# Patient Record
Sex: Female | Born: 1950 | Race: White | Hispanic: No | Marital: Married | Smoking: Never smoker
Health system: Southern US, Community
[De-identification: ages and names within clinical notes are randomized; demographics above are authoritative.]

## PROBLEM LIST (undated history)

## (undated) DIAGNOSIS — F329 Major depressive disorder, single episode, unspecified: Secondary | ICD-10-CM

## (undated) DIAGNOSIS — G8929 Other chronic pain: Secondary | ICD-10-CM

## (undated) DIAGNOSIS — T8859XA Other complications of anesthesia, initial encounter: Secondary | ICD-10-CM

## (undated) DIAGNOSIS — D869 Sarcoidosis, unspecified: Secondary | ICD-10-CM

## (undated) DIAGNOSIS — C801 Malignant (primary) neoplasm, unspecified: Secondary | ICD-10-CM

## (undated) DIAGNOSIS — K219 Gastro-esophageal reflux disease without esophagitis: Secondary | ICD-10-CM

## (undated) DIAGNOSIS — R51 Headache: Secondary | ICD-10-CM

## (undated) DIAGNOSIS — G43909 Migraine, unspecified, not intractable, without status migrainosus: Secondary | ICD-10-CM

## (undated) DIAGNOSIS — R112 Nausea with vomiting, unspecified: Secondary | ICD-10-CM

## (undated) DIAGNOSIS — R519 Headache, unspecified: Secondary | ICD-10-CM

## (undated) DIAGNOSIS — E7800 Pure hypercholesterolemia, unspecified: Secondary | ICD-10-CM

## (undated) DIAGNOSIS — K449 Diaphragmatic hernia without obstruction or gangrene: Secondary | ICD-10-CM

## (undated) DIAGNOSIS — R011 Cardiac murmur, unspecified: Secondary | ICD-10-CM

## (undated) DIAGNOSIS — Z9889 Other specified postprocedural states: Secondary | ICD-10-CM

## (undated) DIAGNOSIS — F32A Depression, unspecified: Secondary | ICD-10-CM

## (undated) HISTORY — DX: Gastro-esophageal reflux disease without esophagitis: K21.9

## (undated) HISTORY — DX: Pure hypercholesterolemia, unspecified: E78.00

## (undated) HISTORY — DX: Sarcoidosis, unspecified: D86.9

## (undated) HISTORY — PX: PARTIAL HYSTERECTOMY: SHX80

## (undated) HISTORY — DX: Depression, unspecified: F32.A

## (undated) HISTORY — DX: Gastro-esophageal reflux disease without esophagitis: K44.9

## (undated) HISTORY — DX: Migraine, unspecified, not intractable, without status migrainosus: G43.909

## (undated) HISTORY — PX: ABDOMINAL HYSTERECTOMY: SHX81

## (undated) HISTORY — DX: Headache: R51

## (undated) HISTORY — DX: Major depressive disorder, single episode, unspecified: F32.9

## (undated) HISTORY — DX: Other chronic pain: G89.29

## (undated) HISTORY — PX: OTHER SURGICAL HISTORY: SHX169

## (undated) HISTORY — DX: Headache, unspecified: R51.9

## (undated) HISTORY — PX: EXCISION VAGINAL CYST: SHX5825

---

## 2004-01-10 ENCOUNTER — Ambulatory Visit: Payer: Self-pay | Admitting: Internal Medicine

## 2004-01-24 ENCOUNTER — Ambulatory Visit: Payer: Self-pay | Admitting: Internal Medicine

## 2004-03-15 ENCOUNTER — Ambulatory Visit (HOSPITAL_COMMUNITY): Admission: RE | Admit: 2004-03-15 | Discharge: 2004-03-16 | Payer: Self-pay | Admitting: Cardiothoracic Surgery

## 2004-04-29 ENCOUNTER — Ambulatory Visit: Payer: Self-pay | Admitting: Internal Medicine

## 2004-11-18 ENCOUNTER — Ambulatory Visit: Payer: Self-pay | Admitting: Internal Medicine

## 2005-01-16 ENCOUNTER — Ambulatory Visit: Payer: Self-pay | Admitting: Internal Medicine

## 2005-12-15 ENCOUNTER — Encounter: Payer: Self-pay | Admitting: Urology

## 2006-01-06 ENCOUNTER — Encounter: Payer: Self-pay | Admitting: Urology

## 2006-01-21 ENCOUNTER — Ambulatory Visit: Payer: Self-pay

## 2006-02-06 ENCOUNTER — Encounter: Payer: Self-pay | Admitting: Urology

## 2007-02-17 ENCOUNTER — Ambulatory Visit: Payer: Self-pay | Admitting: Internal Medicine

## 2008-02-21 ENCOUNTER — Ambulatory Visit: Payer: Self-pay | Admitting: Internal Medicine

## 2008-10-25 ENCOUNTER — Ambulatory Visit: Payer: Self-pay | Admitting: Specialist

## 2009-02-22 ENCOUNTER — Ambulatory Visit: Payer: Self-pay

## 2009-04-18 ENCOUNTER — Ambulatory Visit: Payer: Self-pay | Admitting: Specialist

## 2009-10-17 ENCOUNTER — Ambulatory Visit: Payer: Self-pay | Admitting: Specialist

## 2010-02-26 ENCOUNTER — Ambulatory Visit: Payer: Self-pay | Admitting: Internal Medicine

## 2010-10-30 ENCOUNTER — Ambulatory Visit: Payer: Self-pay | Admitting: Specialist

## 2011-03-24 ENCOUNTER — Ambulatory Visit: Payer: Self-pay | Admitting: Internal Medicine

## 2011-03-24 LAB — HM MAMMOGRAPHY

## 2011-11-13 ENCOUNTER — Other Ambulatory Visit: Payer: Self-pay | Admitting: *Deleted

## 2011-11-13 MED ORDER — OMEPRAZOLE 20 MG PO CPDR: 20.0000 mg | DELAYED_RELEASE_CAPSULE | Freq: Two times a day (BID) | ORAL | Status: DC

## 2011-12-12 ENCOUNTER — Encounter: Payer: Self-pay | Admitting: *Deleted

## 2011-12-15 ENCOUNTER — Encounter: Payer: Self-pay | Admitting: Internal Medicine

## 2011-12-15 ENCOUNTER — Ambulatory Visit (INDEPENDENT_AMBULATORY_CARE_PROVIDER_SITE_OTHER): Payer: No Typology Code available for payment source | Admitting: Internal Medicine

## 2011-12-15 VITALS — BP 116/70 | HR 71 | Temp 98.4°F | Ht 64.0 in | Wt 173.0 lb

## 2011-12-15 DIAGNOSIS — E7800 Pure hypercholesterolemia, unspecified: Secondary | ICD-10-CM

## 2011-12-15 DIAGNOSIS — M949 Disorder of cartilage, unspecified: Secondary | ICD-10-CM

## 2011-12-15 DIAGNOSIS — M8580 Other specified disorders of bone density and structure, unspecified site: Secondary | ICD-10-CM

## 2011-12-15 DIAGNOSIS — D869 Sarcoidosis, unspecified: Secondary | ICD-10-CM

## 2011-12-15 DIAGNOSIS — R32 Unspecified urinary incontinence: Secondary | ICD-10-CM

## 2011-12-15 MED ORDER — ACYCLOVIR 400 MG PO TABS: 400.0000 mg | ORAL_TABLET | Freq: Three times a day (TID) | ORAL | Status: DC

## 2011-12-16 ENCOUNTER — Encounter: Payer: Self-pay | Admitting: Internal Medicine

## 2011-12-16 DIAGNOSIS — M8580 Other specified disorders of bone density and structure, unspecified site: Secondary | ICD-10-CM | POA: Insufficient documentation

## 2011-12-16 DIAGNOSIS — R32 Unspecified urinary incontinence: Secondary | ICD-10-CM | POA: Insufficient documentation

## 2011-12-16 DIAGNOSIS — D869 Sarcoidosis, unspecified: Secondary | ICD-10-CM | POA: Insufficient documentation

## 2011-12-16 DIAGNOSIS — E7800 Pure hypercholesterolemia, unspecified: Secondary | ICD-10-CM | POA: Insufficient documentation

## 2011-12-16 NOTE — Assessment & Plan Note (Signed)
On Vesicare.  Seeing Dr Ellene Route.  Follow.

## 2011-12-16 NOTE — Assessment & Plan Note (Signed)
Discussed treatment options with her.  Discussed cholesterol medication and possible side effects.  Will recheck lipid profile.  Low cholesterol/low carb diet.  Will follow.

## 2011-12-16 NOTE — Progress Notes (Signed)
  Subjective:    [name], female    DOB: May 13, 1950, 61 y.o.   MRN: [id number]  HPI 61 year old female with past history of GERD (with hiatal hernia), migraine headaches, hypercholesterolemia and sarcoidosis (followed by Dr Meredeth Ide) who comes in today for a scheduled follow up.  She states she is doing relatively well.  She is concerned regarding a toe nail fungus.  Just involves her right great toe.  She also had a lot of questions regarding increased cholesterol.  We discussed treatment options and importance of diet and exercise.  No chest pain or tightness.  Breathing stable.  Still following with Dr Meredeth Ide.  Due a follow up appt with him 4/14.  She does have a painful, burning and itchy rash on her right buttock.  Present for approximately one month.   Past Medical History  Diagnosis Date  . Migraines   . Hiatal hernia with gastroesophageal reflux   . Depression   . Sarcoidosis   . Pure hypercholesterolemia     Review of Systems Patient denies any headache, lightheadedness or dizziness.  No significant sinus or allergy symptoms.   No chest pain, tightness or palpitations.  No increased shortness of breath, cough or congestion.  No nausea or vomiting.  No abdominal pain or cramping.  No bowel change, such as diarrhea, constipation, BRBPR or melana.  No urine change.        Objective:   Physical Exam Filed Vitals:   12/15/11 1525  BP: 116/70  Pulse: 71  Temp: 98.4 F (30.22 C)   61 year old female in no acute distress.   HEENT:  Nares - clear.  OP- without lesions or erythema.  NECK:  Supple, nontender.  No audible bruit.   HEART:  Appears to be regular. LUNGS:  Without crackles or wheezing audible.  Respirations even and unlabored.   RADIAL PULSE:  Equal bilaterally.  ABDOMEN:  Soft, nontender.  No audible abdominal bruit.   EXTREMITIES:  No increased edema to be present.  Toe nail fungus - right great toe.   SKIN:  Erythematous based lesions (two circular) -  right buttock.  Appears to be c/w herpetic lesions.     Assessment & Plan:  RASH.  Appears to be consistent with herpetic lesions.  Treat with Acyclovir as directed.  Notify me if it does not resolve.    TOE NAIL FUNGUS.  We discussed treatment options including Lamisil, podiatry referral, etc.  She wants to follow for now.  Will notify me if desires medication.   INCREASED PSYCHOSOCIAL STRESSORS.  On lexapro and doing well.  Follow.   GYN.  Sees Dr Luella Cook.  He does her pelvics and pap smears.  States she is up to date.    CARDIOVASCULAR.  Asymptomatic.  Continue risk factor modification.   GI.  Colonoscopy 05/19/03 - normal.  Upper symptoms controlled.   HEALTH MAINTENANCE.  Physical 03/11/11.  Breasts, pelvics and pap smears are done through GYN.  Mammogram 03/24/11 - BiRADS II.  Colonoscopy - outlined.

## 2011-12-16 NOTE — Assessment & Plan Note (Signed)
Continue calcium, vitamin D and weight bearing exercise.  Will follow.

## 2011-12-16 NOTE — Assessment & Plan Note (Signed)
CT scans have revealed stable pulmonary nodules as well as stable enlarged mediastinal lymph nodes.  Sees Dr Meredeth Ide.  He is following her DLCO and lung function.  She feels her breathing is stable.  Asymptomatic.  Last saw Dr Meredeth Ide 05/22/11.  CXR at that visit revealed no acute cardiopulmonary disease.

## 2012-01-02 ENCOUNTER — Other Ambulatory Visit: Payer: Self-pay | Admitting: Internal Medicine

## 2012-01-02 MED ORDER — ZOLMITRIPTAN 5 MG PO TABS: 2.5000 mg | ORAL_TABLET | ORAL | Status: DC | PRN

## 2012-01-02 NOTE — Telephone Encounter (Signed)
Called into pharmacy

## 2012-01-02 NOTE — Telephone Encounter (Signed)
Pt is needing refill on Zomig 5 mg tablets

## 2012-02-02 ENCOUNTER — Other Ambulatory Visit: Payer: Self-pay | Admitting: *Deleted

## 2012-02-05 ENCOUNTER — Other Ambulatory Visit: Payer: Self-pay | Admitting: *Deleted

## 2012-02-06 MED ORDER — OMEPRAZOLE 20 MG PO CPDR: 20.0000 mg | DELAYED_RELEASE_CAPSULE | Freq: Two times a day (BID) | ORAL | Status: DC

## 2012-02-06 NOTE — Telephone Encounter (Signed)
Sent in to pharmacy.  

## 2012-02-09 ENCOUNTER — Other Ambulatory Visit: Payer: Self-pay | Admitting: *Deleted

## 2012-02-09 NOTE — Telephone Encounter (Signed)
Needs PA for omeprazole DR 20 mg CER, take 2 daily.

## 2012-02-10 ENCOUNTER — Other Ambulatory Visit: Payer: Self-pay | Admitting: *Deleted

## 2012-02-10 MED ORDER — ESTRADIOL 1 MG PO TABS: 1.0000 mg | ORAL_TABLET | Freq: Every day | ORAL | Status: DC

## 2012-02-10 NOTE — Telephone Encounter (Signed)
Patient called wanting a refill estrace. Sent in to pharmacy.

## 2012-02-23 ENCOUNTER — Ambulatory Visit: Payer: Self-pay | Admitting: General Practice

## 2012-03-19 ENCOUNTER — Ambulatory Visit (INDEPENDENT_AMBULATORY_CARE_PROVIDER_SITE_OTHER): Payer: No Typology Code available for payment source | Admitting: Internal Medicine

## 2012-03-19 ENCOUNTER — Encounter: Payer: Self-pay | Admitting: Internal Medicine

## 2012-03-19 VITALS — BP 120/80 | HR 86 | Temp 98.2°F | Ht 64.0 in | Wt 178.5 lb

## 2012-03-19 DIAGNOSIS — M899 Disorder of bone, unspecified: Secondary | ICD-10-CM

## 2012-03-19 DIAGNOSIS — D869 Sarcoidosis, unspecified: Secondary | ICD-10-CM

## 2012-03-19 DIAGNOSIS — K219 Gastro-esophageal reflux disease without esophagitis: Secondary | ICD-10-CM

## 2012-03-19 DIAGNOSIS — M949 Disorder of cartilage, unspecified: Secondary | ICD-10-CM

## 2012-03-19 DIAGNOSIS — M8580 Other specified disorders of bone density and structure, unspecified site: Secondary | ICD-10-CM

## 2012-03-19 DIAGNOSIS — N951 Menopausal and female climacteric states: Secondary | ICD-10-CM

## 2012-03-19 DIAGNOSIS — E7800 Pure hypercholesterolemia, unspecified: Secondary | ICD-10-CM

## 2012-03-19 DIAGNOSIS — R7989 Other specified abnormal findings of blood chemistry: Secondary | ICD-10-CM

## 2012-03-19 DIAGNOSIS — R32 Unspecified urinary incontinence: Secondary | ICD-10-CM

## 2012-03-19 DIAGNOSIS — R945 Abnormal results of liver function studies: Secondary | ICD-10-CM

## 2012-03-19 DIAGNOSIS — Z1239 Encounter for other screening for malignant neoplasm of breast: Secondary | ICD-10-CM

## 2012-03-21 ENCOUNTER — Encounter: Payer: Self-pay | Admitting: Internal Medicine

## 2012-03-21 ENCOUNTER — Telehealth: Payer: Self-pay | Admitting: Internal Medicine

## 2012-03-21 DIAGNOSIS — N951 Menopausal and female climacteric states: Secondary | ICD-10-CM | POA: Insufficient documentation

## 2012-03-21 DIAGNOSIS — K219 Gastro-esophageal reflux disease without esophagitis: Secondary | ICD-10-CM | POA: Insufficient documentation

## 2012-03-21 NOTE — Assessment & Plan Note (Signed)
Previously noted.  Most recent check wnl.  Follow.

## 2012-03-21 NOTE — Assessment & Plan Note (Signed)
Last bone density revealed osteopenia.  Calcium and vitamin D and weight bearing exercise.  Follow.

## 2012-03-21 NOTE — Progress Notes (Signed)
Subjective:    [name], female    DOB: 06/03/1950, 62 y.o.   MRN: [id number]  HPI 61 year old female with past history of hiatal hernia, depression, migraine headaches, hypercholesterolemia and sarcoidosis (followed by Dr Margaretha Glassing).  She comes in today to follow up on these issues as well as for her complete physical exam.  She states she is doing relatively well.  No chest pain or tightness.  No increased sob, cough or congestion.  No acid reflux.  Bowels stable.  Is not exercising and not watching what she eats.  Discussed diet and exercise today.  Cholesterol elevated.  Has no desire to take cholesterol medication.     Past Medical History  Diagnosis Date  . Migraines   . Hiatal hernia with gastroesophageal reflux   . Depression   . Sarcoidosis   . Hypercholesterolemia     Current Outpatient Prescriptions on File Prior to Visit  Medication Sig Dispense Refill  . acyclovir (ZOVIRAX) 400 MG tablet Take 1 tablet (400 mg total) by mouth 3 (three) times daily.  30 tablet  0  . Ascorbic Acid (VITAMIN C PO) Take by mouth.      . Calcium Carb-Cholecalciferol (CALCIUM 600+D3) 600-200 MG-UNIT TABS Take by mouth.      . escitalopram (LEXAPRO) 10 MG tablet Take 10 mg by mouth daily.      Marland Kitchen estradiol (ESTRACE) 1 MG tablet Take 1 tablet (1 mg total) by mouth daily.  30 tablet  5  . fish oil-omega-3 fatty acids 1000 MG capsule Take 1 g by mouth 3 (three) times daily.      . mometasone (NASONEX) 50 MCG/ACT nasal spray Place 2 sprays into the nose daily.      . Multiple Vitamins-Minerals (MULTIVITAMIN PO) Take by mouth.      Marland Kitchen omeprazole (PRILOSEC) 20 MG capsule Take 1 capsule (20 mg total) by mouth 2 (two) times daily.  60 capsule  5  . progesterone (PROMETRIUM) 100 MG capsule Take 100 mg by mouth at bedtime.      . solifenacin (VESICARE) 10 MG tablet Take 10 mg by mouth.      Marland Kitchen VITAMIN E PO Take by mouth.      . zolmitriptan (ZOMIG) 5 MG tablet Take 0.5 tablets (2.5 mg total) by  mouth as needed.  10 tablet  2  . estrogens, conjugated, (PREMARIN) 0.625 MG tablet Take 0.625 mg by mouth daily.        No current facility-administered medications on file prior to visit.    Review of Systems Patient denies any significant headaches.  No lightheadedness or dizziness.  No sinus or allergy symptoms.  No chest pain, tightness or palpitations.  No increased shortness of breath, cough or congestion.  No nausea or vomiting.  No acid reflux.  No abdominal pain or cramping.  No bowel change, such as diarrhea, constipation, BRBPR or melana.  No urine change.   Discussed diet and exercise.       Objective:   Physical Exam Filed Vitals:   03/19/12 1555  BP: 120/80  Pulse: 86  Temp: 98.2 F (71.83 C)   62 year old female in no acute distress.   HEENT:  Nares- clear.  Oropharynx - without lesions. NECK:  Supple.  Nontender.  No audible bruit.  HEART:  Appears to be regular. LUNGS:  No crackles or wheezing audible.  Respirations even and unlabored.  RADIAL PULSE:  Equal bilaterally.    BREASTS:  No  nipple discharge or nipple retraction present.  Could not appreciate any distinct nodules or axillary adenopathy.  ABDOMEN:  Soft, nontender.  Bowel sounds present and normal.  No audible abdominal bruit.  GU: performed by GYN.    EXTREMITIES:  No increased edema present.  DP pulses palpable and equal bilaterally.      SKIN:  No rash.       Assessment & Plan:  MSK.  No report of back pain.  Follow.   GYN.  Sees gyn for her pelvic and pap smears.  States she is up to date.  Follow.    CARDIOVASCULAR.  Asymptomatic.    GI.  Colonoscopy 05/19/03 - normal.  Follow.   INCREASED PSYCHOSOCIAL STRESSORS.  On Lexapro.  Follow.    HEALTH MAINTENANCE.  Physical today.  Breasts, pelvic and pap smears through gyn.  States she is up to date.  Mammogram 03/24/11 - Birads II.  Schedule a follow up mammogram.  Colonoscopy 05/19/03 - normal.

## 2012-03-21 NOTE — Assessment & Plan Note (Signed)
Seeing Dr Ellen Wells.  Doing better.   Follow.   

## 2012-03-21 NOTE — Telephone Encounter (Signed)
Needs a follow up appt in 4 months.  (30 min).  Thanks.

## 2012-03-21 NOTE — Assessment & Plan Note (Signed)
Followed by Dr Meredeth Ide.  CT scans have revealed stable pulmonary nodules as well as stable enlarged mediastinal lymph nodes.  He is following her DLCO and lung function.  Breathing is stable.

## 2012-03-21 NOTE — Assessment & Plan Note (Signed)
Recent cholesterol elevated.  Discussed medication.  She declines.  Low cholesterol diet and exercise.  Will follow.

## 2012-03-21 NOTE — Assessment & Plan Note (Signed)
Doing better.  Follow.   

## 2012-03-21 NOTE — Assessment & Plan Note (Signed)
Upper symptoms controlled on omeprazole.  Follow.   

## 2012-03-22 NOTE — Telephone Encounter (Signed)
Left message to call office please schedule appointment

## 2012-03-22 NOTE — Telephone Encounter (Signed)
scheduled

## 2012-03-28 ENCOUNTER — Telehealth: Payer: Self-pay | Admitting: Internal Medicine

## 2012-03-28 MED ORDER — ACYCLOVIR 400 MG PO TABS: 400.0000 mg | ORAL_TABLET | Freq: Three times a day (TID) | ORAL | Status: DC

## 2012-03-28 NOTE — Telephone Encounter (Signed)
Refilled zoviraz

## 2012-04-21 NOTE — Telephone Encounter (Signed)
Please close encounter

## 2012-04-29 ENCOUNTER — Ambulatory Visit: Payer: Self-pay | Admitting: Internal Medicine

## 2012-05-17 ENCOUNTER — Encounter: Payer: Self-pay | Admitting: Internal Medicine

## 2012-05-17 ENCOUNTER — Ambulatory Visit (INDEPENDENT_AMBULATORY_CARE_PROVIDER_SITE_OTHER): Payer: No Typology Code available for payment source | Admitting: Internal Medicine

## 2012-05-17 VITALS — BP 110/80 | HR 66 | Temp 98.2°F | Ht 64.0 in

## 2012-05-17 DIAGNOSIS — N951 Menopausal and female climacteric states: Secondary | ICD-10-CM

## 2012-05-17 MED ORDER — VENLAFAXINE HCL ER 37.5 MG PO CP24: ORAL_CAPSULE | ORAL | Status: DC

## 2012-05-17 NOTE — Progress Notes (Signed)
Subjective:    [name], female    DOB: 1950/08/13, 62 y.o.   MRN: [id number]  HPI 62 year old female with past history of hiatal hernia, depression, migraine headaches, hypercholesterolemia and sarcoidosis (followed by Dr Margaretha Glassing).  She comes in today as a work in with concerns regarding not being able to concentrate and focus as well.  She is not sleeping well.  Persistent hot flashes.  Quick temper.  Moody.  Does not feel well.  Weight gain.  Has started exercising.  Frustrated because she is gaining weight.      Past Medical History  Diagnosis Date  . Migraines   . Hiatal hernia with gastroesophageal reflux   . Depression   . Sarcoidosis   . Hypercholesterolemia     Current Outpatient Prescriptions on File Prior to Visit  Medication Sig Dispense Refill  . acyclovir (ZOVIRAX) 400 MG tablet Take 1 tablet (400 mg total) by mouth 3 (three) times daily.  30 tablet  0  . Ascorbic Acid (VITAMIN C PO) Take by mouth.      . Calcium Carb-Cholecalciferol (CALCIUM 600+D3) 600-200 MG-UNIT TABS Take by mouth.      . estradiol (ESTRACE) 1 MG tablet Take 1 tablet (1 mg total) by mouth daily.  30 tablet  5  . fish oil-omega-3 fatty acids 1000 MG capsule Take 1 g by mouth 3 (three) times daily.      . mometasone (NASONEX) 50 MCG/ACT nasal spray Place 2 sprays into the nose daily.      . Multiple Vitamins-Minerals (MULTIVITAMIN PO) Take by mouth.      Marland Kitchen omeprazole (PRILOSEC) 20 MG capsule Take 1 capsule (20 mg total) by mouth 2 (two) times daily.  60 capsule  5  . solifenacin (VESICARE) 10 MG tablet Take 10 mg by mouth.      Marland Kitchen VITAMIN E PO Take by mouth.      . zolmitriptan (ZOMIG) 5 MG tablet Take 0.5 tablets (2.5 mg total) by mouth as needed.  10 tablet  2  . estrogens, conjugated, (PREMARIN) 0.625 MG tablet Take 0.625 mg by mouth daily.       . progesterone (PROMETRIUM) 100 MG capsule Take 100 mg by mouth at bedtime.       No current facility-administered medications on file  prior to visit.    Review of Systems Patient denies any significant headaches.  No lightheadedness or dizziness.  No sinus or allergy symptoms.  No chest pain, tightness or palpitations.  No increased shortness of breath.  Increased hot flashes.  Not sleeping well.  Unable to focus and concentrate.  Was questioning ADHD diagnosis.  She is no Estradiol.        Objective:   Physical Exam  Filed Vitals:   05/17/12 1534  BP: 110/80  Pulse: 66  Temp: 98.2 F (70.25 C)   62 year old female in no acute distress.   Physical exam not performed given the nature of this visit.       Assessment & Plan:  MSK.  No report of back pain.  Follow.   GYN.  Sees gyn for her pelvic and pap smears.  States she is up to date.  Follow.    CARDIOVASCULAR.  Asymptomatic.    GI.  Colonoscopy 05/19/03 - normal.  Follow.   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  Off lexapro.  Discussed at length with her today.  Will start effexor xr 37.5mg  q day x 1 week  and then increase to 75mg  q day.  Get her back in soon to reassess.  Follow closely.      HEALTH MAINTENANCE.  Last visit.  Breasts, pelvic and pap smears through gyn.  States she is up to date.  Mammogram 03/24/11 - Birads II.  Scheduled a follow up mammogram.  Need results.  Colonoscopy 05/19/03 - normal.

## 2012-05-17 NOTE — Assessment & Plan Note (Signed)
Symptoms as outlined.  Start Effexor XR 37.5 mg q day x 1 week and then 75mg  q day.  Follow.  Get her back in soon to reassess.

## 2012-05-24 ENCOUNTER — Encounter: Payer: Self-pay | Admitting: Internal Medicine

## 2012-06-08 ENCOUNTER — Other Ambulatory Visit: Payer: Self-pay | Admitting: Internal Medicine

## 2012-06-08 NOTE — Telephone Encounter (Signed)
Please Advise.....  Patient does have appointment on 06/17/12. I do not see that patient is currently taking these medications.

## 2012-06-08 NOTE — Telephone Encounter (Signed)
Patient calling and stating that she had (June-July 2013) been taking flexeril and meloxicam from a prior visit with Dr. Lorin Picket @ [place]. Patient states she wants these today, her lower back has been hurting for 4 days, this isn't the first time she has had this issue with her back.

## 2012-06-08 NOTE — Telephone Encounter (Signed)
I am not aware that she is taking these medications.  Please clarify with pt - why on and who filled.  Thanks.

## 2012-06-08 NOTE — Telephone Encounter (Signed)
Spoke to pt and clarified symptoms.  Is having a flare with her back again.  Same as last year.  Not as bad yet.  No abdominal pain, fever, vomiting or other acute sx.  Took meloxicam and flexeril last year and tolerated.  Refilled meloxicam 15mg  #20 with no refills and flexeril 5mg  #15 with no refills.  Sent in to [facility] [address]

## 2012-06-17 ENCOUNTER — Encounter: Payer: Self-pay | Admitting: Internal Medicine

## 2012-06-17 ENCOUNTER — Ambulatory Visit (INDEPENDENT_AMBULATORY_CARE_PROVIDER_SITE_OTHER): Payer: No Typology Code available for payment source | Admitting: Internal Medicine

## 2012-06-17 VITALS — BP 120/80 | HR 78 | Temp 98.0°F | Ht 64.0 in | Wt 175.2 lb

## 2012-06-17 DIAGNOSIS — N951 Menopausal and female climacteric states: Secondary | ICD-10-CM

## 2012-06-17 DIAGNOSIS — E7800 Pure hypercholesterolemia, unspecified: Secondary | ICD-10-CM

## 2012-06-17 DIAGNOSIS — R32 Unspecified urinary incontinence: Secondary | ICD-10-CM

## 2012-06-17 DIAGNOSIS — K219 Gastro-esophageal reflux disease without esophagitis: Secondary | ICD-10-CM

## 2012-06-17 DIAGNOSIS — D869 Sarcoidosis, unspecified: Secondary | ICD-10-CM

## 2012-06-17 MED ORDER — VENLAFAXINE HCL ER 75 MG PO CP24: 75.0000 mg | ORAL_CAPSULE | Freq: Every day | ORAL | Status: DC

## 2012-06-17 MED ORDER — ACYCLOVIR 400 MG PO TABS: 400.0000 mg | ORAL_TABLET | Freq: Three times a day (TID) | ORAL | Status: DC

## 2012-06-27 ENCOUNTER — Encounter: Payer: Self-pay | Admitting: Internal Medicine

## 2012-06-27 NOTE — Assessment & Plan Note (Signed)
Recent cholesterol elevated.  Have discussed medication.  She has declined.  Low cholesterol diet and exercise.  Will follow.

## 2012-06-27 NOTE — Assessment & Plan Note (Signed)
Upper symptoms controlled on omeprazole.  Follow.   

## 2012-06-27 NOTE — Assessment & Plan Note (Signed)
Seeing Dr Ellen Wells.  Doing better.   Follow.   

## 2012-06-27 NOTE — Assessment & Plan Note (Signed)
Followed by Dr Meredeth Ide.  CT scans have revealed stable pulmonary nodules as well as stable enlarged mediastinal lymph nodes.  He is following her DLCO and lung function.  Breathing is stable.

## 2012-06-27 NOTE — Progress Notes (Signed)
Subjective:    [name], female    DOB: 1950/08/01, 62 y.o.   MRN: [id number]  HPI 62 year old female with past history of hiatal hernia, depression, migraine headaches, hypercholesterolemia and sarcoidosis (followed by Dr Margaretha Glassing).  She comes in today for a scheduled follow up.  Last visit was having problems with hot flashes and mood changes, etc.  Started on Effexor.  She states she is doing better.  Feels better.  Sleeping better.  Has started exercising.      Past Medical History  Diagnosis Date  . Migraines   . Hiatal hernia with gastroesophageal reflux   . Depression   . Sarcoidosis   . Hypercholesterolemia     Current Outpatient Prescriptions on File Prior to Visit  Medication Sig Dispense Refill  . Calcium Carb-Cholecalciferol (CALCIUM 600+D3) 600-200 MG-UNIT TABS Take by mouth.      . estradiol (ESTRACE) 1 MG tablet Take 1 tablet (1 mg total) by mouth daily.  30 tablet  5  . fish oil-omega-3 fatty acids 1000 MG capsule Take 1 g by mouth 3 (three) times daily.      . meloxicam (MOBIC) 15 MG tablet TAKE ONE TABLET BY MOUTH EVERY DAY AS NEEDED  20 tablet  0  . mometasone (NASONEX) 50 MCG/ACT nasal spray Place 2 sprays into the nose daily.      . Multiple Vitamins-Minerals (MULTIVITAMIN PO) Take by mouth.      Marland Kitchen omeprazole (PRILOSEC) 20 MG capsule Take 1 capsule (20 mg total) by mouth 2 (two) times daily.  60 capsule  5  . zolmitriptan (ZOMIG) 5 MG tablet Take 0.5 tablets (2.5 mg total) by mouth as needed.  10 tablet  2  . Ascorbic Acid (VITAMIN C PO) Take by mouth.      . cyclobenzaprine (FLEXERIL) 5 MG tablet TAKE ONE TABLET BY MOUTH AT BEDTIME AS NEEDED  15 tablet  0  . estrogens, conjugated, (PREMARIN) 0.625 MG tablet Take 0.625 mg by mouth daily.       . progesterone (PROMETRIUM) 100 MG capsule Take 100 mg by mouth at bedtime.      . solifenacin (VESICARE) 10 MG tablet Take 10 mg by mouth.      Marland Kitchen VITAMIN E PO Take by mouth.       No current  facility-administered medications on file prior to visit.    Review of Systems Patient denies any significant headaches.  No lightheadedness or dizziness.  No sinus or allergy symptoms.  No chest pain, tightness or palpitations.  No increased shortness of breath.  On Effexor.  Doing better.  Hot flashes better.  Sleeping better.  Feels better.  Mood is better.        Objective:   Physical Exam  Filed Vitals:   06/17/12 1622  BP: 120/80  Pulse: 78  Temp: 98 F (73.79 C)   63 year old female in no acute distress.   HEENT:  Nares- clear.  Oropharynx - without lesions. NECK:  Supple.  Nontender.  No audible bruit.  HEART:  Appears to be regular. LUNGS:  No crackles or wheezing audible.  Respirations even and unlabored.  RADIAL PULSE:  Equal bilaterally.  ABDOMEN:  Soft, nontender.  Bowel sounds present and normal.  No audible abdominal bruit.  EXTREMITIES:  No increased edema present.  DP pulses palpable and equal bilaterally.          Assessment & Plan:  MSK.  No report of back pain.  Follow.   GYN.  Sees gyn for her pelvic and pap smears.  States she is up to date.  Follow.    CARDIOVASCULAR.  Asymptomatic.    GI.  Colonoscopy 05/19/03 - normal.  Follow.   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  On Effexor 37.5mg  q day now.  Never increased effexor to 75 mg q day.  Feeling better now.  Will increase effexor to 75 mg q day.  Follow.  Notify me if problems.        HEALTH MAINTENANCE.  Breasts, pelvic and pap smears through gyn.  States she is up to date.  Mammogram 03/24/11 - Birads II.  Mammogram 04/29/12 - Birads I.  Colonoscopy 05/19/03 - normal.

## 2012-06-27 NOTE — Assessment & Plan Note (Signed)
Doing better.  On Effexor 37.5mg  q day.  Will increase Effexor to 75mg  q day.  Follow.

## 2012-07-22 ENCOUNTER — Ambulatory Visit: Payer: No Typology Code available for payment source | Admitting: Internal Medicine

## 2012-08-26 ENCOUNTER — Ambulatory Visit: Payer: No Typology Code available for payment source | Admitting: Internal Medicine

## 2012-09-06 ENCOUNTER — Encounter: Payer: Self-pay | Admitting: Internal Medicine

## 2012-09-08 ENCOUNTER — Encounter: Payer: Self-pay | Admitting: Internal Medicine

## 2012-09-08 ENCOUNTER — Ambulatory Visit (INDEPENDENT_AMBULATORY_CARE_PROVIDER_SITE_OTHER): Payer: No Typology Code available for payment source | Admitting: Internal Medicine

## 2012-09-08 VITALS — BP 110/80 | HR 74 | Temp 98.1°F | Ht 64.0 in | Wt 178.0 lb

## 2012-09-08 DIAGNOSIS — K219 Gastro-esophageal reflux disease without esophagitis: Secondary | ICD-10-CM

## 2012-09-08 MED ORDER — CYCLOBENZAPRINE HCL 5 MG PO TABS: 5.0000 mg | ORAL_TABLET | Freq: Every evening | ORAL | Status: DC | PRN

## 2012-09-11 ENCOUNTER — Encounter: Payer: Self-pay | Admitting: Internal Medicine

## 2012-09-11 NOTE — Progress Notes (Signed)
Subjective:    [name], female    DOB: 02-02-1950, 62 y.o.   MRN: [id number]  Headache   62 year old female with past history of hiatal hernia, depression, migraine headaches, hypercholesterolemia and sarcoidosis (followed by Dr Margaretha Glassing).  She comes in today as a work in with concerns regarding headache.  Has a history of migraine headaches.  Usually well controlled.  Recently has noticed some increased headache.  Has to lie down.  She describes the headache as occurring in the posterior region of her head and bilateral temples.  Increased tension and stress.  No sleeping as well.  No sinus tenderness or congestion.  Some nausea.  Some acid reflux.  No bowel change.       Past Medical History  Diagnosis Date  . Migraines   . Hiatal hernia with gastroesophageal reflux   . Depression   . Sarcoidosis   . Hypercholesterolemia     Current Outpatient Prescriptions on File Prior to Visit  Medication Sig Dispense Refill  . estradiol (ESTRACE) 1 MG tablet Take 1 tablet (1 mg total) by mouth daily.  30 tablet  5  . fish oil-omega-3 fatty acids 1000 MG capsule Take 1 g by mouth 3 (three) times daily.      . mometasone (NASONEX) 50 MCG/ACT nasal spray Place 2 sprays into the nose daily.      Marland Kitchen omeprazole (PRILOSEC) 20 MG capsule Take 1 capsule (20 mg total) by mouth 2 (two) times daily.  60 capsule  5  . venlafaxine XR (EFFEXOR XR) 75 MG 24 hr capsule Take 1 capsule (75 mg total) by mouth daily.  30 capsule  3  . zolmitriptan (ZOMIG) 5 MG tablet Take 0.5 tablets (2.5 mg total) by mouth as needed.  10 tablet  2   No current facility-administered medications on file prior to visit.    Review of Systems  Neurological: Positive for headaches.  Headaches as outlined.  No lightheadedness or dizziness.  No sinus or allergy symptoms.  No chest pain, tightness or palpitations.  No increased shortness of breath.  On Effexor.  Doing some better on the effexor.   Not sleeping well.   Increased stress and tension.  Some neck issues.  Also with increased acid reflux.   No vomiting.  Bowels stable.   Does report some low back pain.  Started within the last 1-2 days.         Objective:   Physical Exam  Filed Vitals:   09/08/12 0938  BP: 110/80  Pulse: 74  Temp: 98.1 F (14.61 C)   62 year old female in no acute distress.   HEENT:  Nares- clear.  Oropharynx - without lesions. NECK:  Supple.  Nontender.  No audible bruit.  HEART:  Appears to be regular. LUNGS:  No crackles or wheezing audible.  Respirations even and unlabored.  RADIAL PULSE:  Equal bilaterally.  ABDOMEN:  Soft, nontender.  Bowel sounds present and normal.  No audible abdominal bruit.  EXTREMITIES:  No increased edema present.  DP pulses palpable and equal bilaterally.          Assessment & Plan:  HEADACHE.  Probably multifactorial.  Treat her neck and back issues with flexeril  q hs prn.  Tylenol as directed.  No sinus symptoms.  Treat acid reflux.  Follow.  Discussed further w/up.  Will follow.  Hold on further w/up or scanning at this time.  If headaches persist, will pursue further w/up.  MSK.  Low back pain.  Flexeril as outlined. Follow.  Tylenol prn.    GYN.  Sees gyn for her pelvic and pap smears.  States she is up to date.  Follow.    CARDIOVASCULAR.  Asymptomatic.    GI.  Colonoscopy 05/19/03 - normal.  Follow.   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  On effexor.  Stable.  Follow.          HEALTH MAINTENANCE.  Breasts, pelvic and pap smears through gyn.  States she is up to date.  Mammogram 03/24/11 - Birads II.  Mammogram 04/29/12 - Birads I.  Colonoscopy 05/19/03 - normal.

## 2012-09-11 NOTE — Assessment & Plan Note (Addendum)
On prilosec.  Increased acid reflux despite taking prilosec q day.    Will increase prilosec to bid.  Avoid eating late.  Follow.  Get her back in soon to reassess.  Also, refer to GI for evaluation.

## 2012-09-21 ENCOUNTER — Ambulatory Visit: Payer: Self-pay | Admitting: Internal Medicine

## 2012-09-23 ENCOUNTER — Ambulatory Visit (INDEPENDENT_AMBULATORY_CARE_PROVIDER_SITE_OTHER): Payer: No Typology Code available for payment source | Admitting: Internal Medicine

## 2012-09-23 ENCOUNTER — Encounter: Payer: Self-pay | Admitting: Internal Medicine

## 2012-09-23 VITALS — BP 120/80 | HR 87 | Temp 98.4°F | Ht 64.0 in | Wt 178.0 lb

## 2012-09-23 DIAGNOSIS — R945 Abnormal results of liver function studies: Secondary | ICD-10-CM

## 2012-09-23 DIAGNOSIS — R7989 Other specified abnormal findings of blood chemistry: Secondary | ICD-10-CM

## 2012-09-23 DIAGNOSIS — D869 Sarcoidosis, unspecified: Secondary | ICD-10-CM

## 2012-09-23 DIAGNOSIS — N951 Menopausal and female climacteric states: Secondary | ICD-10-CM

## 2012-09-23 DIAGNOSIS — K219 Gastro-esophageal reflux disease without esophagitis: Secondary | ICD-10-CM

## 2012-09-23 DIAGNOSIS — M8580 Other specified disorders of bone density and structure, unspecified site: Secondary | ICD-10-CM

## 2012-09-23 DIAGNOSIS — M899 Disorder of bone, unspecified: Secondary | ICD-10-CM

## 2012-09-23 DIAGNOSIS — E7800 Pure hypercholesterolemia, unspecified: Secondary | ICD-10-CM

## 2012-09-24 ENCOUNTER — Encounter: Payer: Self-pay | Admitting: Internal Medicine

## 2012-09-26 ENCOUNTER — Telehealth: Payer: Self-pay | Admitting: Internal Medicine

## 2012-09-26 ENCOUNTER — Encounter: Payer: Self-pay | Admitting: Internal Medicine

## 2012-09-26 NOTE — Progress Notes (Signed)
Subjective:    [name], female    DOB: Jan 08, 1950, 62 y.o.   MRN: [id number]  HPI 62 year old female with past history of hiatal hernia, depression, migraine headaches, hypercholesterolemia and sarcoidosis (followed by Dr Margaretha Glassing).  She comes in today for a scheduled follow up.  Last visit was having problems with increased headaches.  See last note for details.  Headaches better now.   On Effexor.  Doing better on the effexor. Still does not feel good.  Not exercising.  Not watching what she eats.  Denies depression.  Increased triglycerides.  Discussed diet and exercise.      Past Medical History  Diagnosis Date  . Migraines   . Hiatal hernia with gastroesophageal reflux   . Depression   . Sarcoidosis   . Hypercholesterolemia     Current Outpatient Prescriptions on File Prior to Visit  Medication Sig Dispense Refill  . cyclobenzaprine (FLEXERIL) 5 MG tablet Take 1 tablet (5 mg total) by mouth at bedtime as needed for muscle spasms.  20 tablet  0  . estradiol (ESTRACE) 1 MG tablet Take 1 tablet (1 mg total) by mouth daily.  30 tablet  5  . fish oil-omega-3 fatty acids 1000 MG capsule Take 1 g by mouth 3 (three) times daily.      . mometasone (NASONEX) 50 MCG/ACT nasal spray Place 2 sprays into the nose daily.      Marland Kitchen omeprazole (PRILOSEC) 20 MG capsule Take 1 capsule (20 mg total) by mouth 2 (two) times daily.  60 capsule  5  . venlafaxine XR (EFFEXOR XR) 75 MG 24 hr capsule Take 1 capsule (75 mg total) by mouth daily.  30 capsule  3  . zolmitriptan (ZOMIG) 5 MG tablet Take 0.5 tablets (2.5 mg total) by mouth as needed.  10 tablet  2   No current facility-administered medications on file prior to visit.    Review of Systems Headaches better.   No lightheadedness or dizziness.  No sinus or allergy symptoms.  No chest pain, tightness or palpitations.  No increased shortness of breath.  On Effexor.  Doing some better.  Hot flashes better.  Sleeping better.  Feels better.   Increased fatigue.  Not exercising.  Not watching what she eats.        Objective:   Physical Exam  Filed Vitals:   09/23/12 1617  BP: 120/80  Pulse: 87  Temp: 98.4 F (21.62 C)   62 year old female in no acute distress.   HEENT:  Nares- clear.  Oropharynx - without lesions. NECK:  Supple.  Nontender.  No audible bruit.  HEART:  Appears to be regular. LUNGS:  No crackles or wheezing audible.  Respirations even and unlabored.  RADIAL PULSE:  Equal bilaterally.  ABDOMEN:  Soft, nontender.  Bowel sounds present and normal.  No audible abdominal bruit.  EXTREMITIES:  No increased edema present.  DP pulses palpable and equal bilaterally.          Assessment & Plan:  MSK.  Back better.     GYN.  Sees gyn for her pelvic and pap smears.  States she is up to date.  Follow.    CARDIOVASCULAR.  Asymptomatic.    GI.  Colonoscopy 05/19/03 - normal.  Follow.   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  On Effexor.  Follow.         HEALTH MAINTENANCE.  Breasts, pelvic and pap smears through gyn.  States she  is up to date.  Mammogram 03/24/11 - Birads II.  Mammogram 04/29/12 - Birads I.  Colonoscopy 05/19/03 - normal.

## 2012-09-26 NOTE — Assessment & Plan Note (Addendum)
Last bone density revealed osteopenia.  Calcium and vitamin D and weight bearing exercise.  Follow.  Recent vitamin D level decreased.  Start vitamin D3 2000 q day.

## 2012-09-26 NOTE — Assessment & Plan Note (Signed)
Recent AST/ALT within normal limits.  Denies alcohol intake.  Follow.

## 2012-09-26 NOTE — Assessment & Plan Note (Signed)
On Effexor.  Doing better.

## 2012-09-26 NOTE — Telephone Encounter (Signed)
Pt needs a follow up appt with me in 2 months (needs ).  She usually prefers late pm appt.   Thanks

## 2012-09-26 NOTE — Assessment & Plan Note (Signed)
On prilosec. Reflux better.  Follow.     

## 2012-09-26 NOTE — Assessment & Plan Note (Signed)
Increased triglycerides.  Discussed importance of diet and exercise.  I think this will improve her fatigue as well.  [place] diet given.

## 2012-09-26 NOTE — Assessment & Plan Note (Signed)
Followed by Dr Meredeth Ide.  CT scans have revealed stable pulmonary nodules as well as stable enlarged mediastinal lymph nodes.  He is following her DLCO and lung function.  Breathing is stable.

## 2012-09-28 NOTE — Telephone Encounter (Signed)
Left message for pt to call office

## 2012-09-28 NOTE — Telephone Encounter (Signed)
I scheduled the patient for 11/24 @ 4pm.

## 2012-10-01 ENCOUNTER — Ambulatory Visit: Payer: Self-pay | Admitting: Gastroenterology

## 2012-10-11 ENCOUNTER — Encounter: Payer: Self-pay | Admitting: Internal Medicine

## 2012-10-11 DIAGNOSIS — K219 Gastro-esophageal reflux disease without esophagitis: Secondary | ICD-10-CM

## 2012-10-14 ENCOUNTER — Other Ambulatory Visit: Payer: Self-pay | Admitting: Internal Medicine

## 2012-10-21 ENCOUNTER — Encounter: Payer: Self-pay | Admitting: Internal Medicine

## 2012-10-22 ENCOUNTER — Encounter: Payer: Self-pay | Admitting: Internal Medicine

## 2012-11-11 ENCOUNTER — Other Ambulatory Visit: Payer: Self-pay

## 2012-11-22 ENCOUNTER — Encounter: Payer: Self-pay | Admitting: Internal Medicine

## 2012-11-25 ENCOUNTER — Other Ambulatory Visit: Payer: Self-pay | Admitting: Internal Medicine

## 2012-11-29 ENCOUNTER — Encounter: Payer: Self-pay | Admitting: Internal Medicine

## 2012-11-29 ENCOUNTER — Ambulatory Visit (INDEPENDENT_AMBULATORY_CARE_PROVIDER_SITE_OTHER): Payer: No Typology Code available for payment source | Admitting: Internal Medicine

## 2012-11-29 VITALS — BP 130/90 | HR 80 | Temp 98.1°F | Ht 64.0 in | Wt 179.0 lb

## 2012-11-29 DIAGNOSIS — M8580 Other specified disorders of bone density and structure, unspecified site: Secondary | ICD-10-CM

## 2012-11-29 DIAGNOSIS — R030 Elevated blood-pressure reading, without diagnosis of hypertension: Secondary | ICD-10-CM

## 2012-11-29 DIAGNOSIS — R7989 Other specified abnormal findings of blood chemistry: Secondary | ICD-10-CM

## 2012-11-29 DIAGNOSIS — R945 Abnormal results of liver function studies: Secondary | ICD-10-CM

## 2012-11-29 DIAGNOSIS — E7800 Pure hypercholesterolemia, unspecified: Secondary | ICD-10-CM

## 2012-11-29 DIAGNOSIS — K219 Gastro-esophageal reflux disease without esophagitis: Secondary | ICD-10-CM

## 2012-11-29 DIAGNOSIS — D869 Sarcoidosis, unspecified: Secondary | ICD-10-CM

## 2012-11-29 DIAGNOSIS — M899 Disorder of bone, unspecified: Secondary | ICD-10-CM

## 2012-11-29 DIAGNOSIS — N951 Menopausal and female climacteric states: Secondary | ICD-10-CM

## 2012-11-29 DIAGNOSIS — R32 Unspecified urinary incontinence: Secondary | ICD-10-CM

## 2012-11-29 NOTE — Progress Notes (Signed)
Pre-visit discussion using our clinic review tool. No additional management support is needed unless otherwise documented below in the visit note.  

## 2012-11-29 NOTE — Patient Instructions (Signed)
Saline nasal spray - flush nose at least 2-3x/day.  flonase nasal spray - 2 sprays each nostril one time per day (do in the evening).  mucinex in the am and robitussin in the evening.  Let me know if any problems.

## 2012-12-02 ENCOUNTER — Encounter: Payer: Self-pay | Admitting: Internal Medicine

## 2012-12-02 DIAGNOSIS — IMO0001 Reserved for inherently not codable concepts without codable children: Secondary | ICD-10-CM | POA: Insufficient documentation

## 2012-12-02 NOTE — Assessment & Plan Note (Signed)
Increased triglycerides.  Discussed importance of diet and exercise.  Follow.   

## 2012-12-02 NOTE — Progress Notes (Signed)
Subjective:    [name], female    DOB: December 06, 1950, 62 y.o.   MRN: [id number]  HPI 62 year old female with past history of hiatal hernia, depression, migraine headaches, hypercholesterolemia and sarcoidosis (followed by Dr Margaretha Glassing).  She comes in today for a scheduled follow up.   Headaches better.   On Effexor.  Doing better on the effexor.  Not exercising.  Not really watching what she eats.  Denies depression.  Increased triglycerides.  Discussed diet and exercise.  Did notice some congestion this am.  Just started.  No fever.  No cough or congestion.  No sob.      Past Medical History  Diagnosis Date  . Migraines   . Hiatal hernia with gastroesophageal reflux   . Depression   . Sarcoidosis   . Hypercholesterolemia     Current Outpatient Prescriptions on File Prior to Visit  Medication Sig Dispense Refill  . cyclobenzaprine (FLEXERIL) 5 MG tablet Take 1 tablet (5 mg total) by mouth at bedtime as needed for muscle spasms.  20 tablet  0  . estradiol (ESTRACE) 1 MG tablet TAKE ONE TABLET BY MOUTH EVERY DAY  30 tablet  5  . fish oil-omega-3 fatty acids 1000 MG capsule Take 1 g by mouth 3 (three) times daily.      . mometasone (NASONEX) 50 MCG/ACT nasal spray Place 2 sprays into the nose daily.      Marland Kitchen omeprazole (PRILOSEC) 20 MG capsule Take 1 capsule (20 mg total) by mouth 2 (two) times daily.  60 capsule  5  . venlafaxine XR (EFFEXOR-XR) 75 MG 24 hr capsule TAKE ONE CAPSULE BY MOUTH ONCE DAILY  30 capsule  2  . zolmitriptan (ZOMIG) 5 MG tablet Take 0.5 tablets (2.5 mg total) by mouth as needed.  10 tablet  2   No current facility-administered medications on file prior to visit.    Review of Systems Headaches better.   No lightheadedness or dizziness.  Some minimal congestion - started today.   No chest pain, tightness or palpitations.  No increased shortness of breath.  No chest congestion or cough.  On Effexor.  Doing better.  Hot flashes better.  Sleeping better.   Feels better.  Not exercising.  Not watching what she eats.  Discussed tapering the estrogen.  Discussed the need to taper slowly.        Objective:   Physical Exam  Filed Vitals:   11/29/12 1559  BP: 130/90  Pulse: 80  Temp: 98.1 F (36.7 C)   Blood pressure recheck:  62/64  62 year old female in no acute distress.   HEENT:  Nares- clear.  Oropharynx - without lesions.  No sinus tenderness.  NECK:  Supple.  Nontender.  No audible bruit.  HEART:  Appears to be regular. LUNGS:  No crackles or wheezing audible.  Respirations even and unlabored.  RADIAL PULSE:  Equal bilaterally.  ABDOMEN:  Soft, nontender.  Bowel sounds present and normal.  No audible abdominal bruit.  EXTREMITIES:  No increased edema present.  DP pulses palpable and equal bilaterally.          Assessment & Plan:  MSK.  Back better.     GYN.  Sees gyn for her pelvic and pap smears.  States she is up to date.  Follow.    CARDIOVASCULAR.  Asymptomatic.    GI.  Colonoscopy 05/19/03 - normal.  Follow.   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  On  Effexor.  Follow.         HEALTH MAINTENANCE.  Breasts, pelvic and pap smears through gyn.  States she is up to date.  Mammogram 03/24/11 - Birads II.  Mammogram 04/29/12 - Birads I.  Colonoscopy 05/19/03 - normal.

## 2012-12-02 NOTE — Assessment & Plan Note (Signed)
Followed by Dr Meredeth Ide.  CT scans have revealed stable pulmonary nodules as well as stable enlarged mediastinal lymph nodes.  He is following her DLCO and lung function.  Breathing is stable.

## 2012-12-02 NOTE — Assessment & Plan Note (Signed)
On Effexor.  Doing better.  Discussed tapering her estrogen slowly.  Follow.

## 2012-12-02 NOTE — Assessment & Plan Note (Signed)
Seeing Dr Ellen Wells.  Doing better.   Follow.   

## 2012-12-02 NOTE — Assessment & Plan Note (Addendum)
Last bone density revealed osteopenia.  Calcium and vitamin D and weight bearing exercise.  Follow.  Recent vitamin D level decreased.  Started vitamin D3 2000 q day.

## 2012-12-02 NOTE — Assessment & Plan Note (Signed)
Slight elevation initially.  Have her spot check her pressure.  Follow.

## 2012-12-02 NOTE — Assessment & Plan Note (Signed)
On prilosec. Reflux better.  Follow.     

## 2012-12-02 NOTE — Assessment & Plan Note (Signed)
Recent AST/ALT within normal limits.  Denies alcohol intake.  Follow.

## 2013-02-08 ENCOUNTER — Encounter: Payer: Self-pay | Admitting: Internal Medicine

## 2013-02-08 ENCOUNTER — Ambulatory Visit (INDEPENDENT_AMBULATORY_CARE_PROVIDER_SITE_OTHER): Payer: No Typology Code available for payment source | Admitting: Internal Medicine

## 2013-02-08 VITALS — BP 118/78 | HR 85 | Temp 98.4°F | Ht 64.0 in

## 2013-02-08 DIAGNOSIS — D869 Sarcoidosis, unspecified: Secondary | ICD-10-CM

## 2013-02-08 DIAGNOSIS — J069 Acute upper respiratory infection, unspecified: Secondary | ICD-10-CM

## 2013-02-08 MED ORDER — FLUTICASONE PROPIONATE 50 MCG/ACT NA SUSP: 2.0000 | Freq: Every day | NASAL | Status: DC

## 2013-02-08 MED ORDER — CEFDINIR 300 MG PO CAPS
300.0000 mg | ORAL_CAPSULE | Freq: Two times a day (BID) | ORAL | Status: DC
Start: 2013-02-08 — End: 2013-02-08

## 2013-02-08 MED ORDER — CEFDINIR 300 MG PO CAPS: 300.0000 mg | ORAL_CAPSULE | Freq: Two times a day (BID) | ORAL | Status: DC

## 2013-02-08 NOTE — Progress Notes (Signed)
Pre-visit discussion using our clinic review tool. No additional management support is needed unless otherwise documented below in the visit note.  

## 2013-02-08 NOTE — Telephone Encounter (Signed)
Noted  

## 2013-02-08 NOTE — Patient Instructions (Signed)
Saline nasal spray - flush nose at least 2-3x/day.  Flonase nasal spray - 2 sprays each nostril one time per day (do this in the evening).  Mucinex DM in the morning and Robitussin DM in the evening.  Rest.  Fluids.

## 2013-02-09 ENCOUNTER — Ambulatory Visit: Payer: No Typology Code available for payment source | Admitting: Internal Medicine

## 2013-02-13 ENCOUNTER — Encounter: Payer: Self-pay | Admitting: Internal Medicine

## 2013-02-13 NOTE — Assessment & Plan Note (Signed)
Symptoms as outlined.  Concern over possible uri and sinus infection.  Treat with omnicef as directed.  Flonase and saline nasal spray as directed.  Mucinex in the am and Robitussin in the evening.  Rest.  Fluids.  Return if symptoms do not improve.

## 2013-02-13 NOTE — Assessment & Plan Note (Signed)
Followed by Dr Raul Del.  CT scans have revealed stable pulmonary nodules as well as stable enlarged mediastinal lymph nodes.  He is following her DLCO and lung function.  Breathing has been stable.. Treat current infection.

## 2013-02-13 NOTE — Progress Notes (Signed)
  Subjective:    [name], female    DOB: Apr 23, 1950, 63 y.o.   MRN: [id number]  Sinusitis  63 year old female with past history of hiatal hernia, depression, migraine headaches, hypercholesterolemia and sarcoidosis (followed by Dr Rico Ala).  She comes in today as a workin with concerns regarding a possible sinus infection.  Symptoms started last week.  Went to the [facility].  Was given a Zpak.  Felt some better.  Now with increased sinus pressure.  Ears hurting.  Increased cough and chest congestion.  Cough productive.  No fever.  Muscles sore from coughing.  Has taken tylenol and an allergy pill.      Past Medical History  Diagnosis Date  . Migraines   . Hiatal hernia with gastroesophageal reflux   . Depression   . Sarcoidosis   . Hypercholesterolemia     Current Outpatient Prescriptions on File Prior to Visit  Medication Sig Dispense Refill  . cyclobenzaprine (FLEXERIL) 5 MG tablet Take 1 tablet (5 mg total) by mouth at bedtime as needed for muscle spasms.  20 tablet  0  . estradiol (ESTRACE) 1 MG tablet TAKE ONE TABLET BY MOUTH EVERY DAY  30 tablet  5  . fish oil-omega-3 fatty acids 1000 MG capsule Take 1 g by mouth 3 (three) times daily.      . mometasone (NASONEX) 50 MCG/ACT nasal spray Place 2 sprays into the nose daily.      Marland Kitchen omeprazole (PRILOSEC) 20 MG capsule Take 1 capsule (20 mg total) by mouth 2 (two) times daily.  60 capsule  5  . venlafaxine XR (EFFEXOR-XR) 75 MG 24 hr capsule TAKE ONE CAPSULE BY MOUTH ONCE DAILY  30 capsule  2  . zolmitriptan (ZOMIG) 5 MG tablet Take 0.5 tablets (2.5 mg total) by mouth as needed.  10 tablet  2   No current facility-administered medications on file prior to visit.    Review of Systems No lightheadedness or dizziness.  Does report some increased sinus pressure.  See above.  No chest pain, tightness or palpitations.  No increased shortness of breath.  Increased chest congestion and cough.  No fever.  No nausea or  vomiting.  No bowel change.         Objective:   Physical Exam  Filed Vitals:   02/08/13 1159  BP: 118/78  Pulse: 85  Temp: 98.4 F (78.62 C)   63 year old female in no acute distress.   HEENT:  Nares- clear.  Oropharynx - without lesions.  No sinus tenderness.  NECK:  Supple.  Nontender.  No audible bruit.  HEART:  Appears to be regular. LUNGS:  No crackles or wheezing audible.  Respirations even and unlabored.  RADIAL PULSE:  Equal bilaterally.  ABDOMEN:  Soft, nontender.  Bowel sounds present and normal.  No audible abdominal bruit.  EXTREMITIES:  No increased edema present.  DP pulses palpable and equal bilaterally.          Assessment & Plan:  HEALTH MAINTENANCE.  Breasts, pelvic and pap smears through gyn.  States she is up to date.  Mammogram 03/24/11 - Birads II.  Mammogram 04/29/12 - Birads I.  Colonoscopy 05/19/03 - normal.

## 2013-03-06 ENCOUNTER — Other Ambulatory Visit: Payer: Self-pay | Admitting: Internal Medicine

## 2013-03-29 ENCOUNTER — Encounter: Payer: Self-pay | Admitting: Internal Medicine

## 2013-03-29 ENCOUNTER — Ambulatory Visit (INDEPENDENT_AMBULATORY_CARE_PROVIDER_SITE_OTHER): Payer: No Typology Code available for payment source | Admitting: Internal Medicine

## 2013-03-29 VITALS — BP 110/80 | HR 75 | Temp 98.3°F | Ht 64.0 in | Wt 180.0 lb

## 2013-03-29 DIAGNOSIS — M8580 Other specified disorders of bone density and structure, unspecified site: Secondary | ICD-10-CM

## 2013-03-29 DIAGNOSIS — R7989 Other specified abnormal findings of blood chemistry: Secondary | ICD-10-CM

## 2013-03-29 DIAGNOSIS — K219 Gastro-esophageal reflux disease without esophagitis: Secondary | ICD-10-CM

## 2013-03-29 DIAGNOSIS — Z1239 Encounter for other screening for malignant neoplasm of breast: Secondary | ICD-10-CM

## 2013-03-29 DIAGNOSIS — J069 Acute upper respiratory infection, unspecified: Secondary | ICD-10-CM

## 2013-03-29 DIAGNOSIS — D869 Sarcoidosis, unspecified: Secondary | ICD-10-CM

## 2013-03-29 DIAGNOSIS — E7800 Pure hypercholesterolemia, unspecified: Secondary | ICD-10-CM

## 2013-03-29 DIAGNOSIS — IMO0001 Reserved for inherently not codable concepts without codable children: Secondary | ICD-10-CM

## 2013-03-29 DIAGNOSIS — M899 Disorder of bone, unspecified: Secondary | ICD-10-CM

## 2013-03-29 DIAGNOSIS — N951 Menopausal and female climacteric states: Secondary | ICD-10-CM

## 2013-03-29 DIAGNOSIS — R945 Abnormal results of liver function studies: Secondary | ICD-10-CM

## 2013-03-29 DIAGNOSIS — M949 Disorder of cartilage, unspecified: Secondary | ICD-10-CM

## 2013-03-29 DIAGNOSIS — R030 Elevated blood-pressure reading, without diagnosis of hypertension: Secondary | ICD-10-CM

## 2013-03-29 DIAGNOSIS — R32 Unspecified urinary incontinence: Secondary | ICD-10-CM

## 2013-03-29 NOTE — Progress Notes (Signed)
Pre-visit discussion using our clinic review tool. No additional management support is needed unless otherwise documented below in the visit note.  

## 2013-03-30 ENCOUNTER — Encounter: Payer: Self-pay | Admitting: Internal Medicine

## 2013-03-30 MED ORDER — PROMETHAZINE HCL 12.5 MG RE SUPP: 12.5000 mg | Freq: Two times a day (BID) | RECTAL | Status: DC | PRN

## 2013-04-02 ENCOUNTER — Encounter: Payer: Self-pay | Admitting: Internal Medicine

## 2013-04-02 NOTE — Assessment & Plan Note (Signed)
Blood pressure as outlined.  Follow.   

## 2013-04-02 NOTE — Assessment & Plan Note (Signed)
Back on effexor and estrogen.  Will allow her to let things level off now.  She had stopped her medications abruptly.  Allow things to level off and then will plan slow taper of estrogen.  Follow.

## 2013-04-02 NOTE — Assessment & Plan Note (Signed)
On prilosec. Reflux better.  Follow.

## 2013-04-02 NOTE — Assessment & Plan Note (Signed)
Followed by Dr Fleming.  CT scans have revealed stable pulmonary nodules as well as stable enlarged mediastinal lymph nodes.  He is following her DLCO and lung function.  Breathing has been stable..   

## 2013-04-02 NOTE — Progress Notes (Addendum)
Subjective:    [name], female    DOB: 1950/12/19, 63 y.o.   MRN: [id number]  HPI 63 year old female with past history of hiatal hernia, depression, migraine headaches, hypercholesterolemia and sarcoidosis (followed by Dr Rico Ala).  She comes in today for a scheduled follow up.  Last visit was having problems with increased congestion and sinusitis/uri.  Treated.  While sick, she stopped all of her medications.  With increased difficulty sleeping, etc she just recently started back on her medications.   She is back on her Effexor and estrogen.  Not exercising.  Not watching what she eats.  Denies depression.  Increased triglycerides.  Discussed diet and exercise.       Past Medical History  Diagnosis Date  . Migraines   . Hiatal hernia with gastroesophageal reflux   . Depression   . Sarcoidosis   . Hypercholesterolemia     Current Outpatient Prescriptions on File Prior to Visit  Medication Sig Dispense Refill  . cyclobenzaprine (FLEXERIL) 5 MG tablet Take 1 tablet (5 mg total) by mouth at bedtime as needed for muscle spasms.  20 tablet  0  . estradiol (ESTRACE) 1 MG tablet TAKE ONE TABLET BY MOUTH EVERY DAY  30 tablet  5  . fish oil-omega-3 fatty acids 1000 MG capsule Take 1 g by mouth 3 (three) times daily.      . fluticasone (FLONASE) 50 MCG/ACT nasal spray Place 2 sprays into both nostrils daily.  16 g  1  . mometasone (NASONEX) 50 MCG/ACT nasal spray Place 2 sprays into the nose daily.      Marland Kitchen omeprazole (PRILOSEC) 20 MG capsule TAKE 1 CAPSULE TWICE DAILY  60 capsule  5  . venlafaxine XR (EFFEXOR-XR) 75 MG 24 hr capsule TAKE ONE CAPSULE BY MOUTH ONCE DAILY  30 capsule  2  . zolmitriptan (ZOMIG) 5 MG tablet Take 0.5 tablets (2.5 mg total) by mouth as needed.  10 tablet  2   No current facility-administered medications on file prior to visit.    Review of Systems Headaches better.   No lightheadedness or dizziness.  No sinus or allergy symptoms.  No chest pain,  tightness or palpitations.  No increased shortness of breath.  Back on Effexor and her estrogen.   Some increased fatigue.  Not exercising.  Not watching what she eats.  Discussed recent labs with  Increased triglycerides.  Also discussed increased liver function test and the possibility of fatty liver.         Objective:   Physical Exam  Filed Vitals:   03/29/13 1533  BP: 110/80  Pulse: 75  Temp: 98.3 F (13.72 C)   63 year old female in no acute distress.   HEENT:  Nares- clear.  Oropharynx - without lesions. NECK:  Supple.  Nontender.  No audible bruit.  HEART:  Appears to be regular. LUNGS:  No crackles or wheezing audible.  Respirations even and unlabored.  RADIAL PULSE:  Equal bilaterally.  ABDOMEN:  Soft, nontender.  Bowel sounds present and normal.  No audible abdominal bruit.  EXTREMITIES:  No increased edema present.  DP pulses palpable and equal bilaterally.          Assessment & Plan:  MSK.  Back better.     GYN.  Sees gyn for her pelvic and pap smears.  States she is up to date.  Follow.    CARDIOVASCULAR.  Asymptomatic.    GI.  Colonoscopy 05/19/03 - normal.  Follow.  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  Back on Effexor.  Follow.   See if symptoms level off.        HEALTH MAINTENANCE.  Breasts, pelvic and pap smears through gyn.  States she is up to date.  Mammogram 03/24/11 - Birads II.  Mammogram 04/29/12 - Birads I.  Schedule f/u mammogram when due.  Colonoscopy 05/19/03 - normal.  Will schedule for f/u mammogram (first of May 2015).    I spent 25 minutes with the patient and more than 50% of the time was spent in consultation regarding the above.

## 2013-04-02 NOTE — Assessment & Plan Note (Signed)
Last bone density revealed osteopenia.  Calcium and vitamin D and weight bearing exercise.  Follow.  Previous vitamin D level decreased.  Follow.

## 2013-04-02 NOTE — Assessment & Plan Note (Signed)
Increased triglycerides.  Discussed importance of diet and exercise.  Follow.

## 2013-04-02 NOTE — Addendum Note (Signed)
Addended by: Alisa Graff on: 04/02/2013 05:29 PM   Modules accepted: Orders

## 2013-04-02 NOTE — Assessment & Plan Note (Signed)
Recent AST/ALT within normal limits.  Denies alcohol intake.  Follow.  Will recheck.  If persistent elevation, will check abdominal ultrasound.

## 2013-04-02 NOTE — Assessment & Plan Note (Signed)
Resolved

## 2013-04-02 NOTE — Assessment & Plan Note (Signed)
Seeing Dr Lonia Blood.  Doing better.   Follow.

## 2013-04-05 ENCOUNTER — Encounter: Payer: Self-pay | Admitting: Emergency Medicine

## 2013-05-05 ENCOUNTER — Encounter: Payer: Self-pay | Admitting: Internal Medicine

## 2013-05-06 ENCOUNTER — Ambulatory Visit (INDEPENDENT_AMBULATORY_CARE_PROVIDER_SITE_OTHER): Payer: No Typology Code available for payment source | Admitting: Internal Medicine

## 2013-05-06 ENCOUNTER — Encounter: Payer: Self-pay | Admitting: Internal Medicine

## 2013-05-06 VITALS — BP 110/80 | HR 73 | Temp 97.7°F | Ht 64.0 in | Wt 171.2 lb

## 2013-05-06 DIAGNOSIS — E7800 Pure hypercholesterolemia, unspecified: Secondary | ICD-10-CM

## 2013-05-06 DIAGNOSIS — N898 Other specified noninflammatory disorders of vagina: Secondary | ICD-10-CM

## 2013-05-06 DIAGNOSIS — N9489 Other specified conditions associated with female genital organs and menstrual cycle: Secondary | ICD-10-CM

## 2013-05-06 DIAGNOSIS — N951 Menopausal and female climacteric states: Secondary | ICD-10-CM

## 2013-05-06 DIAGNOSIS — R32 Unspecified urinary incontinence: Secondary | ICD-10-CM

## 2013-05-06 MED ORDER — ESTRADIOL 0.1 MG/GM VA CREA: TOPICAL_CREAM | VAGINAL | Status: DC

## 2013-05-06 NOTE — Progress Notes (Signed)
Pre visit review using our clinic review tool, if applicable. No additional management support is needed unless otherwise documented below in the visit note. 

## 2013-05-08 ENCOUNTER — Encounter: Payer: Self-pay | Admitting: Internal Medicine

## 2013-05-08 DIAGNOSIS — N898 Other specified noninflammatory disorders of vagina: Secondary | ICD-10-CM | POA: Insufficient documentation

## 2013-05-08 NOTE — Progress Notes (Signed)
Subjective:    [name], female    DOB: July 14, 1950, 63 y.o.   MRN: [id number]  HPI 63 year old female with past history of hiatal hernia, depression, migraine headaches, hypercholesterolemia and sarcoidosis (followed by Dr Rico Ala).  She comes in today as a work in with concerns regarding urinary incontinence and an overactive bladder.  Also, wants to discuss vaginal dryness.   On oral estrogen.  We discussed treatment options for vaginal dryness.  She desired topical estrogen.  We discussed possible side effects.  Her hot flashes are better.  May be able to taper off the estrogen in the near future and continue her on the effexor.  She is also having some stress incontinence and overactive bladder.  Has tried vesicare previously.  Seemed to do ok on this medication.   She has adjusted her diet.  Has lost weight.  Is exercising.        Past Medical History  Diagnosis Date  . Migraines   . Hiatal hernia with gastroesophageal reflux   . Depression   . Sarcoidosis   . Hypercholesterolemia     Current Outpatient Prescriptions on File Prior to Visit  Medication Sig Dispense Refill  . cyclobenzaprine (FLEXERIL) 5 MG tablet Take 1 tablet (5 mg total) by mouth at bedtime as needed for muscle spasms.  20 tablet  0  . estradiol (ESTRACE) 1 MG tablet TAKE ONE TABLET BY MOUTH EVERY DAY  30 tablet  5  . fish oil-omega-3 fatty acids 1000 MG capsule Take 1 g by mouth 3 (three) times daily.      . fluticasone (FLONASE) 50 MCG/ACT nasal spray Place 2 sprays into both nostrils daily.  16 g  1  . mometasone (NASONEX) 50 MCG/ACT nasal spray Place 2 sprays into the nose daily.      Marland Kitchen omeprazole (PRILOSEC) 20 MG capsule TAKE 1 CAPSULE TWICE DAILY  60 capsule  5  . venlafaxine XR (EFFEXOR-XR) 75 MG 24 hr capsule TAKE ONE CAPSULE BY MOUTH ONCE DAILY  30 capsule  2  . zolmitriptan (ZOMIG) 5 MG tablet Take 0.5 tablets (2.5 mg total) by mouth as needed.  10 tablet  2   No current  facility-administered medications on file prior to visit.    Review of Systems No chest pain, tightness or palpitations.  No increased shortness of breath.  Back on Effexor and her estrogen.   is exercising.  Has adjusted her diet.  Has lost weight.  Vaginal dryness as outlined.  Urinary issues as outlined.          Objective:   Physical Exam  Filed Vitals:   05/06/13 1551  BP: 110/80  Pulse: 73  Temp: 97.7 F (36.5 C)   Blood pressure recheck:  16/52  63 year old female in no acute distress.  HEART:  Appears to be regular. LUNGS:  No crackles or wheezing audible.  Respirations even and unlabored.  RADIAL PULSE:  Equal bilaterally.  ABDOMEN:  Soft, nontender.  Bowel sounds present and normal.  No audible abdominal bruit.          Assessment & Plan:  MSK.  Back better.     GYN.  Sees gyn for her pelvic and pap smears.  States she is up to date.  Follow.    CARDIOVASCULAR.  Asymptomatic.    GI.  Colonoscopy 05/19/03 - normal.  Follow.   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  Back on Effexor.  Follow.  Appears to be doing better.         HEALTH MAINTENANCE.  Breasts, pelvic and pap smears through gyn.  States she is up to date.  Mammogram 03/24/11 - Birads II.  Mammogram 04/29/12 - Birads I.  Scheduled for a f/u mammogram. Colonoscopy 05/19/03 - normal.     I spent 25 minutes with the patient and more than 50% of the time was spent in consultation regarding the above.

## 2013-05-08 NOTE — Assessment & Plan Note (Signed)
Discussed at length with her today.  Will start her on estrace vaginal cream as directed.  Instructed on possible side effects and risk of estrogen.  Follow.

## 2013-05-08 NOTE — Assessment & Plan Note (Signed)
Back on effexor and estrogen.  Doing better.  Allow things to level off and then will plan slow taper of estrogen.  Follow.

## 2013-05-08 NOTE — Assessment & Plan Note (Signed)
Seeing Dr Lonia Blood.  Will give her another trial of vesicare.  Samples of 5mg  tablets given.  Follow.

## 2013-05-08 NOTE — Assessment & Plan Note (Signed)
Has adjusted her diet.  Has lost weight.  Follow.   

## 2013-05-25 ENCOUNTER — Encounter: Payer: Self-pay | Admitting: Internal Medicine

## 2013-05-25 MED ORDER — SOLIFENACIN SUCCINATE 5 MG PO TABS
5.0000 mg | ORAL_TABLET | Freq: Every day | ORAL | Status: DC
Start: 2013-05-25 — End: 2013-08-25

## 2013-05-25 NOTE — Telephone Encounter (Signed)
Sent in rx for vesicare 5mg  #30 with 2 refills to [facility].

## 2013-05-30 ENCOUNTER — Encounter: Payer: Self-pay | Admitting: Internal Medicine

## 2013-05-30 ENCOUNTER — Other Ambulatory Visit: Payer: Self-pay | Admitting: Internal Medicine

## 2013-05-31 MED ORDER — VENLAFAXINE HCL ER 75 MG PO CP24: ORAL_CAPSULE | ORAL | Status: DC

## 2013-06-06 ENCOUNTER — Ambulatory Visit: Payer: Self-pay | Admitting: Internal Medicine

## 2013-06-06 LAB — HM MAMMOGRAPHY: HM MAMMO: NEGATIVE

## 2013-06-07 ENCOUNTER — Encounter: Payer: Self-pay | Admitting: Internal Medicine

## 2013-07-27 ENCOUNTER — Encounter: Payer: Self-pay | Admitting: Internal Medicine

## 2013-07-29 ENCOUNTER — Encounter: Payer: Self-pay | Admitting: Internal Medicine

## 2013-07-29 ENCOUNTER — Ambulatory Visit (INDEPENDENT_AMBULATORY_CARE_PROVIDER_SITE_OTHER): Payer: No Typology Code available for payment source | Admitting: Internal Medicine

## 2013-07-29 VITALS — BP 130/70 | HR 78 | Temp 98.2°F | Ht 64.0 in | Wt 163.2 lb

## 2013-07-29 DIAGNOSIS — K219 Gastro-esophageal reflux disease without esophagitis: Secondary | ICD-10-CM

## 2013-07-29 DIAGNOSIS — N951 Menopausal and female climacteric states: Secondary | ICD-10-CM

## 2013-07-29 DIAGNOSIS — F439 Reaction to severe stress, unspecified: Secondary | ICD-10-CM

## 2013-07-29 DIAGNOSIS — E7800 Pure hypercholesterolemia, unspecified: Secondary | ICD-10-CM

## 2013-07-29 DIAGNOSIS — D869 Sarcoidosis, unspecified: Secondary | ICD-10-CM

## 2013-07-29 DIAGNOSIS — R945 Abnormal results of liver function studies: Secondary | ICD-10-CM

## 2013-07-29 DIAGNOSIS — Z733 Stress, not elsewhere classified: Secondary | ICD-10-CM

## 2013-07-29 DIAGNOSIS — R32 Unspecified urinary incontinence: Secondary | ICD-10-CM

## 2013-07-29 DIAGNOSIS — R7989 Other specified abnormal findings of blood chemistry: Secondary | ICD-10-CM

## 2013-07-29 DIAGNOSIS — R030 Elevated blood-pressure reading, without diagnosis of hypertension: Secondary | ICD-10-CM

## 2013-07-29 DIAGNOSIS — IMO0001 Reserved for inherently not codable concepts without codable children: Secondary | ICD-10-CM

## 2013-07-29 NOTE — Progress Notes (Signed)
Pre visit review using our clinic review tool, if applicable. No additional management support is needed unless otherwise documented below in the visit note. 

## 2013-08-01 ENCOUNTER — Encounter: Payer: Self-pay | Admitting: Internal Medicine

## 2013-08-01 ENCOUNTER — Telehealth: Payer: Self-pay | Admitting: Internal Medicine

## 2013-08-01 DIAGNOSIS — F439 Reaction to severe stress, unspecified: Secondary | ICD-10-CM | POA: Insufficient documentation

## 2013-08-01 DIAGNOSIS — F339 Major depressive disorder, recurrent, unspecified: Secondary | ICD-10-CM | POA: Insufficient documentation

## 2013-08-01 NOTE — Assessment & Plan Note (Signed)
Has adjusted her diet.  Has lost weight.  Follow.   

## 2013-08-01 NOTE — Progress Notes (Signed)
Subjective:    [name], female    DOB: September 19, 1950, 63 y.o.   MRN: [id number]  HPI 63 year old female with past history of hiatal hernia, depression, migraine headaches, hypercholesterolemia and sarcoidosis (followed by Dr Rico Ala).  She comes in today for a scheduled follow up.    She has adjusted her diet.  Has lost weight.  Is exercising.  Recent liver panel now wnl.  She does report increased stress.  Work stress and home stress.  We discussed this at length.  Some depression.  No suicidal ideations.  Does not sleep as well.  Mind does not shut down.  She is on effexor.  Menopausal symptoms are better.      Past Medical History  Diagnosis Date  . Migraines   . Hiatal hernia with gastroesophageal reflux   . Depression   . Sarcoidosis   . Hypercholesterolemia     Current Outpatient Prescriptions on File Prior to Visit  Medication Sig Dispense Refill  . cyclobenzaprine (FLEXERIL) 5 MG tablet Take 1 tablet (5 mg total) by mouth at bedtime as needed for muscle spasms.  20 tablet  0  . estradiol (ESTRACE) 0.1 MG/GM vaginal cream q hs x 5 nights and then twice a week  42.5 g  1  . estradiol (ESTRACE) 1 MG tablet TAKE ONE TABLET BY MOUTH EVERY DAY  30 tablet  5  . fish oil-omega-3 fatty acids 1000 MG capsule Take 1 g by mouth 3 (three) times daily.      . fluticasone (FLONASE) 50 MCG/ACT nasal spray Place 2 sprays into both nostrils daily.  16 g  1  . mometasone (NASONEX) 50 MCG/ACT nasal spray Place 2 sprays into the nose daily.      Marland Kitchen omeprazole (PRILOSEC) 20 MG capsule TAKE 1 CAPSULE TWICE DAILY  60 capsule  5  . solifenacin (VESICARE) 5 MG tablet Take 1 tablet (5 mg total) by mouth daily.  30 tablet  2  . venlafaxine XR (EFFEXOR-XR) 75 MG 24 hr capsule TAKE ONE CAPSULE BY MOUTH ONCE DAILY  30 capsule  2  . zolmitriptan (ZOMIG) 5 MG tablet Take 0.5 tablets (2.5 mg total) by mouth as needed.  10 tablet  2   No current facility-administered medications on file prior to  visit.    Review of Systems no sinus or allergy symptoms.  Breathing stable.  No chest pain, tightness or palpitations.  No increased shortness of breath.  On Effexor and her estrogen.   is exercising.  Has adjusted her diet.  Has lost weight.  vesicare helping the bladder symptoms.  Increased stress and depression as outlined.           Objective:   Physical Exam  Filed Vitals:   07/29/13 1610  BP: 130/70  Pulse: 78  Temp: 98.2 F (36.8 C)   Blood pressure recheck:  17/34  63 year old female in no acute distress.   HEENT:  Nares- clear.  Oropharynx - without lesions. NECK:  Supple.  Nontender.  No audible bruit.  HEART:  Appears to be regular. LUNGS:  No crackles or wheezing audible.  Respirations even and unlabored.  RADIAL PULSE:  Equal bilaterally. ABDOMEN:  Soft, nontender.  Bowel sounds present and normal.  No audible abdominal bruit.   EXTREMITIES:  No increased edema present.  DP pulses palpable and equal bilaterally.          Assessment & Plan:  MSK.  Back better.  GYN.  Sees gyn for her pelvic and pap smears.  States she is up to date.  Follow.    CARDIOVASCULAR.  Asymptomatic.    GI.  Colonoscopy 05/19/03 - normal.  Follow.         HEALTH MAINTENANCE.  Breasts, pelvic and pap smears through gyn.  States she is up to date.  Mammogram 06/06/13 - Birads I.  Colonoscopy 05/19/03 - normal.     I spent 25 minutes with the patient and more than 50% of the time was spent in consultation regarding the above.

## 2013-08-01 NOTE — Assessment & Plan Note (Signed)
On prilosec. Symptoms controlled.  Follow.

## 2013-08-01 NOTE — Assessment & Plan Note (Signed)
Followed by Dr Raul Del.  CT scans have revealed stable pulmonary nodules as well as stable enlarged mediastinal lymph nodes.  He is following her DLCO and lung function.  Breathing has been stable.Christy Escobar

## 2013-08-01 NOTE — Assessment & Plan Note (Signed)
Recent AST/ALT within normal limits.  Has adjusted her diet.  Lost weight.   Follow.

## 2013-08-01 NOTE — Assessment & Plan Note (Signed)
Increased stress at work and at home.  Discussed at length with her today.  No suicidal ideations.  She does states she feels she is in a dark hole.  Is some better, but still not where she wants to be.  On effexor.  Discussed referral to psych.  She is agreeable.  Will schedule an appt with Dr Nicolasa Ducking.  She know if any symptom change or problems, she is to call or be reevaluated.

## 2013-08-01 NOTE — Assessment & Plan Note (Signed)
Back on effexor and estrogen.  Doing better.  Hopefully will be able to taper off estrogen.  Follow.

## 2013-08-01 NOTE — Assessment & Plan Note (Signed)
Blood pressure doing well.  Follow.  

## 2013-08-01 NOTE — Assessment & Plan Note (Signed)
Has seen Dr Lonia Blood.  Doing well with vesicare.  Follow.

## 2013-08-01 NOTE — Telephone Encounter (Signed)
Pt left without scheduling an appt.  Needs a f/u appt in 6-7 weeks - 30 minute or end of 1/2 day.  Needs late pm appt.

## 2013-08-15 ENCOUNTER — Encounter: Payer: Self-pay | Admitting: Internal Medicine

## 2013-08-17 LAB — LIPID PANEL
Cholesterol: 261 mg/dL — AB (ref 0–200)
HDL: 47 mg/dL (ref 35–70)
LDL Cholesterol: 166 mg/dL
LDL/HDL RATIO: 5.6
TRIGLYCERIDES: 240 mg/dL — AB (ref 40–160)

## 2013-08-17 LAB — BASIC METABOLIC PANEL
BUN: 21 mg/dL (ref 4–21)
CREATININE: 0.8 mg/dL (ref 0.5–1.1)
Glucose: 110 mg/dL
Potassium: 4.1 mmol/L (ref 3.4–5.3)
Sodium: 139 mmol/L (ref 137–147)

## 2013-08-17 LAB — HEPATIC FUNCTION PANEL
ALT: 33 U/L (ref 7–35)
AST: 23 U/L (ref 13–35)
Alkaline Phosphatase: 51 U/L (ref 25–125)
BILIRUBIN, TOTAL: 0.4 mg/dL

## 2013-08-17 LAB — CBC AND DIFFERENTIAL
HCT: 41 % (ref 36–46)
HEMOGLOBIN: 13.7 g/dL (ref 12.0–16.0)
Neutrophils Absolute: 4 /uL
Platelets: 277 10*3/uL (ref 150–399)
WBC: 6.8 10^3/mL

## 2013-08-17 LAB — TSH: TSH: 3.81 u[IU]/mL (ref 0.41–5.90)

## 2013-08-19 ENCOUNTER — Encounter: Payer: Self-pay | Admitting: Internal Medicine

## 2013-08-22 ENCOUNTER — Telehealth: Payer: Self-pay | Admitting: Internal Medicine

## 2013-08-22 ENCOUNTER — Encounter: Payer: Self-pay | Admitting: Internal Medicine

## 2013-08-22 NOTE — Telephone Encounter (Signed)
Pt notified of lab results via my chart.  (triglycerides 240, LDL 166).  Liver function tests improved.

## 2013-08-25 ENCOUNTER — Other Ambulatory Visit: Payer: Self-pay | Admitting: Internal Medicine

## 2013-08-30 ENCOUNTER — Other Ambulatory Visit: Payer: Self-pay | Admitting: Internal Medicine

## 2013-09-13 ENCOUNTER — Ambulatory Visit (INDEPENDENT_AMBULATORY_CARE_PROVIDER_SITE_OTHER): Payer: No Typology Code available for payment source | Admitting: Internal Medicine

## 2013-09-13 ENCOUNTER — Encounter: Payer: Self-pay | Admitting: Internal Medicine

## 2013-09-13 VITALS — BP 140/80 | HR 77 | Temp 98.2°F | Ht 64.0 in | Wt 166.2 lb

## 2013-09-13 DIAGNOSIS — M899 Disorder of bone, unspecified: Secondary | ICD-10-CM

## 2013-09-13 DIAGNOSIS — K219 Gastro-esophageal reflux disease without esophagitis: Secondary | ICD-10-CM

## 2013-09-13 DIAGNOSIS — F439 Reaction to severe stress, unspecified: Secondary | ICD-10-CM

## 2013-09-13 DIAGNOSIS — IMO0001 Reserved for inherently not codable concepts without codable children: Secondary | ICD-10-CM

## 2013-09-13 DIAGNOSIS — Z733 Stress, not elsewhere classified: Secondary | ICD-10-CM

## 2013-09-13 DIAGNOSIS — N951 Menopausal and female climacteric states: Secondary | ICD-10-CM

## 2013-09-13 DIAGNOSIS — R030 Elevated blood-pressure reading, without diagnosis of hypertension: Secondary | ICD-10-CM

## 2013-09-13 DIAGNOSIS — R945 Abnormal results of liver function studies: Secondary | ICD-10-CM

## 2013-09-13 DIAGNOSIS — M949 Disorder of cartilage, unspecified: Secondary | ICD-10-CM

## 2013-09-13 DIAGNOSIS — E7800 Pure hypercholesterolemia, unspecified: Secondary | ICD-10-CM

## 2013-09-13 DIAGNOSIS — M8580 Other specified disorders of bone density and structure, unspecified site: Secondary | ICD-10-CM

## 2013-09-13 DIAGNOSIS — R32 Unspecified urinary incontinence: Secondary | ICD-10-CM

## 2013-09-13 DIAGNOSIS — R7989 Other specified abnormal findings of blood chemistry: Secondary | ICD-10-CM

## 2013-09-13 MED ORDER — SOLIFENACIN SUCCINATE 10 MG PO TABS: 10.0000 mg | ORAL_TABLET | Freq: Every day | ORAL | Status: DC

## 2013-09-13 NOTE — Progress Notes (Signed)
Pre visit review using our clinic review tool, if applicable. No additional management support is needed unless otherwise documented below in the visit note. 

## 2013-09-14 ENCOUNTER — Encounter: Payer: Self-pay | Admitting: Internal Medicine

## 2013-09-14 NOTE — Assessment & Plan Note (Signed)
Has adjusted her diet.  Has lost weight.  Triglycerides improved from 325 down to 240.  LDL slightly improved.  She desires not to take any more medication.  Continue low cholesterol diet and exercise.  Will follow.

## 2013-09-14 NOTE — Progress Notes (Signed)
Subjective:    [name], female    DOB: 1950-10-17, 63 y.o.   MRN: [id number]  HPI 64 year old female with past history of hiatal hernia, depression, migraine headaches, hypercholesterolemia and sarcoidosis (followed by Dr Rico Ala).  She comes in today for a scheduled follow up.    She has adjusted her diet.  Is exercising.  Recent liver panel just revealed a slightly increased ALT (33) otherwise normal.  Triglycerides had improved.  She still reports increased stress.  Work stress and home stress.  We discussed this at length again today.  States "things at home" are better.  Some depression.  No suicidal ideations.  Does not sleep as well.  On effexor.  Overall states may be some better from last visit.  She is scheduled to see psychiatry next month.  She report the vesicare helps her bladder symptoms, but she still has some issues.  We discussed the difference between stress incontinence and an overactive bladder.  She appears to have issues with both.  She would like to increase the vesicare and see if gains more benefit.      Past Medical History  Diagnosis Date  . Migraines   . Hiatal hernia with gastroesophageal reflux   . Depression   . Sarcoidosis   . Hypercholesterolemia     Current Outpatient Prescriptions on File Prior to Visit  Medication Sig Dispense Refill  . cyclobenzaprine (FLEXERIL) 5 MG tablet Take 1 tablet (5 mg total) by mouth at bedtime as needed for muscle spasms.  20 tablet  0  . estradiol (ESTRACE) 0.1 MG/GM vaginal cream q hs x 5 nights and then twice a week  42.5 g  1  . estradiol (ESTRACE) 1 MG tablet TAKE ONE TABLET BY MOUTH ONCE DAILY  30 tablet  0  . fish oil-omega-3 fatty acids 1000 MG capsule Take 1 g by mouth 3 (three) times daily.      . fluticasone (FLONASE) 50 MCG/ACT nasal spray Place 2 sprays into both nostrils daily.  16 g  1  . mometasone (NASONEX) 50 MCG/ACT nasal spray Place 2 sprays into the nose daily.      Marland Kitchen omeprazole (PRILOSEC)  20 MG capsule TAKE 1 CAPSULE TWICE DAILY  60 capsule  5  . venlafaxine XR (EFFEXOR-XR) 75 MG 24 hr capsule TAKE ONE CAPSULE BY MOUTH ONCE DAILY  30 capsule  0  . zolmitriptan (ZOMIG) 5 MG tablet Take 0.5 tablets (2.5 mg total) by mouth as needed.  10 tablet  2   No current facility-administered medications on file prior to visit.    Review of Systems No sinus or allergy symptoms.  Breathing stable.  No chest pain, tightness or palpitations.  No increased shortness of breath.  On Effexor and her estrogen.   is exercising.  Has adjusted her diet.  Has lost weight.  Vesicare helping the bladder symptoms.  Still some persistent issues.  See above.  Increased stress and depression as outlined.  Some better.  Has appt next month with psychiatry.  No suicidal ideations.           Objective:   Physical Exam  Filed Vitals:   09/13/13 1626  BP: 140/80  Pulse: 77  Temp: 98.2 F (36.8 C)   Blood pressure recheck:  30/23  63 year old female in no acute distress.   HEENT:  Nares- clear.  Oropharynx - without lesions. NECK:  Supple.  Nontender.  No audible bruit.  HEART:  Appears to be regular. LUNGS:  No crackles or wheezing audible.  Respirations even and unlabored.  RADIAL PULSE:  Equal bilaterally. ABDOMEN:  Soft, nontender.  Bowel sounds present and normal.  No audible abdominal bruit.   EXTREMITIES:  No increased edema present.  DP pulses palpable and equal bilaterally.          Assessment & Plan:  MSK.  Back better.     GYN.  Sees gyn for her pelvic and pap smears.  States she is up to date.  Follow.    CARDIOVASCULAR.  Asymptomatic.    GI.  Colonoscopy 05/19/03 - normal.  Follow.         HEALTH MAINTENANCE.  Breasts, pelvic and pap smears through gyn.  States she is up to date.  Mammogram 06/06/13 - Birads I.  Colonoscopy 05/19/03 - normal.     I spent 25 minutes with the patient and more than 50% of the time was spent in consultation regarding the above.

## 2013-09-14 NOTE — Assessment & Plan Note (Signed)
Back on effexor and estrogen.  Doing better.  Hopefully will be able to taper off estrogen.  Follow.

## 2013-09-14 NOTE — Assessment & Plan Note (Signed)
Last bone density revealed osteopenia.  Calcium and vitamin D and weight bearing exercise.  Follow.  Vitamin D level just checked 08/19/13 - wnl.

## 2013-09-14 NOTE — Assessment & Plan Note (Signed)
Increased stress at work and at home.  Discussed at length with her today.  No suicidal ideations.  Continue current medication regimen for now.  Has an appt with Dr Nicolasa Ducking next month.

## 2013-09-14 NOTE — Assessment & Plan Note (Signed)
Has seen Dr Lonia Blood.  Doing better on vesicare.  Wants to increase the dose.  Increase to 10mg .  Follow.

## 2013-09-14 NOTE — Assessment & Plan Note (Signed)
Recent AST/ALT essentially within normal limits. (ALT 33 with normal 32).   Has adjusted her diet.  Lost weight.   Follow.

## 2013-09-14 NOTE — Assessment & Plan Note (Signed)
Elevated on initial check.  Recheck wnl.  Follow.

## 2013-09-14 NOTE — Assessment & Plan Note (Signed)
On prilosec. Symptoms controlled.  Follow.

## 2013-11-10 ENCOUNTER — Other Ambulatory Visit: Payer: Self-pay | Admitting: *Deleted

## 2013-11-10 MED ORDER — ESTRADIOL 1 MG PO TABS: ORAL_TABLET | ORAL | Status: DC

## 2013-11-26 ENCOUNTER — Other Ambulatory Visit: Payer: Self-pay | Admitting: Internal Medicine

## 2013-12-14 ENCOUNTER — Encounter: Payer: Self-pay | Admitting: Internal Medicine

## 2013-12-16 ENCOUNTER — Encounter: Payer: Self-pay | Admitting: Internal Medicine

## 2013-12-16 ENCOUNTER — Ambulatory Visit (INDEPENDENT_AMBULATORY_CARE_PROVIDER_SITE_OTHER): Payer: No Typology Code available for payment source | Admitting: Internal Medicine

## 2013-12-16 VITALS — BP 130/70 | HR 67 | Temp 98.2°F | Ht 64.0 in | Wt 172.0 lb

## 2013-12-16 DIAGNOSIS — K219 Gastro-esophageal reflux disease without esophagitis: Secondary | ICD-10-CM

## 2013-12-16 DIAGNOSIS — Z658 Other specified problems related to psychosocial circumstances: Secondary | ICD-10-CM

## 2013-12-16 DIAGNOSIS — D869 Sarcoidosis, unspecified: Secondary | ICD-10-CM

## 2013-12-16 DIAGNOSIS — E7800 Pure hypercholesterolemia, unspecified: Secondary | ICD-10-CM

## 2013-12-16 DIAGNOSIS — N951 Menopausal and female climacteric states: Secondary | ICD-10-CM

## 2013-12-16 DIAGNOSIS — R945 Abnormal results of liver function studies: Secondary | ICD-10-CM

## 2013-12-16 DIAGNOSIS — F439 Reaction to severe stress, unspecified: Secondary | ICD-10-CM

## 2013-12-16 DIAGNOSIS — R7989 Other specified abnormal findings of blood chemistry: Secondary | ICD-10-CM

## 2013-12-16 NOTE — Progress Notes (Signed)
Pre visit review using our clinic review tool, if applicable. No additional management support is needed unless otherwise documented below in the visit note. 

## 2013-12-17 ENCOUNTER — Encounter: Payer: Self-pay | Admitting: Internal Medicine

## 2013-12-17 ENCOUNTER — Telehealth: Payer: Self-pay | Admitting: Internal Medicine

## 2013-12-17 NOTE — Telephone Encounter (Signed)
Pt left without scheduling a f/u appt.  Please schedule a f/u appt (95min) in 4 months.  Schedule late pm.  Thanks.

## 2013-12-17 NOTE — Progress Notes (Signed)
Subjective:    [name], female    DOB: 10-14-1950, 63 y.o.   MRN: [id number]  HPI 63 year old female with past history of hiatal hernia, depression, migraine headaches, hypercholesterolemia and sarcoidosis (followed by Dr Rico Ala).  She comes in today for a scheduled follow up.  She still reports increased stress.  Work stress and home stress.  We discussed this at length again today.  States "things at home" are better.  Some depression.  Does not sleep as well.  Seeing Dr Nicolasa Ducking.  On zoloft now.  Doing some better.  Trying to watch her diet.  Did not get her labs drawn prior to this visit.      Past Medical History  Diagnosis Date  . Migraines   . Hiatal hernia with gastroesophageal reflux   . Depression   . Sarcoidosis   . Hypercholesterolemia     Current Outpatient Prescriptions on File Prior to Visit  Medication Sig Dispense Refill  . cyclobenzaprine (FLEXERIL) 5 MG tablet Take 1 tablet (5 mg total) by mouth at bedtime as needed for muscle spasms. 20 tablet 0  . estradiol (ESTRACE) 0.1 MG/GM vaginal cream q hs x 5 nights and then twice a week 42.5 g 1  . estradiol (ESTRACE) 1 MG tablet TAKE ONE TABLET BY MOUTH ONCE DAILY 30 tablet 5  . fish oil-omega-3 fatty acids 1000 MG capsule Take 1 g by mouth 3 (three) times daily.    . fluticasone (FLONASE) 50 MCG/ACT nasal spray Place 2 sprays into both nostrils daily. 16 g 1  . mometasone (NASONEX) 50 MCG/ACT nasal spray Place 2 sprays into the nose daily.    Marland Kitchen omeprazole (PRILOSEC) 20 MG capsule TAKE 1 CAPSULE TWICE DAILY 60 capsule 5  . venlafaxine XR (EFFEXOR-XR) 75 MG 24 hr capsule TAKE ONE CAPSULE BY MOUTH ONCE DAILY 30 capsule 0  . VESICARE 10 MG tablet TAKE ONE TABLET BY MOUTH ONCE DAILY 30 tablet 0  . zolmitriptan (ZOMIG) 5 MG tablet Take 0.5 tablets (2.5 mg total) by mouth as needed. 10 tablet 2   No current facility-administered medications on file prior to visit.    Review of Systems No sinus or allergy  symptoms.  Breathing stable.  No chest pain, tightness or palpitations.  No increased shortness of breath.  Has been exercising.  Has adjusted her diet.  Increased stress and depression as outlined.  Some better.  Seeing Dr Nicolasa Ducking.  On zoloft.  Planning to see a Social worker.   Overall appears to be doing better.            Objective:   Physical Exam  Filed Vitals:   12/16/13 1620  BP: 130/70  Pulse: 67  Temp: 98.2 F (75.73 C)   63 year old female in no acute distress.   HEENT:  Nares- clear.  Oropharynx - without lesions. NECK:  Supple.  Nontender.  No audible bruit.  HEART:  Appears to be regular. LUNGS:  No crackles or wheezing audible.  Respirations even and unlabored.  RADIAL PULSE:  Equal bilaterally. ABDOMEN:  Soft, nontender.  Bowel sounds present and normal.  No audible abdominal bruit.   EXTREMITIES:  No increased edema present.  DP pulses palpable and equal bilaterally.          Assessment & Plan:  Gastroesophageal reflux disease, esophagitis presence not specified No problems reported.  On prilosec.    Sarcoidosis Followed by Dr Raul Del.  CT scans have revealed stable pulmonary nodules as  well as stable enlarged mediastinal lymph nodes.  He is following her DLCO and lung function.  Breathing stable.    Hypercholesterolemia Has elected to work on diet and exercise.  Planning to get her labs soon.    Menopausal syndrome Appears to be doing better.  Follow.    Abnormal liver function test Has tried to adjust her diet and lose weight.  Recheck liver panel.   Stress Increased stress as outlined.  Seeing Dr Nicolasa Ducking.  On zoloft and trazodone.  Information given to see a counselor.  Follow.  Doing some better.    MSK.  Back better.     GYN.  Sees gyn for her pelvic and pap smears.  States she is up to date.  Follow.    CARDIOVASCULAR.  Asymptomatic.    GI.  Colonoscopy 05/19/03 - normal.  Follow.         HEALTH MAINTENANCE.  Breasts, pelvic and pap smears through gyn.   States she is up to date.  Mammogram 06/06/13 - Birads I.  Colonoscopy 05/19/03 - normal.

## 2014-01-03 ENCOUNTER — Other Ambulatory Visit: Payer: Self-pay | Admitting: Internal Medicine

## 2014-02-14 ENCOUNTER — Encounter: Payer: Self-pay | Admitting: Internal Medicine

## 2014-02-18 ENCOUNTER — Other Ambulatory Visit: Payer: Self-pay | Admitting: Internal Medicine

## 2014-03-28 ENCOUNTER — Other Ambulatory Visit: Payer: Self-pay | Admitting: Internal Medicine

## 2014-04-14 ENCOUNTER — Other Ambulatory Visit: Payer: Self-pay | Admitting: Internal Medicine

## 2014-04-18 ENCOUNTER — Encounter: Payer: Self-pay | Admitting: Internal Medicine

## 2014-04-19 LAB — BASIC METABOLIC PANEL
BUN: 19 mg/dL (ref 4–21)
CREATININE: 0.8 mg/dL (ref 0.5–1.1)
GLUCOSE: 102 mg/dL
POTASSIUM: 4.1 mmol/L (ref 3.4–5.3)
Sodium: 139 mmol/L (ref 137–147)

## 2014-04-19 LAB — CBC AND DIFFERENTIAL
HEMATOCRIT: 41 % (ref 36–46)
Hemoglobin: 13.8 g/dL (ref 12.0–16.0)
Neutrophils Absolute: 4 /uL
Platelets: 249 10*3/uL (ref 150–399)
WBC: 7.3 10^3/mL

## 2014-04-19 LAB — TSH: TSH: 2.97 u[IU]/mL (ref 0.41–5.90)

## 2014-04-19 LAB — HEMOGLOBIN A1C: HEMOGLOBIN A1C: 5.8 % (ref 4.0–6.0)

## 2014-04-19 LAB — LIPID PANEL
Cholesterol: 257 mg/dL — AB (ref 0–200)
HDL: 44 mg/dL (ref 35–70)
LDL Cholesterol: 146 mg/dL
LDL/HDL RATIO: 5.8
TRIGLYCERIDES: 336 mg/dL — AB (ref 40–160)

## 2014-04-19 LAB — HEPATIC FUNCTION PANEL
ALK PHOS: 47 U/L (ref 25–125)
ALT: 22 U/L (ref 7–35)
AST: 19 U/L (ref 13–35)
BILIRUBIN, TOTAL: 0.4 mg/dL

## 2014-04-21 ENCOUNTER — Encounter: Payer: Self-pay | Admitting: Internal Medicine

## 2014-04-21 ENCOUNTER — Ambulatory Visit (INDEPENDENT_AMBULATORY_CARE_PROVIDER_SITE_OTHER): Payer: No Typology Code available for payment source | Admitting: Internal Medicine

## 2014-04-21 VITALS — BP 130/80 | HR 67 | Temp 97.8°F | Ht 64.0 in | Wt 176.2 lb

## 2014-04-21 DIAGNOSIS — N951 Menopausal and female climacteric states: Secondary | ICD-10-CM

## 2014-04-21 DIAGNOSIS — R945 Abnormal results of liver function studies: Secondary | ICD-10-CM

## 2014-04-21 DIAGNOSIS — IMO0001 Reserved for inherently not codable concepts without codable children: Secondary | ICD-10-CM

## 2014-04-21 DIAGNOSIS — K219 Gastro-esophageal reflux disease without esophagitis: Secondary | ICD-10-CM

## 2014-04-21 DIAGNOSIS — E7800 Pure hypercholesterolemia, unspecified: Secondary | ICD-10-CM

## 2014-04-21 DIAGNOSIS — M8580 Other specified disorders of bone density and structure, unspecified site: Secondary | ICD-10-CM

## 2014-04-21 DIAGNOSIS — R7989 Other specified abnormal findings of blood chemistry: Secondary | ICD-10-CM

## 2014-04-21 DIAGNOSIS — D869 Sarcoidosis, unspecified: Secondary | ICD-10-CM

## 2014-04-21 DIAGNOSIS — R030 Elevated blood-pressure reading, without diagnosis of hypertension: Secondary | ICD-10-CM

## 2014-04-21 NOTE — Progress Notes (Signed)
Pre visit review using our clinic review tool, if applicable. No additional management support is needed unless otherwise documented below in the visit note. 

## 2014-04-30 ENCOUNTER — Encounter: Payer: Self-pay | Admitting: Internal Medicine

## 2014-04-30 NOTE — Assessment & Plan Note (Signed)
Liver function tests wnl on recent check.  Follow.

## 2014-04-30 NOTE — Progress Notes (Signed)
Patient ID: MYIESHA EDGAR, female   DOB: June 24, 1950, 64 y.o.   MRN: [id number]   Subjective:    Patient ID: [name], female    DOB: [birth year], 64 y.o.   MRN: [id number]  HPI  Patient here for a scheduled follow up.  Still with some increased stress.  Is doing better.  On effexor.  Trying to watch her diet.  No cardiac symptoms with increased activity or exertion.   Breathing stable.  Bowels stable.  Discussed diet and exercise.     Past Medical History  Diagnosis Date  . Migraines   . Hiatal hernia with gastroesophageal reflux   . Depression   . Sarcoidosis   . Hypercholesterolemia     Outpatient Encounter Prescriptions as of 04/21/2014  Medication Sig  . cyclobenzaprine (FLEXERIL) 5 MG tablet Take 1 tablet (5 mg total) by mouth at bedtime as needed for muscle spasms.  Marland Kitchen estradiol (ESTRACE) 0.1 MG/GM vaginal cream q hs x 5 nights and then twice a week  . estradiol (ESTRACE) 1 MG tablet TAKE ONE TABLET BY MOUTH ONCE DAILY  . fish oil-omega-3 fatty acids 1000 MG capsule Take 1 g by mouth 3 (three) times daily.  . fluticasone (FLONASE) 50 MCG/ACT nasal spray Place 2 sprays into both nostrils daily.  . mometasone (NASONEX) 50 MCG/ACT nasal spray Place 2 sprays into the nose daily.  Marland Kitchen omeprazole (PRILOSEC) 20 MG capsule TAKE ONE (1) CAPSULE BY MOUTH 2 TIMES DAILY  . sertraline (ZOLOFT) 50 MG tablet Take 50 mg by mouth daily.  Marland Kitchen venlafaxine XR (EFFEXOR-XR) 75 MG 24 hr capsule TAKE ONE CAPSULE BY MOUTH ONCE DAILY  . VESICARE 10 MG tablet TAKE ONE TABLET BY MOUTH ONCE DAILY  . zolmitriptan (ZOMIG) 5 MG tablet Take 0.5 tablets (2.5 mg total) by mouth as needed.    Review of Systems  Constitutional: Negative for appetite change and unexpected weight change.  HENT: Negative for congestion and sinus pressure.   Respiratory: Negative for cough, chest tightness and shortness of breath.   Cardiovascular: Negative for chest pain, palpitations and leg swelling.  Gastrointestinal: Negative  for nausea, vomiting, abdominal pain and diarrhea.  Neurological: Negative for dizziness, light-headedness and headaches.  Psychiatric/Behavioral: Negative for dysphoric mood and agitation.       Objective:     Blood pressure recheck:  118/72  Physical Exam  Constitutional: She appears well-developed and well-nourished. No distress.  HENT:  Nose: Nose normal.  Mouth/Throat: Oropharynx is clear and moist.  Neck: Neck supple. No thyromegaly present.  Cardiovascular: Normal rate and regular rhythm.   Pulmonary/Chest: Breath sounds normal. No respiratory distress. She has no wheezes.  Abdominal: Soft. Bowel sounds are normal. There is no tenderness.  Musculoskeletal: She exhibits no edema or tenderness.  Lymphadenopathy:    She has no cervical adenopathy.  Skin: No rash noted. No erythema.  Psychiatric: She has a normal mood and affect. Her behavior is normal.    BP 130/80 mmHg  Pulse 67  Temp(Src) 97.8 F (36.6 C) (Oral)  Ht 5\' 4"  (1.626 m)  Wt 176 lb 4 oz (79.946 kg)  BMI 30.24 kg/m2  SpO2 95%  LMP 12/14/1984 Wt Readings from Last 3 Encounters:  04/21/14 176 lb 4 oz (79.946 kg)  12/16/13 172 lb (78.019 kg)  09/13/13 166 lb 4 oz (75.411 kg)     Lab Results  Component Value Date   WBC 7.3 04/19/2014   HGB 13.8 04/19/2014   HCT 41 04/19/2014   PLT  249 04/19/2014   CHOL 257* 04/19/2014   TRIG 336* 04/19/2014   HDL 44 04/19/2014   LDLCALC 146 04/19/2014   ALT 22 04/19/2014   AST 19 04/19/2014   NA 139 04/19/2014   K 4.1 04/19/2014   CREATININE 0.8 04/19/2014   BUN 19 04/19/2014   TSH 2.97 04/19/2014   HGBA1C 5.8 04/19/2014       Assessment & Plan:   Problem List Items Addressed This Visit    Abnormal liver function test    Liver function tests wnl on recent check.  Follow.        Elevated blood pressure    Blood pressure doing well.  Follow.        GERD (gastroesophageal reflux disease) - Primary    EGD as outlined.  Recommended continuing  omeprazole.  Follow.  Stable.       Hypercholesterolemia    Low cholesterol diet and exercise.  LDL decreased - 146.  Desires no cholesterol medication.  Follow.        Menopausal syndrome    Stable on current regimen.        Osteopenia    Continue calcium, vitamin D and weight bearing exercise.        Sarcoidosis    Followed by Dr Raul Del.  Breathing stable.          I spent 25 minutes with the patient and more than 50% of the time was spent in consultation regarding the above.     Einar Pheasant, MD

## 2014-04-30 NOTE — Assessment & Plan Note (Signed)
Blood pressure doing well.  Follow.  

## 2014-04-30 NOTE — Assessment & Plan Note (Signed)
Low cholesterol diet and exercise.  LDL decreased - 146.  Desires no cholesterol medication.  Follow.

## 2014-04-30 NOTE — Assessment & Plan Note (Signed)
EGD as outlined.  Recommended continuing omeprazole.  Follow.  Stable.

## 2014-04-30 NOTE — Assessment & Plan Note (Signed)
Followed by Dr Fleming.  Breathing stable.   

## 2014-04-30 NOTE — Assessment & Plan Note (Signed)
Stable on current regimen   

## 2014-04-30 NOTE — Assessment & Plan Note (Signed)
Continue calcium, vitamin D and weight bearing exercise.  

## 2014-05-01 ENCOUNTER — Other Ambulatory Visit: Payer: Self-pay | Admitting: Internal Medicine

## 2014-06-05 ENCOUNTER — Other Ambulatory Visit: Payer: Self-pay | Admitting: Internal Medicine

## 2014-07-03 ENCOUNTER — Other Ambulatory Visit: Payer: Self-pay | Admitting: Internal Medicine

## 2014-07-03 DIAGNOSIS — Z1231 Encounter for screening mammogram for malignant neoplasm of breast: Secondary | ICD-10-CM

## 2014-07-06 ENCOUNTER — Ambulatory Visit
Admission: RE | Admit: 2014-07-06 | Discharge: 2014-07-06 | Disposition: A | Discharge status: Home / Self Care | Payer: No Typology Code available for payment source | Source: Ambulatory Visit | Attending: Internal Medicine | Admitting: Internal Medicine

## 2014-07-06 ENCOUNTER — Encounter: Payer: Self-pay | Admitting: Internal Medicine

## 2014-07-06 DIAGNOSIS — Z1231 Encounter for screening mammogram for malignant neoplasm of breast: Secondary | ICD-10-CM | POA: Diagnosis not present

## 2014-07-06 DIAGNOSIS — Z0000 Encounter for general adult medical examination without abnormal findings: Secondary | ICD-10-CM | POA: Insufficient documentation

## 2014-07-06 HISTORY — DX: Malignant (primary) neoplasm, unspecified: C80.1

## 2014-08-28 ENCOUNTER — Encounter: Payer: Self-pay | Admitting: Internal Medicine

## 2014-08-29 NOTE — Telephone Encounter (Signed)
Lab order faxed to [facility]

## 2014-08-29 NOTE — Telephone Encounter (Signed)
Lab order completed.  Pt wants faxed to number in my chart message.  Thanks  Order form in [facility].

## 2014-08-30 LAB — CBC AND DIFFERENTIAL
HCT: 41 % (ref 36–46)
Hemoglobin: 13.8 g/dL (ref 12.0–16.0)
Neutrophils Absolute: 3 /uL
Platelets: 245 10*3/uL (ref 150–399)
WBC: 5 10^3/mL

## 2014-08-30 LAB — HEPATIC FUNCTION PANEL
ALT: 29 U/L (ref 7–35)
AST: 25 U/L (ref 13–35)
Alkaline Phosphatase: 49 U/L (ref 25–125)
BILIRUBIN, TOTAL: 0.4 mg/dL

## 2014-08-30 LAB — LIPID PANEL
Cholesterol: 266 mg/dL — AB (ref 0–200)
HDL: 45 mg/dL (ref 35–70)
LDL CALC: 178 mg/dL
TRIGLYCERIDES: 216 mg/dL — AB (ref 40–160)

## 2014-08-30 LAB — TSH: TSH: 2.73 u[IU]/mL (ref 0.41–5.90)

## 2014-08-30 LAB — HEMOGLOBIN A1C: HEMOGLOBIN A1C: 5.8 % (ref 4.0–6.0)

## 2014-08-30 LAB — BASIC METABOLIC PANEL
BUN: 16 mg/dL (ref 4–21)
CREATININE: 0.8 mg/dL (ref 0.5–1.1)
GLUCOSE: 107 mg/dL
Potassium: 4.2 mmol/L (ref 3.4–5.3)
SODIUM: 141 mmol/L (ref 137–147)

## 2014-08-31 ENCOUNTER — Encounter: Payer: Self-pay | Admitting: Internal Medicine

## 2014-08-31 ENCOUNTER — Ambulatory Visit (INDEPENDENT_AMBULATORY_CARE_PROVIDER_SITE_OTHER): Payer: No Typology Code available for payment source | Admitting: Internal Medicine

## 2014-08-31 VITALS — BP 140/72 | HR 73 | Temp 98.2°F | Ht 64.0 in | Wt 180.4 lb

## 2014-08-31 DIAGNOSIS — Z658 Other specified problems related to psychosocial circumstances: Secondary | ICD-10-CM

## 2014-08-31 DIAGNOSIS — R945 Abnormal results of liver function studies: Secondary | ICD-10-CM

## 2014-08-31 DIAGNOSIS — R0609 Other forms of dyspnea: Secondary | ICD-10-CM | POA: Diagnosis not present

## 2014-08-31 DIAGNOSIS — E7800 Pure hypercholesterolemia, unspecified: Secondary | ICD-10-CM

## 2014-08-31 DIAGNOSIS — IMO0001 Reserved for inherently not codable concepts without codable children: Secondary | ICD-10-CM

## 2014-08-31 DIAGNOSIS — F439 Reaction to severe stress, unspecified: Secondary | ICD-10-CM

## 2014-08-31 DIAGNOSIS — R030 Elevated blood-pressure reading, without diagnosis of hypertension: Secondary | ICD-10-CM

## 2014-08-31 DIAGNOSIS — D869 Sarcoidosis, unspecified: Secondary | ICD-10-CM

## 2014-08-31 DIAGNOSIS — R7989 Other specified abnormal findings of blood chemistry: Secondary | ICD-10-CM | POA: Diagnosis not present

## 2014-08-31 DIAGNOSIS — K219 Gastro-esophageal reflux disease without esophagitis: Secondary | ICD-10-CM | POA: Diagnosis not present

## 2014-08-31 DIAGNOSIS — R079 Chest pain, unspecified: Secondary | ICD-10-CM

## 2014-08-31 MED ORDER — PRAVASTATIN SODIUM 10 MG PO TABS: 10.0000 mg | ORAL_TABLET | Freq: Every day | ORAL | Status: DC

## 2014-08-31 NOTE — Progress Notes (Signed)
Pre-visit discussion using our clinic review tool. No additional management support is needed unless otherwise documented below in the visit note.  

## 2014-08-31 NOTE — Patient Instructions (Signed)
Zantac (ranitidine) 150mg  - take one tablet per day - take this 30 minutes before her evening meal.
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
## 2014-09-03 ENCOUNTER — Encounter: Payer: Self-pay | Admitting: Internal Medicine

## 2014-09-03 DIAGNOSIS — R079 Chest pain, unspecified: Secondary | ICD-10-CM | POA: Insufficient documentation

## 2014-09-03 NOTE — Assessment & Plan Note (Signed)
Seeing Dr Nicolasa Ducking.  On zolft and effexor.  Follow.

## 2014-09-03 NOTE — Assessment & Plan Note (Signed)
Recent liver panel wnl.  Discussed diet and exercise.  Follow.

## 2014-09-03 NOTE — Assessment & Plan Note (Signed)
Followed by Dr Fleming.  Breathing stable.  Follow.  

## 2014-09-03 NOTE — Assessment & Plan Note (Signed)
Had EGD 10/01/12 as outlined in overview.  Add zantac in the pm.  Continue omeprazole in the am.

## 2014-09-03 NOTE — Assessment & Plan Note (Signed)
Follow pressure.

## 2014-09-03 NOTE — Assessment & Plan Note (Signed)
Elevated cholesterol.  Discussed treatment.  Agreed to cholesterol medication.  Start pravastatin 10mg  q day.  Follow.

## 2014-09-03 NOTE — Assessment & Plan Note (Signed)
Had the episode of chest pain as outlined.  Some sob with exertion.  EKG obtained and revealed SR with no acute ischemic changes.  Obtain stress echo.

## 2014-09-07 ENCOUNTER — Telehealth: Payer: Self-pay

## 2014-09-07 NOTE — Telephone Encounter (Signed)
L MOM to see if pt can come tomorrow for stress echo at 4 pm

## 2014-09-08 ENCOUNTER — Ambulatory Visit (INDEPENDENT_AMBULATORY_CARE_PROVIDER_SITE_OTHER): Payer: No Typology Code available for payment source

## 2014-09-08 DIAGNOSIS — R079 Chest pain, unspecified: Secondary | ICD-10-CM

## 2014-09-08 DIAGNOSIS — R0609 Other forms of dyspnea: Secondary | ICD-10-CM

## 2014-09-08 LAB — ECHOCARDIOGRAM STRESS TEST
CHL CUP RESTING HR STRESS: 71 {beats}/min
CSEPED: 9 min
CSEPHR: 102 %
CSEPPHR: 160 {beats}/min
Estimated workload: 11.3 METS
Exercise duration (sec): 46 s
MPHR: 156 {beats}/min

## 2014-09-09 ENCOUNTER — Encounter: Payer: Self-pay | Admitting: Internal Medicine

## 2014-10-10 ENCOUNTER — Encounter: Payer: Self-pay | Admitting: Internal Medicine

## 2014-10-10 DIAGNOSIS — Z1211 Encounter for screening for malignant neoplasm of colon: Secondary | ICD-10-CM

## 2014-10-11 NOTE — Telephone Encounter (Signed)
Order for GI referral placed.  Pt notified via my chart.

## 2014-10-11 NOTE — Telephone Encounter (Signed)
Order placed for GI referral for screening colonoscopy.

## 2014-11-28 ENCOUNTER — Other Ambulatory Visit: Payer: Self-pay | Admitting: Internal Medicine

## 2014-11-28 ENCOUNTER — Encounter: Payer: Self-pay | Admitting: Internal Medicine

## 2014-11-28 MED ORDER — SERTRALINE HCL 50 MG PO TABS: 50.0000 mg | ORAL_TABLET | Freq: Every day | ORAL | Status: DC

## 2014-12-04 ENCOUNTER — Encounter: Payer: Self-pay | Admitting: Internal Medicine

## 2014-12-04 ENCOUNTER — Ambulatory Visit (INDEPENDENT_AMBULATORY_CARE_PROVIDER_SITE_OTHER): Payer: PRIVATE HEALTH INSURANCE | Admitting: Internal Medicine

## 2014-12-04 VITALS — BP 118/80 | HR 67 | Temp 98.1°F | Resp 18 | Ht 64.0 in | Wt 179.5 lb

## 2014-12-04 DIAGNOSIS — D869 Sarcoidosis, unspecified: Secondary | ICD-10-CM | POA: Diagnosis not present

## 2014-12-04 DIAGNOSIS — R945 Abnormal results of liver function studies: Secondary | ICD-10-CM

## 2014-12-04 DIAGNOSIS — R7989 Other specified abnormal findings of blood chemistry: Secondary | ICD-10-CM

## 2014-12-04 DIAGNOSIS — K219 Gastro-esophageal reflux disease without esophagitis: Secondary | ICD-10-CM

## 2014-12-04 DIAGNOSIS — F439 Reaction to severe stress, unspecified: Secondary | ICD-10-CM

## 2014-12-04 DIAGNOSIS — E7800 Pure hypercholesterolemia, unspecified: Secondary | ICD-10-CM

## 2014-12-04 DIAGNOSIS — R32 Unspecified urinary incontinence: Secondary | ICD-10-CM

## 2014-12-04 DIAGNOSIS — M722 Plantar fascial fibromatosis: Secondary | ICD-10-CM

## 2014-12-04 DIAGNOSIS — Z658 Other specified problems related to psychosocial circumstances: Secondary | ICD-10-CM | POA: Diagnosis not present

## 2014-12-04 NOTE — Assessment & Plan Note (Signed)
Did not tolerate pravastatin.  On no medication now.  Low cholesterol diet and exercise.  Follow lipid panel.

## 2014-12-04 NOTE — Assessment & Plan Note (Signed)
EGD 10/01/12 - normal.  On omeprazole.  No upper symptoms reported.

## 2014-12-04 NOTE — Progress Notes (Signed)
Pre-visit discussion using our clinic review tool. No additional management support is needed unless otherwise documented below in the visit note.  

## 2014-12-04 NOTE — Assessment & Plan Note (Signed)
Has seen Dr Lonia Blood.  On vesicare.

## 2014-12-04 NOTE — Assessment & Plan Note (Signed)
Followed by Dr Fleming.  Breathing stable.   

## 2014-12-04 NOTE — Assessment & Plan Note (Signed)
Recheck liver panel with next labs.
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
## 2014-12-04 NOTE — Assessment & Plan Note (Signed)
Seeing Dr Elvina Mattes.  Wrapped now.  Better.

## 2014-12-04 NOTE — Assessment & Plan Note (Signed)
Was seeing Dr Nicolasa Ducking.  Feeling better on zoloft.  Has been taking effexor as well.  Follow.

## 2014-12-11 ENCOUNTER — Other Ambulatory Visit: Payer: Self-pay

## 2014-12-11 DIAGNOSIS — E7800 Pure hypercholesterolemia, unspecified: Secondary | ICD-10-CM

## 2014-12-11 DIAGNOSIS — R739 Hyperglycemia, unspecified: Secondary | ICD-10-CM

## 2014-12-11 NOTE — Progress Notes (Signed)
Patient came in to have blood drawn per Dr. Einar Pheasant at [facility] in [place].  Blood was drawn from the right arm without any incident.  Pt wants me to fax her results to Dr. Nicki Reaper and she also wants a copy sent to herself.

## 2014-12-12 ENCOUNTER — Encounter: Payer: Self-pay | Admitting: Emergency Medicine

## 2014-12-12 LAB — CMP12+LP+TP+TSH+6AC+CBC/D/PLT
ALBUMIN: 4.2 g/dL (ref 3.6–4.8)
ALT: 21 IU/L (ref 0–32)
AST: 21 IU/L (ref 0–40)
Albumin/Globulin Ratio: 1.8 (ref 1.1–2.5)
Alkaline Phosphatase: 44 IU/L (ref 39–117)
BASOS: 0 %
BUN / CREAT RATIO: 30 — AB (ref 11–26)
BUN: 20 mg/dL (ref 8–27)
Basophils Absolute: 0 10*3/uL (ref 0.0–0.2)
Bilirubin Total: 0.4 mg/dL (ref 0.0–1.2)
CALCIUM: 8.8 mg/dL (ref 8.7–10.3)
CHOL/HDL RATIO: 6.3 ratio — AB (ref 0.0–4.4)
CHOLESTEROL TOTAL: 245 mg/dL — AB (ref 100–199)
Chloride: 102 mmol/L (ref 97–106)
Creatinine, Ser: 0.67 mg/dL (ref 0.57–1.00)
EOS (ABSOLUTE): 0.1 10*3/uL (ref 0.0–0.4)
EOS: 2 %
ESTIMATED CHD RISK: 1.8 times avg. — AB (ref 0.0–1.0)
Free Thyroxine Index: 1.5 (ref 1.2–4.9)
GFR calc Af Amer: 107 mL/min/{1.73_m2} (ref >59)
GFR calc non Af Amer: 93 mL/min/{1.73_m2} (ref >59)
GGT: 33 IU/L (ref 0–60)
GLOBULIN, TOTAL: 2.4 g/dL (ref 1.5–4.5)
Glucose: 103 mg/dL — ABNORMAL HIGH (ref 65–99)
HDL: 39 mg/dL — ABNORMAL LOW (ref >39)
HEMATOCRIT: 40.6 % (ref 34.0–46.6)
HEMOGLOBIN: 13.9 g/dL (ref 11.1–15.9)
IMMATURE GRANS (ABS): 0 10*3/uL (ref 0.0–0.1)
Immature Granulocytes: 0 %
Iron: 102 ug/dL (ref 27–139)
LDH: 147 IU/L (ref 119–226)
LDL CALC: 157 mg/dL — AB (ref 0–99)
LYMPHS: 40 %
Lymphocytes Absolute: 2.4 10*3/uL (ref 0.7–3.1)
MCH: 30.4 pg (ref 26.6–33.0)
MCHC: 34.2 g/dL (ref 31.5–35.7)
MCV: 89 fL (ref 79–97)
MONOS ABS: 0.3 10*3/uL (ref 0.1–0.9)
Monocytes: 6 %
NEUTROS ABS: 3 10*3/uL (ref 1.4–7.0)
Neutrophils: 52 %
PHOSPHORUS: 2.8 mg/dL (ref 2.5–4.5)
POTASSIUM: 4.3 mmol/L (ref 3.5–5.2)
Platelets: 244 10*3/uL (ref 150–379)
RBC: 4.57 x10E6/uL (ref 3.77–5.28)
RDW: 13.1 % (ref 12.3–15.4)
SODIUM: 140 mmol/L (ref 136–144)
T3 Uptake Ratio: 22 % — ABNORMAL LOW (ref 24–39)
T4, Total: 7 ug/dL (ref 4.5–12.0)
TRIGLYCERIDES: 245 mg/dL — AB (ref 0–149)
TSH: 2.29 u[IU]/mL (ref 0.450–4.500)
Total Protein: 6.6 g/dL (ref 6.0–8.5)
Uric Acid: 4.1 mg/dL (ref 2.5–7.1)
VLDL Cholesterol Cal: 49 mg/dL — ABNORMAL HIGH (ref 5–40)
WBC: 5.9 10*3/uL (ref 3.4–10.8)

## 2014-12-12 LAB — HGB A1C W/O EAG: Hgb A1c MFr Bld: 5.9 % — ABNORMAL HIGH (ref 4.8–5.6)

## 2014-12-12 NOTE — Progress Notes (Signed)
Lab results were faxed to Dr. Nicki Reaper at [facility] and a copy was mailed to patient per her request.

## 2014-12-15 ENCOUNTER — Encounter: Payer: Self-pay | Admitting: *Deleted

## 2014-12-18 ENCOUNTER — Encounter: Payer: Self-pay | Admitting: *Deleted

## 2014-12-18 ENCOUNTER — Ambulatory Visit: Payer: PRIVATE HEALTH INSURANCE | Admitting: Anesthesiology

## 2014-12-18 ENCOUNTER — Ambulatory Visit
Admission: RE | Admit: 2014-12-18 | Discharge: 2014-12-18 | Disposition: A | Discharge status: Home / Self Care | Payer: PRIVATE HEALTH INSURANCE | Source: Ambulatory Visit | Attending: Gastroenterology | Admitting: Gastroenterology

## 2014-12-18 ENCOUNTER — Encounter
Admission: RE | Disposition: A | Discharge status: Home / Self Care | Payer: Self-pay | Source: Ambulatory Visit | Attending: Gastroenterology

## 2014-12-18 DIAGNOSIS — D122 Benign neoplasm of ascending colon: Secondary | ICD-10-CM | POA: Insufficient documentation

## 2014-12-18 DIAGNOSIS — E7800 Pure hypercholesterolemia, unspecified: Secondary | ICD-10-CM | POA: Diagnosis not present

## 2014-12-18 DIAGNOSIS — G43909 Migraine, unspecified, not intractable, without status migrainosus: Secondary | ICD-10-CM | POA: Insufficient documentation

## 2014-12-18 DIAGNOSIS — Z888 Allergy status to other drugs, medicaments and biological substances status: Secondary | ICD-10-CM | POA: Insufficient documentation

## 2014-12-18 DIAGNOSIS — K449 Diaphragmatic hernia without obstruction or gangrene: Secondary | ICD-10-CM | POA: Diagnosis not present

## 2014-12-18 DIAGNOSIS — F329 Major depressive disorder, single episode, unspecified: Secondary | ICD-10-CM | POA: Insufficient documentation

## 2014-12-18 DIAGNOSIS — Z1211 Encounter for screening for malignant neoplasm of colon: Secondary | ICD-10-CM | POA: Diagnosis present

## 2014-12-18 DIAGNOSIS — Z79899 Other long term (current) drug therapy: Secondary | ICD-10-CM | POA: Diagnosis not present

## 2014-12-18 DIAGNOSIS — K644 Residual hemorrhoidal skin tags: Secondary | ICD-10-CM | POA: Diagnosis not present

## 2014-12-18 DIAGNOSIS — Z811 Family history of alcohol abuse and dependence: Secondary | ICD-10-CM | POA: Insufficient documentation

## 2014-12-18 DIAGNOSIS — K219 Gastro-esophageal reflux disease without esophagitis: Secondary | ICD-10-CM | POA: Insufficient documentation

## 2014-12-18 DIAGNOSIS — D869 Sarcoidosis, unspecified: Secondary | ICD-10-CM | POA: Insufficient documentation

## 2014-12-18 DIAGNOSIS — Z8541 Personal history of malignant neoplasm of cervix uteri: Secondary | ICD-10-CM | POA: Diagnosis not present

## 2014-12-18 DIAGNOSIS — Z8249 Family history of ischemic heart disease and other diseases of the circulatory system: Secondary | ICD-10-CM | POA: Diagnosis not present

## 2014-12-18 HISTORY — PX: COLONOSCOPY WITH PROPOFOL: SHX5780

## 2014-12-18 LAB — HM COLONOSCOPY

## 2014-12-18 SURGERY — COLONOSCOPY WITH PROPOFOL
Anesthesia: General

## 2014-12-18 MED ORDER — PHENYLEPHRINE HCL 10 MG/ML IJ SOLN
INTRAMUSCULAR | Status: DC | PRN
  Administered 2014-12-18 (×2): 100 ug via INTRAVENOUS

## 2014-12-18 MED ORDER — FENTANYL CITRATE (PF) 100 MCG/2ML IJ SOLN
INTRAMUSCULAR | Status: DC | PRN
  Administered 2014-12-18 (×2): 50 ug via INTRAVENOUS

## 2014-12-18 MED ORDER — PROPOFOL 500 MG/50ML IV EMUL
INTRAVENOUS | Status: DC | PRN
  Administered 2014-12-18: 100 ug/kg/min via INTRAVENOUS

## 2014-12-18 MED ORDER — MIDAZOLAM HCL 2 MG/2ML IJ SOLN
INTRAMUSCULAR | Status: DC | PRN
  Administered 2014-12-18: 2 mg via INTRAVENOUS

## 2014-12-18 MED ORDER — SODIUM CHLORIDE 0.9 % IV SOLN
INTRAVENOUS | Status: DC
  Administered 2014-12-18: 1000 mL via INTRAVENOUS
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
## 2014-12-18 NOTE — Op Note (Signed)
[facility] Gastroenterology Patient Name: Christy Escobar Procedure Date: 12/18/2014 9:11 AM MRN: [id number] Account #: [id number] Date of Birth: June 14, 1950 Admit Type: Outpatient Age: 64 Room: [facility] ENDO ROOM 2 Gender: Female Note Status: Finalized Procedure:         Colonoscopy Indications:       Screening for colorectal malignant neoplasm, Last                     colonoscopy: 2005 Patient Profile:   This is a 64 year old female. Providers:         Gerrit Heck. Rayann Heman, MD Referring MD:      Einar Pheasant, MD (Referring MD) Medicines:         Propofol per Anesthesia Complications:     No immediate complications. Procedure:         Pre-Anesthesia Assessment:                    - Prior to the procedure, a History and Physical was                     performed, and patient medications, allergies and                     sensitivities were reviewed. The patient's tolerance of                     previous anesthesia was reviewed.                    After obtaining informed consent, the colonoscope was                     passed under direct vision. Throughout the procedure, the                     patient's blood pressure, pulse, and oxygen saturations                     were monitored continuously. The Olympus PCF-160AL                     Colonoscope (S#. [id number]) was introduced through the anus                     and advanced to the the cecum, identified by appendiceal                     orifice and ileocecal valve. The Colonoscope was                     introduced through the anus and advanced to the the cecum,                     identified by appendiceal orifice and ileocecal valve. The                     colonoscopy was performed without difficulty. The patient                     tolerated the procedure well. The quality of the bowel                     preparation was excellent. Findings:      The perianal exam findings include non-thrombosed external  hemorrhoids.  A 5 mm polyp was found in the proximal ascending colon. The polyp was       sessile. The polyp was removed with a cold snare. Resection and       retrieval were complete.      A 2 mm polyp was found in the proximal ascending colon. The polyp was       sessile. The polyp was removed with a jumbo cold forceps. Resection and       retrieval were complete.      The exam was otherwise without abnormality on direct and retroflexion       views. Impression:        - Non-thrombosed external hemorrhoids found on perianal                     exam.                    - One 5 mm polyp in the proximal ascending colon. Resected                     and retrieved.                    - One 2 mm polyp in the proximal ascending colon. Resected                     and retrieved.                    - The examination was otherwise normal on direct and                     retroflexion views. Recommendation:    - Observe patient in GI recovery unit.                    - Continue present medications.                    - Await pathology results.                    - Repeat colonoscopy for surveillance based on pathology                     results.                    - Return to referring physician.                    - The findings and recommendations were discussed with the                     patient.                    - The findings and recommendations were discussed with the                     patient's family. Procedure Code(s): --- Professional ---                    [phone], Colonoscopy, flexible; with removal of tumor(s),                     polyp(s), or other lesion(s) by snare technique  [id number], 50, Colonoscopy, flexible; with biopsy, single or                     multiple Diagnosis Code(s): --- Professional ---                    Z12.11, Encounter for screening for malignant neoplasm of                     colon                    D12.2, Benign neoplasm of  ascending colon                    K64.4, Residual hemorrhoidal skin tags CPT copyright 2014 [employer]. All rights reserved. The codes documented in this report are preliminary and upon coder review may  be revised to meet current compliance requirements. Mellody Life, MD 12/18/2014 9:42:51 AM This report has been signed electronically. Number of Addenda: 0 Note Initiated On: 12/18/2014 9:11 AM Scope Withdrawal Time: 0 hours 10 minutes 44 seconds  Total Procedure Duration: 0 hours 16 minutes 44 seconds       [facility]

## 2014-12-18 NOTE — Transfer of Care (Signed)
Immediate Anesthesia Transfer of Care Note  Patient: Christy Escobar  Procedure(s) Performed: Procedure(s): COLONOSCOPY WITH PROPOFOL (N/A)  Patient Location: PACU  Anesthesia Type:General  Level of Consciousness: sedated  Airway & Oxygen Therapy: Patient Spontanous Breathing and Patient connected to nasal cannula oxygen  Post-op Assessment: Report given to RN and Post -op Vital signs reviewed and stable  Post vital signs: Reviewed and stable  Last Vitals:  Filed Vitals:   12/18/14 0850  BP: 121/71  Pulse: 72  Temp: 36.6 C  Resp: 18    Complications: No apparent anesthesia complications

## 2014-12-18 NOTE — Anesthesia Postprocedure Evaluation (Signed)
Anesthesia Post Note  Patient: Christy Escobar  Procedure(s) Performed: Procedure(s) (LRB): COLONOSCOPY WITH PROPOFOL (N/A)  Patient location during evaluation: PACU Anesthesia Type: General Level of consciousness: awake and alert Pain management: satisfactory to patient Vital Signs Assessment: post-procedure vital signs reviewed and stable Respiratory status: respiratory function stable Cardiovascular status: stable Anesthetic complications: no    Last Vitals:  Filed Vitals:   12/18/14 0850 12/18/14 0944  BP: 121/71 84/62  Pulse: 72 68  Temp: 36.6 C 36 C  Resp: 18 12    Last Pain: There were no vitals filed for this visit.               VAN STAVEREN,Sevon Rotert

## 2014-12-18 NOTE — Anesthesia Preprocedure Evaluation (Signed)
Anesthesia Evaluation  Patient identified by MRN, date of birth, ID band Patient awake    Reviewed: Allergy & Precautions, NPO status , Patient's Chart, lab work & pertinent test results  Airway Mallampati: II       Dental  (+) Teeth Intact   Pulmonary neg pulmonary ROS,    Pulmonary exam normal        Cardiovascular negative cardio ROS   Rhythm:Regular     Neuro/Psych    GI/Hepatic Neg liver ROS, GERD  ,  Endo/Other  negative endocrine ROS  Renal/GU negative Renal ROS     Musculoskeletal negative musculoskeletal ROS (+)   Abdominal Normal abdominal exam  (+)   Peds negative pediatric ROS (+)  Hematology negative hematology ROS (+)   Anesthesia Other Findings   Reproductive/Obstetrics                             Anesthesia Physical Anesthesia Plan  ASA: I  Anesthesia Plan: General   Post-op Pain Management:    Induction: Intravenous  Airway Management Planned: Nasal Cannula  Additional Equipment:   Intra-op Plan:   Post-operative Plan:   Informed Consent: I have reviewed the patients History and Physical, chart, labs and discussed the procedure including the risks, benefits and alternatives for the proposed anesthesia with the patient or authorized representative who has indicated his/her understanding and acceptance.     Plan Discussed with: CRNA  Anesthesia Plan Comments:         Anesthesia Quick Evaluation

## 2014-12-18 NOTE — Discharge Instructions (Signed)

## 2014-12-19 LAB — SURGICAL PATHOLOGY

## 2014-12-20 ENCOUNTER — Encounter: Payer: Self-pay | Admitting: Gastroenterology

## 2014-12-20 ENCOUNTER — Encounter: Payer: Self-pay | Admitting: Internal Medicine

## 2014-12-20 DIAGNOSIS — Z86010 Personal history of colonic polyps: Secondary | ICD-10-CM | POA: Insufficient documentation

## 2014-12-21 ENCOUNTER — Other Ambulatory Visit: Payer: Self-pay | Admitting: Internal Medicine

## 2014-12-27 ENCOUNTER — Encounter: Payer: Self-pay | Admitting: *Deleted

## 2014-12-29 ENCOUNTER — Encounter: Payer: Self-pay | Admitting: Internal Medicine

## 2014-12-29 NOTE — Telephone Encounter (Signed)
Since I am on vacation, can you call her and let her know that I am not in the office and see if you can see what she needs.   She provided numbers and times for call.   Thanks

## 2015-02-06 ENCOUNTER — Other Ambulatory Visit: Payer: Self-pay | Admitting: Internal Medicine

## 2015-02-14 ENCOUNTER — Ambulatory Visit: Payer: Self-pay | Admitting: Physician Assistant

## 2015-02-22 ENCOUNTER — Ambulatory Visit: Payer: Self-pay | Admitting: Physician Assistant

## 2015-02-22 ENCOUNTER — Encounter: Payer: Self-pay | Admitting: Physician Assistant

## 2015-02-22 VITALS — BP 119/70 | HR 75 | Temp 97.4°F

## 2015-02-22 DIAGNOSIS — N390 Urinary tract infection, site not specified: Secondary | ICD-10-CM

## 2015-02-22 DIAGNOSIS — J018 Other acute sinusitis: Secondary | ICD-10-CM

## 2015-02-22 LAB — POCT URINALYSIS DIPSTICK
BILIRUBIN UA: NEGATIVE
Glucose, UA: NEGATIVE
Ketones, UA: NEGATIVE
LEUKOCYTES UA: NEGATIVE
NITRITE UA: NEGATIVE
PH UA: 5.5
PROTEIN UA: NEGATIVE
RBC UA: NEGATIVE
Spec Grav, UA: 1.02
UROBILINOGEN UA: 0.2

## 2015-02-22 MED ORDER — AMOXICILLIN 875 MG PO TABS: 875.0000 mg | ORAL_TABLET | Freq: Two times a day (BID) | ORAL | Status: DC

## 2015-02-22 NOTE — Progress Notes (Signed)
S: C/o runny nose and congestion for 7-10 days, sinus pressure, tired and fatigued,  no fever, chills, cp/sob, v/d; mucus is green and thick, cough is sporadic, also ?uti, urine has strong odor  Using otc meds:   O: PE: vitals wnl, nad, perrl eomi, normocephalic, tms dull, nasal mucosa red and swollen, throat injected, neck supple no lymph, lungs c t a, cv rrr, neuro intact, ua wnl  A:  Acute sinusitis   P: amoxil 875mg  bid x 10d, drink fluids, continue regular meds , use otc meds of choice, return if not improving in 5 days, return earlier if worsening

## 2015-03-02 NOTE — Progress Notes (Signed)
[name], female   DOB: Aug 10, 1950, 65 y.o.   MRN: [id number]  Patient called and expressed that she has two more days of antibiotics left and doesn't feel she is getting any better.  I informed Manuela Schwartz and she instructed the patient to stop the amoxil and begin the zpack.  The medication was sent to [facility] on [address].

## 2015-04-02 ENCOUNTER — Other Ambulatory Visit: Payer: Self-pay | Admitting: Internal Medicine

## 2015-05-11 ENCOUNTER — Encounter: Payer: Self-pay | Admitting: Physician Assistant

## 2015-05-11 ENCOUNTER — Ambulatory Visit: Payer: Self-pay | Admitting: Physician Assistant

## 2015-05-11 VITALS — BP 120/70 | HR 69 | Temp 97.9°F

## 2015-05-11 DIAGNOSIS — R609 Edema, unspecified: Secondary | ICD-10-CM

## 2015-05-11 MED ORDER — HYDROCHLOROTHIAZIDE 25 MG PO TABS: 25.0000 mg | ORAL_TABLET | Freq: Every day | ORAL | Status: DC

## 2015-05-11 NOTE — Progress Notes (Signed)
S: feet and lower legs have been swelling for the last week, states she hasn't changed any medications or her diet, noticed that her shoes are really tight, swelling is better after lying down, gets worse by the afternoon, denies cp/sob although she does get sob while working out, nonsmoker, see med list/problems list  O: vitals wnl, nad, lungs c t a, cv rrr, hands and face do not appear to be swollen, lower legs and feet appear swollen but edema is not pitting, full rom, n/v intact  A: dependent edema  P: met c, urine microalbumin, hctz 56m qd, f/u with pcp for additional eval

## 2015-05-12 LAB — COMPREHENSIVE METABOLIC PANEL
A/G RATIO: 1.7 (ref 1.2–2.2)
ALK PHOS: 47 IU/L (ref 39–117)
ALT: 36 IU/L — AB (ref 0–32)
AST: 29 IU/L (ref 0–40)
Albumin: 4.5 g/dL (ref 3.6–4.8)
BILIRUBIN TOTAL: 0.4 mg/dL (ref 0.0–1.2)
BUN/Creatinine Ratio: 23 (ref 12–28)
BUN: 18 mg/dL (ref 8–27)
CALCIUM: 9.7 mg/dL (ref 8.7–10.3)
CHLORIDE: 100 mmol/L (ref 96–106)
CO2: 25 mmol/L (ref 18–29)
Creatinine, Ser: 0.8 mg/dL (ref 0.57–1.00)
GFR calc Af Amer: 89 mL/min/{1.73_m2} (ref >59)
GFR calc non Af Amer: 78 mL/min/{1.73_m2} (ref >59)
Globulin, Total: 2.6 g/dL (ref 1.5–4.5)
Glucose: 95 mg/dL (ref 65–99)
POTASSIUM: 4 mmol/L (ref 3.5–5.2)
SODIUM: 140 mmol/L (ref 134–144)
Total Protein: 7.1 g/dL (ref 6.0–8.5)

## 2015-05-12 LAB — MICROALBUMIN / CREATININE URINE RATIO: CREATININE, UR: 55.7 mg/dL

## 2015-05-21 ENCOUNTER — Other Ambulatory Visit: Payer: Self-pay | Admitting: Internal Medicine

## 2015-05-22 ENCOUNTER — Encounter: Payer: Self-pay | Admitting: Emergency Medicine

## 2015-05-23 NOTE — Telephone Encounter (Signed)
Left message for the patient to return my call regarding updating her insurance information.

## 2015-05-29 LAB — HM PAP SMEAR: HM PAP: NEGATIVE

## 2015-06-29 ENCOUNTER — Other Ambulatory Visit: Payer: Self-pay | Admitting: *Deleted

## 2015-06-29 MED ORDER — OMEPRAZOLE 20 MG PO CPDR: 20.0000 mg | DELAYED_RELEASE_CAPSULE | Freq: Every day | ORAL | Status: DC

## 2015-06-29 NOTE — Telephone Encounter (Signed)
I spoke with pt after receiving a PA for Omeprazole for BID dosing. Per note, BID was only temporary & then pt was to resume QD dosing. Pt states that she only take it QD. Resent refill with correct instructions & disgarded PA request.

## 2015-07-23 ENCOUNTER — Other Ambulatory Visit: Payer: Self-pay | Admitting: Internal Medicine

## 2015-07-23 ENCOUNTER — Encounter: Payer: Self-pay | Admitting: Internal Medicine

## 2015-07-23 NOTE — Progress Notes (Signed)
Opened in error

## 2015-07-23 NOTE — Telephone Encounter (Signed)
I have typed up a letter with the labs and diagnosis codes.  Let me know if need anything more.

## 2015-07-27 ENCOUNTER — Other Ambulatory Visit: Payer: Self-pay

## 2015-07-27 ENCOUNTER — Other Ambulatory Visit: Payer: Self-pay | Admitting: Internal Medicine

## 2015-07-27 DIAGNOSIS — Z1231 Encounter for screening mammogram for malignant neoplasm of breast: Secondary | ICD-10-CM

## 2015-07-27 DIAGNOSIS — Z299 Encounter for prophylactic measures, unspecified: Secondary | ICD-10-CM

## 2015-07-27 NOTE — Progress Notes (Signed)
Patient came in to have blood drawn for testing per Dr. Charlene Scott's orders. 

## 2015-07-28 LAB — CMP12+LP+TP+TSH+6AC+CBC/D/PLT
ALK PHOS: 42 IU/L (ref 39–117)
ALT: 33 IU/L — AB (ref 0–32)
AST: 24 IU/L (ref 0–40)
Albumin/Globulin Ratio: 1.6 (ref 1.2–2.2)
Albumin: 4.1 g/dL (ref 3.6–4.8)
BASOS: 1 %
BUN/Creatinine Ratio: 30 — ABNORMAL HIGH (ref 12–28)
BUN: 21 mg/dL (ref 8–27)
Basophils Absolute: 0 10*3/uL (ref 0.0–0.2)
Bilirubin Total: 0.3 mg/dL (ref 0.0–1.2)
CALCIUM: 9.2 mg/dL (ref 8.7–10.3)
CHLORIDE: 101 mmol/L (ref 96–106)
CHOL/HDL RATIO: 7 ratio — AB (ref 0.0–4.4)
CREATININE: 0.69 mg/dL (ref 0.57–1.00)
Cholesterol, Total: 252 mg/dL — ABNORMAL HIGH (ref 100–199)
EOS (ABSOLUTE): 0.3 10*3/uL (ref 0.0–0.4)
ESTIMATED CHD RISK: 2 times avg. — AB (ref 0.0–1.0)
Eos: 4 %
Free Thyroxine Index: 1.4 (ref 1.2–4.9)
GFR, EST AFRICAN AMERICAN: 106 mL/min/{1.73_m2} (ref >59)
GFR, EST NON AFRICAN AMERICAN: 92 mL/min/{1.73_m2} (ref >59)
GGT: 55 IU/L (ref 0–60)
GLOBULIN, TOTAL: 2.5 g/dL (ref 1.5–4.5)
Glucose: 105 mg/dL — ABNORMAL HIGH (ref 65–99)
HDL: 36 mg/dL — ABNORMAL LOW (ref >39)
HEMATOCRIT: 40.6 % (ref 34.0–46.6)
Hemoglobin: 13.4 g/dL (ref 11.1–15.9)
IMMATURE GRANS (ABS): 0 10*3/uL (ref 0.0–0.1)
Immature Granulocytes: 0 %
Iron: 82 ug/dL (ref 27–139)
LDH: 135 IU/L (ref 119–226)
LDL CALC: 170 mg/dL — AB (ref 0–99)
LYMPHS: 29 %
Lymphocytes Absolute: 2.3 10*3/uL (ref 0.7–3.1)
MCH: 30.2 pg (ref 26.6–33.0)
MCHC: 33 g/dL (ref 31.5–35.7)
MCV: 91 fL (ref 79–97)
MONOS ABS: 0.5 10*3/uL (ref 0.1–0.9)
Monocytes: 6 %
NEUTROS ABS: 4.6 10*3/uL (ref 1.4–7.0)
Neutrophils: 60 %
PHOSPHORUS: 4 mg/dL (ref 2.5–4.5)
POTASSIUM: 4.4 mmol/L (ref 3.5–5.2)
Platelets: 243 10*3/uL (ref 150–379)
RBC: 4.44 x10E6/uL (ref 3.77–5.28)
RDW: 13.3 % (ref 12.3–15.4)
SODIUM: 141 mmol/L (ref 134–144)
T3 Uptake Ratio: 23 % — ABNORMAL LOW (ref 24–39)
T4 TOTAL: 6.1 ug/dL (ref 4.5–12.0)
TSH: 2.24 u[IU]/mL (ref 0.450–4.500)
Total Protein: 6.6 g/dL (ref 6.0–8.5)
Triglycerides: 232 mg/dL — ABNORMAL HIGH (ref 0–149)
URIC ACID: 4.7 mg/dL (ref 2.5–7.1)
VLDL Cholesterol Cal: 46 mg/dL — ABNORMAL HIGH (ref 5–40)
WBC: 7.7 10*3/uL (ref 3.4–10.8)

## 2015-07-28 LAB — HGB A1C W/O EAG: Hgb A1c MFr Bld: 5.8 % — ABNORMAL HIGH (ref 4.8–5.6)

## 2015-07-31 ENCOUNTER — Encounter: Payer: Self-pay | Admitting: Internal Medicine

## 2015-07-31 ENCOUNTER — Ambulatory Visit (INDEPENDENT_AMBULATORY_CARE_PROVIDER_SITE_OTHER): Payer: Managed Care, Other (non HMO) | Admitting: Internal Medicine

## 2015-07-31 DIAGNOSIS — R739 Hyperglycemia, unspecified: Secondary | ICD-10-CM

## 2015-07-31 DIAGNOSIS — D869 Sarcoidosis, unspecified: Secondary | ICD-10-CM

## 2015-07-31 DIAGNOSIS — Z658 Other specified problems related to psychosocial circumstances: Secondary | ICD-10-CM

## 2015-07-31 DIAGNOSIS — E663 Overweight: Secondary | ICD-10-CM

## 2015-07-31 DIAGNOSIS — F439 Reaction to severe stress, unspecified: Secondary | ICD-10-CM

## 2015-07-31 DIAGNOSIS — E7800 Pure hypercholesterolemia, unspecified: Secondary | ICD-10-CM

## 2015-07-31 DIAGNOSIS — K219 Gastro-esophageal reflux disease without esophagitis: Secondary | ICD-10-CM

## 2015-07-31 DIAGNOSIS — R7989 Other specified abnormal findings of blood chemistry: Secondary | ICD-10-CM | POA: Diagnosis not present

## 2015-07-31 DIAGNOSIS — R945 Abnormal results of liver function studies: Secondary | ICD-10-CM

## 2015-07-31 MED ORDER — ROSUVASTATIN CALCIUM 5 MG PO TABS: 5.0000 mg | ORAL_TABLET | Freq: Every day | ORAL | 1 refills | Status: DC
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
## 2015-08-01 ENCOUNTER — Encounter: Payer: Self-pay | Admitting: Internal Medicine

## 2015-08-01 DIAGNOSIS — R739 Hyperglycemia, unspecified: Secondary | ICD-10-CM | POA: Insufficient documentation

## 2015-08-01 DIAGNOSIS — Z6832 Body mass index (BMI) 32.0-32.9, adult: Secondary | ICD-10-CM | POA: Insufficient documentation

## 2015-08-01 DIAGNOSIS — Z6831 Body mass index (BMI) 31.0-31.9, adult: Secondary | ICD-10-CM

## 2015-08-01 MED ORDER — SERTRALINE HCL 50 MG PO TABS: ORAL_TABLET | ORAL | 2 refills | Status: DC

## 2015-08-01 NOTE — Assessment & Plan Note (Signed)
Was seeing Dr Nicolasa Ducking.  On zoloft.  Discussed with her today.  Occasionally feels overwhelmed.  Increased stress.  Will increase zoloft to 50mg  1 1/2 tablet q day.  Follow.

## 2015-08-01 NOTE — Assessment & Plan Note (Addendum)
EGD 09/2012.  On omeprazole and prn zantac.  Follow.  Symptoms controlled.

## 2015-08-01 NOTE — Assessment & Plan Note (Signed)
Low carb diet and exercise.  Follow met b and a1c.  Refer to nutritionist.  

## 2015-08-01 NOTE — Assessment & Plan Note (Signed)
Reviewed recent cholesterol labs.  Elevated.  LDL 170.  Start crestor as directed.  Follow.

## 2015-08-01 NOTE — Assessment & Plan Note (Signed)
Followed by Dr Fleming.   

## 2015-08-01 NOTE — Assessment & Plan Note (Signed)
Discussed weight loss.  Discussed diet and exercise.  Refer to nutritionist.

## 2015-08-01 NOTE — Assessment & Plan Note (Signed)
One liver test just slightly elevated.  Remainder wnl.  Follow.

## 2015-08-03 ENCOUNTER — Telehealth: Payer: Self-pay | Admitting: Internal Medicine

## 2015-08-03 MED ORDER — ROSUVASTATIN CALCIUM 5 MG PO TABS: 5.0000 mg | ORAL_TABLET | Freq: Every day | ORAL | 1 refills | Status: DC

## 2015-08-03 MED ORDER — SERTRALINE HCL 50 MG PO TABS: ORAL_TABLET | ORAL | 2 refills | Status: DC

## 2015-08-03 NOTE — Telephone Encounter (Signed)
Resent to [facility], notified patient.

## 2015-08-03 NOTE — Telephone Encounter (Signed)
Pt called about her Rx not at the pharmacy. Pt states would like it to go to [facility].   rosuvastatin (CRESTOR) 5 MG tablet and sertraline (ZOLOFT) 50 MG tablet.  Pharmacy is [place], [place] - [address]  Call pt @ [phone] or cell [phone]. Thank you!

## 2015-08-10 ENCOUNTER — Ambulatory Visit
Admission: RE | Admit: 2015-08-10 | Discharge: 2015-08-10 | Disposition: A | Discharge status: Home / Self Care | Payer: Managed Care, Other (non HMO) | Source: Ambulatory Visit | Attending: Internal Medicine | Admitting: Internal Medicine

## 2015-08-10 ENCOUNTER — Other Ambulatory Visit: Payer: Self-pay | Admitting: Internal Medicine

## 2015-08-10 DIAGNOSIS — Z1231 Encounter for screening mammogram for malignant neoplasm of breast: Secondary | ICD-10-CM | POA: Insufficient documentation

## 2015-09-11 ENCOUNTER — Encounter: Payer: Self-pay | Admitting: Internal Medicine

## 2015-09-11 ENCOUNTER — Telehealth: Payer: Self-pay | Admitting: *Deleted

## 2015-09-11 NOTE — Telephone Encounter (Signed)
Was seen on 7/25 and requested to see a nutritionist, please advise for diagnosis code, thanks

## 2015-09-11 NOTE — Telephone Encounter (Signed)
Pt was advised to see a nutritionist, however pt will need a diagnosis code to make sure her insurance will cover.   Pt contact [phone]

## 2015-09-12 NOTE — Telephone Encounter (Signed)
The order has already been placed.   Please refer to referral section of chart.  Hyperglycemia is the diagnosis code used.  If questions about referral, please touch base with Melissa to see what else needs to be done.

## 2015-09-13 NOTE — Telephone Encounter (Signed)
Attempted to reach patient, left VM.  

## 2015-09-13 NOTE — Telephone Encounter (Signed)
Left message to return call with husband in the morning since patient is currently at work. He said he would comply on telling her to call.

## 2015-09-17 ENCOUNTER — Encounter: Payer: Self-pay | Admitting: Physician Assistant

## 2015-09-17 ENCOUNTER — Ambulatory Visit: Payer: Self-pay | Admitting: Physician Assistant

## 2015-09-17 VITALS — BP 133/77 | HR 80 | Temp 98.4°F

## 2015-09-17 DIAGNOSIS — M545 Low back pain, unspecified: Secondary | ICD-10-CM

## 2015-09-17 MED ORDER — MELOXICAM 15 MG PO TABS: 15.0000 mg | ORAL_TABLET | Freq: Every day | ORAL | 0 refills | Status: DC

## 2015-09-17 MED ORDER — BACLOFEN 10 MG PO TABS: 10.0000 mg | ORAL_TABLET | Freq: Three times a day (TID) | ORAL | 0 refills | Status: DC

## 2015-09-17 NOTE — Progress Notes (Signed)
S:  C/o low back pain for 2-3 days, worked in the yard this weekend, pain is worse with movement, increased with bending over, denies numbness, tingling, or changes in bowel/urinary habits,  Using otc meds without relief Remainder ros neg  O:  Vitals wnl, nad, lungs c t a, cv rrr, spine nontender, muscles in lower back spasmed , decreased rom with bending forward, rises slowly from sitting to standing,  Neg slr, pt walks without difficulty, no foot drop noted, n/v intact  A: acute back pain, muscle spasms  P: use wet heat followed by ice, stretches, return to clinic if not better in 3 t 5 days, return earlier if worsening, rx meds: mobic, baclofen, if not better in a few days can call in a steroid

## 2015-11-08 ENCOUNTER — Other Ambulatory Visit: Payer: Self-pay | Admitting: Internal Medicine

## 2015-11-09 ENCOUNTER — Encounter: Payer: Self-pay | Admitting: Physician Assistant

## 2015-11-09 ENCOUNTER — Ambulatory Visit: Payer: Self-pay | Admitting: Physician Assistant

## 2015-11-09 DIAGNOSIS — Z299 Encounter for prophylactic measures, unspecified: Secondary | ICD-10-CM

## 2015-11-09 NOTE — Progress Notes (Signed)
Patient came in to have blood drawn for testing per Dr. Charlene Scott's orders. 

## 2015-11-10 LAB — HEPATIC FUNCTION PANEL
ALT: 30 IU/L (ref 0–32)
AST: 24 IU/L (ref 0–40)
Albumin: 4.6 g/dL (ref 3.6–4.8)
Alkaline Phosphatase: 52 IU/L (ref 39–117)
BILIRUBIN TOTAL: 0.3 mg/dL (ref 0.0–1.2)
Bilirubin, Direct: 0.11 mg/dL (ref 0.00–0.40)
Total Protein: 7.4 g/dL (ref 6.0–8.5)

## 2015-11-12 ENCOUNTER — Encounter: Payer: Self-pay | Admitting: Internal Medicine

## 2015-11-12 ENCOUNTER — Ambulatory Visit (INDEPENDENT_AMBULATORY_CARE_PROVIDER_SITE_OTHER): Payer: Managed Care, Other (non HMO) | Admitting: Internal Medicine

## 2015-11-12 DIAGNOSIS — R739 Hyperglycemia, unspecified: Secondary | ICD-10-CM | POA: Diagnosis not present

## 2015-11-12 DIAGNOSIS — D869 Sarcoidosis, unspecified: Secondary | ICD-10-CM | POA: Diagnosis not present

## 2015-11-12 DIAGNOSIS — R945 Abnormal results of liver function studies: Secondary | ICD-10-CM

## 2015-11-12 DIAGNOSIS — K219 Gastro-esophageal reflux disease without esophagitis: Secondary | ICD-10-CM

## 2015-11-12 DIAGNOSIS — E7800 Pure hypercholesterolemia, unspecified: Secondary | ICD-10-CM | POA: Diagnosis not present

## 2015-11-12 DIAGNOSIS — F439 Reaction to severe stress, unspecified: Secondary | ICD-10-CM

## 2015-11-12 DIAGNOSIS — R7989 Other specified abnormal findings of blood chemistry: Secondary | ICD-10-CM
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
## 2015-11-12 NOTE — Progress Notes (Signed)
Pre visit review using our clinic review tool, if applicable. No additional management support is needed unless otherwise documented below in the visit note. 

## 2015-11-14 ENCOUNTER — Encounter: Payer: Managed Care, Other (non HMO) | Attending: Internal Medicine | Admitting: Dietician

## 2015-11-14 VITALS — Ht 63.0 in | Wt 178.9 lb

## 2015-11-14 DIAGNOSIS — Z713 Dietary counseling and surveillance: Secondary | ICD-10-CM | POA: Insufficient documentation

## 2015-11-14 DIAGNOSIS — R739 Hyperglycemia, unspecified: Secondary | ICD-10-CM

## 2015-11-14 DIAGNOSIS — E782 Mixed hyperlipidemia: Secondary | ICD-10-CM

## 2015-11-14 DIAGNOSIS — E663 Overweight: Secondary | ICD-10-CM

## 2015-11-14 DIAGNOSIS — E785 Hyperlipidemia, unspecified: Secondary | ICD-10-CM | POA: Insufficient documentation

## 2015-11-14 NOTE — Patient Instructions (Signed)
   Control portions of starchy foods and sweet tea. Choose healthy carbs as often as possible, such as beans, peas, or whole grain foods.   Include generous portions of "free" vegetables with your meals.  Choose lean meats, mostly chicken or [place] and some lean red meats.   Keep searching for an enjoyable activity that will fit into your schedule, maybe a few online yoga stretches in the evening.

## 2015-11-14 NOTE — Progress Notes (Signed)
Medical Nutrition Therapy: Visit start time: 0900  end time: 1000  Assessment:  Diagnosis: hyperlipidemia, hyperglycemia Past medical history: GERD Psychosocial issues/ stress concerns: none; patient reports moderate stress level Preferred learning method:  Nicki [place] . Hands-on  Current weight: 178.9lbs  Height: 5'3" Medications, supplements: reviewed list in chart with patient  Progress and evaluation: Patient reports high triglycerides for some time, and some increase in blood sugar.  She reports having a busy schedule, several food dislikes, has never planned ahead for meals or snacks. She reports frequent dining out, and quick, simple meals at home. Fitting in exercise is also difficult due to busy schedule and difficulty finding an exercise partner -- husband is resistant to incorporating exercise.  Physical activity: some treadmill walking, about 30 minutes 3-4 times a week (patient bored with treadmill)  Dietary Intake:  Usual eating pattern includes 3 meals and ? snacks per day. Dining out frequency: 5-7 meals per week.  Patient reports she has not been able to make healthy diet changes; she did attend diabetes classes with her husband some time ago, but felt the meal planning process was complicated and difficult to implement. Did not discuss specific diet history as patient wished to change direction of discussion.   She did state that she does not like any fruits, but does like vegetables. She loves sweet tea and is resistant to changing, states she would not be able to decrease to 1/2-sweet tea either. Does not like plain water.   Nutrition Care Education: Topics covered: lipid and blood sugar control Basic nutrition: basic food groups, appropriate nutrient balance, appropriate meal and snack schedule Weight control: benefits of weight control Advanced nutrition:  dining out Blood sugar: appropriate carb intake and balance, healthy carb choices, importance of limiting sugar  intake, including lean protein sources with meals to promote fulness and minimize blood sugar fluctuations.  Hyperlipidemia:  role of sugar, fiber;  food sources of folate, fiber, phytochemicals; role of exercise in controlling blood lipids and blood sugar as well as weight.  Other lifestyle changes:  benefits of making changes, increasing motivation, readiness for change  Nutritional Diagnosis:  Cumings-2.2 Altered nutrition-related laboratory As related to hyperlipidemia, hyperglycemia.  As evidenced by lab report, patient report.  Intervention: Instruction as noted above.   Worked with patient to establish readiness for change, encouraged her to consider personal goals and develop strategies to work towards goals. Discussed making changes gradually in small steps.    No follow-up scheduled at this time; patient to call and schedule later if needed.   Education Materials given:  . Plate Planner with food lists . Dining out resource . Sample meal pattern/ menus: Quick and Healthy Meal Ideas . Goals/ instructions  Learner/ who was taught:  . Patient   Level of understanding: Marland Kitchen Verbalizes/ demonstrates competency  Demonstrated degree of understanding via:   Teach back Learning barriers: . None  Willingness to learn/ readiness for change: . Hesitance, contemplating change   Monitoring and Evaluation:  Dietary intake, exercise, blood lipid and sugar control, and body weight      follow up: prn

## 2015-11-16 ENCOUNTER — Other Ambulatory Visit: Payer: Self-pay | Admitting: Internal Medicine

## 2015-11-25 ENCOUNTER — Encounter: Payer: Self-pay | Admitting: Internal Medicine

## 2015-11-25 NOTE — Assessment & Plan Note (Signed)
Controlled on current regimen.  Follow.  

## 2015-11-25 NOTE — Assessment & Plan Note (Signed)
Recent liver panel wnl.  Follow.  

## 2015-11-25 NOTE — Assessment & Plan Note (Signed)
Followed by Dr Fleming.  Stable.  

## 2015-11-25 NOTE — Assessment & Plan Note (Signed)
Low carb diet and exercise.  Follow met b and a1c.  

## 2015-11-25 NOTE — Assessment & Plan Note (Signed)
Discussed with her today.  Previously saw Dr Nicolasa Ducking.  Reports not taking zoloft now - per report.  Desires no further intervention.  Follow.

## 2015-11-25 NOTE — Assessment & Plan Note (Signed)
Low cholesterol diet and exercise.  Follow lipid panel and liver function tests.  On crestor.   

## 2015-11-28 ENCOUNTER — Ambulatory Visit: Payer: Self-pay | Admitting: Dietician

## 2015-12-21 ENCOUNTER — Encounter: Payer: Self-pay | Admitting: Internal Medicine

## 2015-12-21 ENCOUNTER — Other Ambulatory Visit: Payer: Self-pay | Admitting: Internal Medicine

## 2015-12-21 MED ORDER — OMEPRAZOLE 20 MG PO CPDR: 20.0000 mg | DELAYED_RELEASE_CAPSULE | Freq: Every day | ORAL | 1 refills | Status: DC

## 2015-12-24 MED ORDER — ZOLMITRIPTAN 5 MG PO TABS: ORAL_TABLET | ORAL | 0 refills | Status: DC

## 2015-12-24 NOTE — Telephone Encounter (Signed)
rx ok'd for zomig #10 with no refills.

## 2016-01-16 ENCOUNTER — Other Ambulatory Visit: Payer: Self-pay | Admitting: Internal Medicine

## 2016-02-29 ENCOUNTER — Other Ambulatory Visit: Payer: Self-pay

## 2016-02-29 DIAGNOSIS — Z299 Encounter for prophylactic measures, unspecified: Secondary | ICD-10-CM

## 2016-02-29 NOTE — Progress Notes (Signed)
Patient came in to have blood drawn for testing per Dr. Randell Patient Scott's orders.  She also had [place] to fill out the Biometric Form.  Patient expressed that she has an upcoming physical appointment with her own physician.

## 2016-03-01 LAB — CMP12+LP+TP+TSH+6AC+CBC/D/PLT
A/G RATIO: 1.7 (ref 1.2–2.2)
ALBUMIN: 4.3 g/dL (ref 3.6–4.8)
ALT: 32 IU/L (ref 0–32)
AST: 26 IU/L (ref 0–40)
Alkaline Phosphatase: 50 IU/L (ref 39–117)
BILIRUBIN TOTAL: 0.3 mg/dL (ref 0.0–1.2)
BUN/Creatinine Ratio: 31 — ABNORMAL HIGH (ref 12–28)
BUN: 24 mg/dL (ref 8–27)
Basophils Absolute: 0 10*3/uL (ref 0.0–0.2)
Basos: 1 %
CHLORIDE: 102 mmol/L (ref 96–106)
Calcium: 9.2 mg/dL (ref 8.7–10.3)
Chol/HDL Ratio: 4.9 ratio units — ABNORMAL HIGH (ref 0.0–4.4)
Cholesterol, Total: 209 mg/dL — ABNORMAL HIGH (ref 100–199)
Creatinine, Ser: 0.77 mg/dL (ref 0.57–1.00)
EOS (ABSOLUTE): 0.1 10*3/uL (ref 0.0–0.4)
EOS: 1 %
ESTIMATED CHD RISK: 1.3 times avg. — AB (ref 0.0–1.0)
FREE THYROXINE INDEX: 1.7 (ref 1.2–4.9)
GFR calc Af Amer: 94 mL/min/{1.73_m2} (ref >59)
GFR calc non Af Amer: 81 mL/min/{1.73_m2} (ref >59)
GGT: 45 IU/L (ref 0–60)
GLOBULIN, TOTAL: 2.6 g/dL (ref 1.5–4.5)
Glucose: 102 mg/dL — ABNORMAL HIGH (ref 65–99)
HDL: 43 mg/dL (ref >39)
HEMOGLOBIN: 13.3 g/dL (ref 11.1–15.9)
Hematocrit: 39.5 % (ref 34.0–46.6)
IMMATURE GRANULOCYTES: 0 %
IRON: 104 ug/dL (ref 27–139)
Immature Grans (Abs): 0 10*3/uL (ref 0.0–0.1)
LDH: 148 IU/L (ref 119–226)
LDL Calculated: 132 mg/dL — ABNORMAL HIGH (ref 0–99)
LYMPHS ABS: 2.3 10*3/uL (ref 0.7–3.1)
Lymphs: 37 %
MCH: 30.6 pg (ref 26.6–33.0)
MCHC: 33.7 g/dL (ref 31.5–35.7)
MCV: 91 fL (ref 79–97)
MONOS ABS: 0.4 10*3/uL (ref 0.1–0.9)
Monocytes: 7 %
NEUTROS PCT: 54 %
Neutrophils Absolute: 3.3 10*3/uL (ref 1.4–7.0)
PLATELETS: 239 10*3/uL (ref 150–379)
Phosphorus: 3.1 mg/dL (ref 2.5–4.5)
Potassium: 4.3 mmol/L (ref 3.5–5.2)
RBC: 4.35 x10E6/uL (ref 3.77–5.28)
RDW: 13.4 % (ref 12.3–15.4)
Sodium: 140 mmol/L (ref 134–144)
T3 UPTAKE RATIO: 23 % — AB (ref 24–39)
T4, Total: 7.6 ug/dL (ref 4.5–12.0)
TOTAL PROTEIN: 6.9 g/dL (ref 6.0–8.5)
TRIGLYCERIDES: 171 mg/dL — AB (ref 0–149)
TSH: 2.95 u[IU]/mL (ref 0.450–4.500)
Uric Acid: 4.2 mg/dL (ref 2.5–7.1)
VLDL CHOLESTEROL CAL: 34 mg/dL (ref 5–40)
WBC: 6 10*3/uL (ref 3.4–10.8)

## 2016-03-01 LAB — HGB A1C W/O EAG: Hgb A1c MFr Bld: 5.4 % (ref 4.8–5.6)

## 2016-03-04 ENCOUNTER — Encounter: Payer: Self-pay | Admitting: Internal Medicine

## 2016-03-13 ENCOUNTER — Ambulatory Visit (INDEPENDENT_AMBULATORY_CARE_PROVIDER_SITE_OTHER): Payer: Managed Care, Other (non HMO) | Admitting: Internal Medicine

## 2016-03-13 ENCOUNTER — Encounter: Payer: Self-pay | Admitting: Internal Medicine

## 2016-03-13 VITALS — BP 120/78 | HR 69 | Temp 98.6°F | Ht 63.0 in | Wt 178.4 lb

## 2016-03-13 DIAGNOSIS — R7989 Other specified abnormal findings of blood chemistry: Secondary | ICD-10-CM | POA: Diagnosis not present

## 2016-03-13 DIAGNOSIS — R739 Hyperglycemia, unspecified: Secondary | ICD-10-CM

## 2016-03-13 DIAGNOSIS — F439 Reaction to severe stress, unspecified: Secondary | ICD-10-CM

## 2016-03-13 DIAGNOSIS — D869 Sarcoidosis, unspecified: Secondary | ICD-10-CM

## 2016-03-13 DIAGNOSIS — E7800 Pure hypercholesterolemia, unspecified: Secondary | ICD-10-CM

## 2016-03-13 DIAGNOSIS — J329 Chronic sinusitis, unspecified: Secondary | ICD-10-CM

## 2016-03-13 DIAGNOSIS — K219 Gastro-esophageal reflux disease without esophagitis: Secondary | ICD-10-CM | POA: Diagnosis not present

## 2016-03-13 DIAGNOSIS — Z0000 Encounter for general adult medical examination without abnormal findings: Secondary | ICD-10-CM

## 2016-03-13 DIAGNOSIS — R945 Abnormal results of liver function studies: Secondary | ICD-10-CM

## 2016-03-13 MED ORDER — AMOXICILLIN 875 MG PO TABS: 875.0000 mg | ORAL_TABLET | Freq: Two times a day (BID) | ORAL | 0 refills | Status: DC

## 2016-03-13 NOTE — Progress Notes (Signed)
Pre-visit discussion using our clinic review tool. No additional management support is needed unless otherwise documented below in the visit note.  
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
## 2016-03-13 NOTE — Patient Instructions (Signed)
Saline nasal spray - flush nose at least 2-3x/day  flonase nasal spray - 2 sprays each nostril one time per day.  Do this in the evening.    Take a probiotic while you are on the antibiotic and for two weeks after completing the antibiotic.   

## 2016-03-16 ENCOUNTER — Encounter: Payer: Self-pay | Admitting: Internal Medicine

## 2016-03-16 NOTE — Assessment & Plan Note (Signed)
On crestor.  Low cholesterol diet and exercise.  Follow lipid panel and liver function tests.   

## 2016-03-16 NOTE — Assessment & Plan Note (Addendum)
Physical today 03/13/16.  Request referral to gyn for pelvic and pap smears.  Dr Laurey Morale retired.  Mammogram 08/10/15 - Birads I.  Colonoscopy 12/18/14 - recommended f/u in 5 years.

## 2016-03-16 NOTE — Assessment & Plan Note (Signed)
Symptoms controlled on omeprazole.   

## 2016-03-16 NOTE — Assessment & Plan Note (Signed)
Low carb diet and exercise.  Follow met b and a1c.   

## 2016-03-16 NOTE — Assessment & Plan Note (Signed)
Follow liver panel.  

## 2016-03-16 NOTE — Assessment & Plan Note (Signed)
Followed by Dr Raul Del.  Treat current infection.  Follow.

## 2016-03-16 NOTE — Assessment & Plan Note (Signed)
She feels she is handling things relatively well.  Does not feel needs any further intervention.  Follow.

## 2016-05-06 ENCOUNTER — Ambulatory Visit: Payer: Self-pay | Admitting: Physician Assistant

## 2016-05-06 ENCOUNTER — Encounter: Payer: Self-pay | Admitting: Physician Assistant

## 2016-05-06 VITALS — BP 120/75 | HR 80 | Temp 97.6°F

## 2016-05-06 DIAGNOSIS — J301 Allergic rhinitis due to pollen: Secondary | ICD-10-CM

## 2016-05-06 MED ORDER — PREDNISONE 10 MG PO TABS: 30.0000 mg | ORAL_TABLET | Freq: Every day | ORAL | 0 refills | Status: DC

## 2016-05-06 MED ORDER — MONTELUKAST SODIUM 10 MG PO TABS: 10.0000 mg | ORAL_TABLET | Freq: Every day | ORAL | 3 refills | Status: DC

## 2016-05-06 NOTE — Progress Notes (Signed)
S: c/o runny nose, congestion, watery eyes, some sinus pressure, sx for about a week, dry cough from pnd, denies fever/chills/body aches,cp/sob, or v/d; states she used flonase and allegra without relief, feels really tired, happens every april  O: vitals wnl, nad, perrl eomi, conjunctiva wnl, tms dull, nasal mucosa swollen and boggy, throat wnl, neck supple no lymph, lungs c t a, cv rrr  A: acute seasonal allergies  P: saline nasal rinse, flonase, singulair 10 mg qd, pred 30mg  qd x 3d

## 2016-05-27 DIAGNOSIS — N393 Stress incontinence (female) (male): Secondary | ICD-10-CM | POA: Insufficient documentation

## 2016-07-05 ENCOUNTER — Other Ambulatory Visit: Payer: Self-pay | Admitting: Internal Medicine

## 2016-07-18 ENCOUNTER — Other Ambulatory Visit: Payer: Self-pay

## 2016-07-18 DIAGNOSIS — E7800 Pure hypercholesterolemia, unspecified: Secondary | ICD-10-CM

## 2016-07-19 LAB — CMP12+LP+TP+TSH+6AC+CBC/D/PLT
A/G RATIO: 1.6 (ref 1.2–2.2)
ALBUMIN: 4.4 g/dL (ref 3.6–4.8)
ALK PHOS: 53 IU/L (ref 39–117)
ALT: 34 IU/L — ABNORMAL HIGH (ref 0–32)
AST: 33 IU/L (ref 0–40)
BASOS: 1 %
BILIRUBIN TOTAL: 0.6 mg/dL (ref 0.0–1.2)
BUN/Creatinine Ratio: 22 (ref 12–28)
BUN: 19 mg/dL (ref 8–27)
Basophils Absolute: 0 10*3/uL (ref 0.0–0.2)
CHOL/HDL RATIO: 4.7 ratio — AB (ref 0.0–4.4)
CHOLESTEROL TOTAL: 205 mg/dL — AB (ref 100–199)
Calcium: 9.1 mg/dL (ref 8.7–10.3)
Chloride: 101 mmol/L (ref 96–106)
Creatinine, Ser: 0.88 mg/dL (ref 0.57–1.00)
EOS (ABSOLUTE): 0.1 10*3/uL (ref 0.0–0.4)
Eos: 2 %
Estimated CHD Risk: 1.2 times avg. — ABNORMAL HIGH (ref 0.0–1.0)
FREE THYROXINE INDEX: 1.8 (ref 1.2–4.9)
GFR calc non Af Amer: 69 mL/min/{1.73_m2} (ref >59)
GFR, EST AFRICAN AMERICAN: 79 mL/min/{1.73_m2} (ref >59)
GGT: 36 IU/L (ref 0–60)
GLOBULIN, TOTAL: 2.7 g/dL (ref 1.5–4.5)
Glucose: 101 mg/dL — ABNORMAL HIGH (ref 65–99)
HDL: 44 mg/dL (ref >39)
HEMATOCRIT: 40.7 % (ref 34.0–46.6)
Hemoglobin: 13.7 g/dL (ref 11.1–15.9)
IMMATURE GRANS (ABS): 0 10*3/uL (ref 0.0–0.1)
IMMATURE GRANULOCYTES: 0 %
Iron: 120 ug/dL (ref 27–139)
LDH: 174 IU/L (ref 119–226)
LDL CALC: 131 mg/dL — AB (ref 0–99)
LYMPHS: 36 %
Lymphocytes Absolute: 2 10*3/uL (ref 0.7–3.1)
MCH: 30.4 pg (ref 26.6–33.0)
MCHC: 33.7 g/dL (ref 31.5–35.7)
MCV: 90 fL (ref 79–97)
MONOCYTES: 8 %
MONOS ABS: 0.4 10*3/uL (ref 0.1–0.9)
NEUTROS ABS: 2.9 10*3/uL (ref 1.4–7.0)
NEUTROS PCT: 53 %
Phosphorus: 3.6 mg/dL (ref 2.5–4.5)
Platelets: 239 10*3/uL (ref 150–379)
Potassium: 4.3 mmol/L (ref 3.5–5.2)
RBC: 4.51 x10E6/uL (ref 3.77–5.28)
RDW: 13 % (ref 12.3–15.4)
Sodium: 140 mmol/L (ref 134–144)
T3 Uptake Ratio: 23 % — ABNORMAL LOW (ref 24–39)
T4, Total: 7.7 ug/dL (ref 4.5–12.0)
TRIGLYCERIDES: 151 mg/dL — AB (ref 0–149)
TSH: 2.93 u[IU]/mL (ref 0.450–4.500)
Total Protein: 7.1 g/dL (ref 6.0–8.5)
Uric Acid: 4.1 mg/dL (ref 2.5–7.1)
VLDL Cholesterol Cal: 30 mg/dL (ref 5–40)
WBC: 5.4 10*3/uL (ref 3.4–10.8)

## 2016-07-23 ENCOUNTER — Encounter: Payer: Self-pay | Admitting: *Deleted

## 2016-07-25 NOTE — Telephone Encounter (Signed)
Mailed to patient

## 2016-07-26 ENCOUNTER — Other Ambulatory Visit: Payer: Self-pay | Admitting: Internal Medicine

## 2016-07-28 ENCOUNTER — Encounter: Payer: Self-pay | Admitting: Internal Medicine

## 2016-07-28 ENCOUNTER — Ambulatory Visit (INDEPENDENT_AMBULATORY_CARE_PROVIDER_SITE_OTHER): Payer: Managed Care, Other (non HMO) | Admitting: Internal Medicine

## 2016-07-28 VITALS — BP 122/78 | HR 69 | Temp 98.6°F | Resp 12 | Ht 63.0 in | Wt 171.8 lb

## 2016-07-28 DIAGNOSIS — R739 Hyperglycemia, unspecified: Secondary | ICD-10-CM | POA: Diagnosis not present

## 2016-07-28 DIAGNOSIS — R7989 Other specified abnormal findings of blood chemistry: Secondary | ICD-10-CM

## 2016-07-28 DIAGNOSIS — K219 Gastro-esophageal reflux disease without esophagitis: Secondary | ICD-10-CM

## 2016-07-28 DIAGNOSIS — R945 Abnormal results of liver function studies: Secondary | ICD-10-CM

## 2016-07-28 DIAGNOSIS — Z1231 Encounter for screening mammogram for malignant neoplasm of breast: Secondary | ICD-10-CM

## 2016-07-28 DIAGNOSIS — D869 Sarcoidosis, unspecified: Secondary | ICD-10-CM

## 2016-07-28 DIAGNOSIS — E7800 Pure hypercholesterolemia, unspecified: Secondary | ICD-10-CM

## 2016-07-28 DIAGNOSIS — Z1239 Encounter for other screening for malignant neoplasm of breast: Secondary | ICD-10-CM

## 2016-07-28 NOTE — Progress Notes (Signed)
Pre-visit discussion using our clinic review tool. No additional management support is needed unless otherwise documented below in the visit note.  
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
## 2016-07-30 ENCOUNTER — Encounter: Payer: Self-pay | Admitting: Internal Medicine

## 2016-07-30 NOTE — Assessment & Plan Note (Signed)
On crestor.  Low cholesterol diet and exercise.  Follow lipid panel and liver function tests.   

## 2016-07-30 NOTE — Assessment & Plan Note (Signed)
Slightly elevated.  Discussed diet and exercise.  Follow liver panel.

## 2016-07-30 NOTE — Assessment & Plan Note (Signed)
Low carb diet and exercise.  Follow met b and a1c.   

## 2016-07-30 NOTE — Assessment & Plan Note (Signed)
Followed by Dr Fleming.  Stable.  

## 2016-07-30 NOTE — Assessment & Plan Note (Signed)
On omeprazole.  Controlled.   

## 2016-08-15 ENCOUNTER — Ambulatory Visit
Admission: RE | Admit: 2016-08-15 | Discharge: 2016-08-15 | Disposition: A | Discharge status: Home / Self Care | Payer: Managed Care, Other (non HMO) | Source: Ambulatory Visit | Attending: Internal Medicine | Admitting: Internal Medicine

## 2016-08-15 DIAGNOSIS — Z1231 Encounter for screening mammogram for malignant neoplasm of breast: Secondary | ICD-10-CM | POA: Diagnosis present

## 2016-08-15 DIAGNOSIS — Z1239 Encounter for other screening for malignant neoplasm of breast: Secondary | ICD-10-CM

## 2016-08-23 ENCOUNTER — Other Ambulatory Visit: Payer: Self-pay | Admitting: Internal Medicine

## 2016-08-25 NOTE — Telephone Encounter (Signed)
This medication looks like it was D/C by PCP 11/17 patient pharmacy requesting refill, ok to fill?

## 2016-08-26 ENCOUNTER — Ambulatory Visit: Payer: Managed Care, Other (non HMO) | Attending: Obstetrics & Gynecology | Admitting: Physical Therapy

## 2016-08-26 ENCOUNTER — Encounter: Payer: Self-pay | Admitting: Physical Therapy

## 2016-08-26 DIAGNOSIS — M545 Low back pain: Secondary | ICD-10-CM | POA: Diagnosis present

## 2016-08-26 DIAGNOSIS — G8929 Other chronic pain: Secondary | ICD-10-CM | POA: Diagnosis present

## 2016-08-26 DIAGNOSIS — M6281 Muscle weakness (generalized): Secondary | ICD-10-CM

## 2016-08-26 DIAGNOSIS — M533 Sacrococcygeal disorders, not elsewhere classified: Secondary | ICD-10-CM | POA: Diagnosis present

## 2016-08-26 DIAGNOSIS — R278 Other lack of coordination: Secondary | ICD-10-CM | POA: Diagnosis not present
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
## 2016-08-26 NOTE — Patient Instructions (Addendum)
   Standing:  10 reps on both sides x 3 x day     3 point tap   Feet are hip width Tap forward, center under hip not feet next to each other  Tap middle\, center  Tap back     _Figure-4 and then toe touch to the ground behind you along a diagonal    Seated:  Figure 4 5 breaths    _____________   Christy Escobar 45 Degrees   Lying with hips and knees bent 45, one pillow between knees and ankles. Lift knee with exhale. Be sure pelvis does not roll backward. Do not arch back. Do 15 times x 2  On R Do 15 x 1 x L , 2 times per day.  http://ss.exer.us/75   Copyright  VHI. All rights reserved.    __________

## 2016-08-26 NOTE — Telephone Encounter (Signed)
Need to clarify with pt if she is taking.  It appears she has not been taking.

## 2016-09-04 ENCOUNTER — Encounter: Payer: Self-pay | Admitting: *Deleted

## 2016-09-04 NOTE — Telephone Encounter (Signed)
Mychart message sent to patient.

## 2016-09-05 NOTE — Telephone Encounter (Signed)
Patient returned my message and she is still taking Zoloft ok to fill?

## 2016-09-09 ENCOUNTER — Encounter: Payer: Self-pay | Admitting: Physical Therapy

## 2016-09-10 ENCOUNTER — Ambulatory Visit: Payer: Managed Care, Other (non HMO) | Attending: Obstetrics & Gynecology | Admitting: Physical Therapy

## 2016-09-10 DIAGNOSIS — M533 Sacrococcygeal disorders, not elsewhere classified: Secondary | ICD-10-CM | POA: Insufficient documentation

## 2016-09-10 DIAGNOSIS — M545 Low back pain: Secondary | ICD-10-CM | POA: Diagnosis present

## 2016-09-10 DIAGNOSIS — G8929 Other chronic pain: Secondary | ICD-10-CM

## 2016-09-10 DIAGNOSIS — R278 Other lack of coordination: Secondary | ICD-10-CM | POA: Diagnosis present

## 2016-09-10 DIAGNOSIS — M6281 Muscle weakness (generalized): Secondary | ICD-10-CM | POA: Diagnosis present

## 2016-09-10 NOTE — Patient Instructions (Signed)
laying on back:  Half angel with R hand  In bed   Or seated with elbow up   10 reps   At work  [employer] with R hand only , standing perpendicular to wall 10 reps   Forward stretch at table  And then mini squat, scoot knees forawrd and rise up to standing without locking knees  10 reps     Deep core ( handout)  Morning and night
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
## 2016-09-15 ENCOUNTER — Ambulatory Visit: Payer: Managed Care, Other (non HMO) | Admitting: Physical Therapy

## 2016-09-15 DIAGNOSIS — M545 Low back pain: Secondary | ICD-10-CM

## 2016-09-15 DIAGNOSIS — R278 Other lack of coordination: Secondary | ICD-10-CM | POA: Diagnosis not present

## 2016-09-15 DIAGNOSIS — M6281 Muscle weakness (generalized): Secondary | ICD-10-CM

## 2016-09-15 DIAGNOSIS — M533 Sacrococcygeal disorders, not elsewhere classified: Secondary | ICD-10-CM

## 2016-09-15 DIAGNOSIS — G8929 Other chronic pain: Secondary | ICD-10-CM

## 2016-09-15 NOTE — Patient Instructions (Addendum)
Keepup with the work stretches during the work hours   This week add a regimen for morning and night on your bed   -open book ( 15 reps each side)   - figure -4 stretch with towel under thigh   - deep core level 2 ( knee out)   ___  Sleep with a pillow between knees ( body pillow)  To keep hips and knees aligned
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
## 2016-09-22 ENCOUNTER — Ambulatory Visit: Payer: Managed Care, Other (non HMO) | Admitting: Physical Therapy

## 2016-09-23 ENCOUNTER — Encounter: Payer: Self-pay | Admitting: Internal Medicine

## 2016-09-23 ENCOUNTER — Other Ambulatory Visit: Payer: Self-pay | Admitting: Internal Medicine

## 2016-09-23 ENCOUNTER — Encounter: Payer: Self-pay | Admitting: Physical Therapy

## 2016-10-06 ENCOUNTER — Ambulatory Visit: Payer: Managed Care, Other (non HMO) | Attending: Obstetrics & Gynecology | Admitting: Physical Therapy

## 2016-10-06 DIAGNOSIS — M533 Sacrococcygeal disorders, not elsewhere classified: Secondary | ICD-10-CM | POA: Insufficient documentation

## 2016-10-06 DIAGNOSIS — R278 Other lack of coordination: Secondary | ICD-10-CM

## 2016-10-06 DIAGNOSIS — M6281 Muscle weakness (generalized): Secondary | ICD-10-CM | POA: Diagnosis present

## 2016-10-06 DIAGNOSIS — M545 Low back pain: Secondary | ICD-10-CM | POA: Insufficient documentation

## 2016-10-06 DIAGNOSIS — G8929 Other chronic pain: Secondary | ICD-10-CM | POA: Insufficient documentation

## 2016-10-07 ENCOUNTER — Encounter: Payer: Self-pay | Admitting: Physical Therapy
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
## 2016-10-10 ENCOUNTER — Other Ambulatory Visit: Payer: Self-pay | Admitting: Internal Medicine

## 2016-10-14 ENCOUNTER — Encounter: Payer: Self-pay | Admitting: Physical Therapy

## 2016-10-17 ENCOUNTER — Other Ambulatory Visit: Payer: Self-pay

## 2016-10-20 ENCOUNTER — Ambulatory Visit: Payer: Managed Care, Other (non HMO) | Admitting: Physical Therapy

## 2016-10-20 DIAGNOSIS — M545 Low back pain: Secondary | ICD-10-CM

## 2016-10-20 DIAGNOSIS — M6281 Muscle weakness (generalized): Secondary | ICD-10-CM

## 2016-10-20 DIAGNOSIS — R278 Other lack of coordination: Secondary | ICD-10-CM

## 2016-10-20 DIAGNOSIS — M533 Sacrococcygeal disorders, not elsewhere classified: Secondary | ICD-10-CM

## 2016-10-20 DIAGNOSIS — G8929 Other chronic pain: Secondary | ICD-10-CM

## 2016-10-20 NOTE — Patient Instructions (Addendum)
Yellow band:  Under one foot with knee unlocked: bicep curls  Other foot on toe tip for stability  10 x 2 each leg    ))))))))   Minisquat, band under both feet Knees behind toes  Inhale, Exhale pull band in "w" elbows by side     __________    Keep up with stretches after treadmill _figure 4  And _ cross over the thigh with towel under thigh _quad stretch  _ hip flexor and calf stretch     Practice deep core knee out ( inhale, expand pelvic floor)   Keep up with stretches at work     __________ To minimize constipation:  Increase water and fiber intake
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
## 2016-10-24 ENCOUNTER — Other Ambulatory Visit: Payer: Self-pay

## 2016-10-24 DIAGNOSIS — Z299 Encounter for prophylactic measures, unspecified: Secondary | ICD-10-CM

## 2016-10-24 NOTE — Progress Notes (Signed)
Patient came in to have blood drawn for testing per Dr. Randell Patient Scott's orders.

## 2016-10-25 LAB — HEPATIC FUNCTION PANEL
ALK PHOS: 57 IU/L (ref 39–117)
ALT: 25 IU/L (ref 0–32)
AST: 26 IU/L (ref 0–40)
Albumin: 4.4 g/dL (ref 3.6–4.8)
Bilirubin Total: 0.3 mg/dL (ref 0.0–1.2)
Bilirubin, Direct: 0.08 mg/dL (ref 0.00–0.40)
TOTAL PROTEIN: 7.1 g/dL (ref 6.0–8.5)

## 2016-10-25 LAB — HCV COMMENT:

## 2016-10-25 LAB — HEPATITIS C ANTIBODY (REFLEX)

## 2016-10-28 ENCOUNTER — Encounter: Payer: Self-pay | Admitting: Physical Therapy

## 2016-11-03 ENCOUNTER — Ambulatory Visit: Payer: Managed Care, Other (non HMO) | Admitting: Physical Therapy

## 2016-11-03 DIAGNOSIS — M545 Low back pain: Secondary | ICD-10-CM

## 2016-11-03 DIAGNOSIS — M533 Sacrococcygeal disorders, not elsewhere classified: Secondary | ICD-10-CM

## 2016-11-03 DIAGNOSIS — R278 Other lack of coordination: Secondary | ICD-10-CM

## 2016-11-03 DIAGNOSIS — M6281 Muscle weakness (generalized): Secondary | ICD-10-CM

## 2016-11-03 DIAGNOSIS — G8929 Other chronic pain: Secondary | ICD-10-CM

## 2016-11-03 NOTE — Patient Instructions (Signed)
PELVIC FLOOR / KEGEL EXERCISES   Pelvic floor/ Kegel exercises are used to strengthen the muscles in the base of your pelvis that are responsible for supporting your pelvic organs and preventing urine/feces leakage. Based on your therapist's recommendations, they can be performed while standing, sitting, or lying down. Imagine pelvic floor area as a diamond with pelvic landmarks: top =pubic bone, bottom tip=tailbone, sides=sitting bones (ischial tuberosities).    Make yourself aware of this muscle group by using these cues while coordinating your breath:  Inhale, feel pelvic floor diamond area lower like hammock towards your feet and ribcage/belly expanding. Pause. Let the exhale naturally and feel your belly sink, abdominal muscles hugging in around you and you may notice the pelvic diamond draws upward towards your head forming a umbrella shape. Give a squeeze during the exhalation like you are stopping the flow of urine. If you are squeezing the buttock muscles, try to give 50% less effort.   Common Errors:  Breath holding: If you are holding your breath, you may be bearing down against your bladder instead of pulling it up. If you belly bulges up while you are squeezing, you are holding your breath. Be sure to breathe gently in and out while exercising. Counting out loud may help you avoid holding your breath.  Accessory muscle use: You should not see or feel other muscle movement when performing pelvic floor exercises. When done properly, no one can tell that you are performing the exercises. Keep the buttocks, belly and inner thighs relaxed.  Overdoing it: Your muscles can fatigue and stop working for you if you over-exercise. You may actually leak more or feel soreness at the lower abdomen or rectum.  YOUR HOME EXERCISE PROGRAM  LONG HOLDS: Position: on back  Inhale and then exhale. Then squeeze the muscle and count aloud for 3 seconds. Rest with three long breaths. (Be sure to let  belly sink in with exhales and not push outward)  Perform  repetitions, 3 times/day   On 3rd week -5 weeks onward, _5_  sec  hold x _5_ reps x 5 times a day   On 6-8 weeks, _7_ sec holds, x5 __reps x 5 times a day'  Gradually progress   SHORT HOLDS: Position: on back, sitting   Inhale and then exhale. Then squeeze the muscle.  (Be sure to let belly sink in with exhales and not push outward)  Perform 5 repetitions, 5  Times/day  ___________________                       DECREASE DOWNWARD PRESSURE ON  YOUR PELVIC FLOOR, ABDOMINAL, LOW BACK MUSCLES       PRESERVE YOUR PELVIC HEALTH LONG-TERM   ** SQUEEZE pelvic floor BEFORE YOUR SNEEZE, COUGH, LAUGH   ** EXHALE BEFORE YOU RISE AGAINST GRAVITY (lifting, sit to stand, from squat to stand)   ** LOG ROLL OUT OF BED INSTEAD OF CRUNCH/SIT-UP
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
## 2016-11-11 ENCOUNTER — Encounter: Payer: Self-pay | Admitting: Physical Therapy

## 2016-11-24 ENCOUNTER — Encounter: Payer: Self-pay | Admitting: Physician Assistant

## 2016-11-24 ENCOUNTER — Ambulatory Visit: Payer: Self-pay | Admitting: Physician Assistant

## 2016-11-24 VITALS — BP 110/70 | HR 80 | Temp 98.4°F | Resp 16

## 2016-11-24 DIAGNOSIS — J0100 Acute maxillary sinusitis, unspecified: Secondary | ICD-10-CM

## 2016-11-24 MED ORDER — AMOXICILLIN 875 MG PO TABS: 875.0000 mg | ORAL_TABLET | Freq: Two times a day (BID) | ORAL | 0 refills | Status: DC

## 2016-11-24 NOTE — Progress Notes (Signed)
S: C/o runny nose and congestion for 7 days, no fever, chills, cp/sob, v/d; mucus is green and thick, cough is sporadic, c/o of facial and dental pain.  Some sore throat from the drainage  Using otc meds: nasal spray  O: PE: vitals wnl, nad, perrl eomi, normocephalic, tms dull, nasal mucosa red and swollen, throat injected, neck supple no lymph, lungs c t a, cv rrr, neuro intact  A:  Acute sinusitis   P: drink fluids, continue regular meds , use otc meds of choice, return if not improving in 5 days, return earlier if worsening , amoxil 875mg  bid x 10d

## 2016-11-25 ENCOUNTER — Encounter: Payer: Self-pay | Admitting: Physical Therapy

## 2016-12-02 ENCOUNTER — Encounter: Payer: Self-pay | Admitting: Internal Medicine

## 2016-12-02 ENCOUNTER — Ambulatory Visit (INDEPENDENT_AMBULATORY_CARE_PROVIDER_SITE_OTHER): Payer: Managed Care, Other (non HMO) | Admitting: Internal Medicine

## 2016-12-02 DIAGNOSIS — D869 Sarcoidosis, unspecified: Secondary | ICD-10-CM

## 2016-12-02 DIAGNOSIS — R32 Unspecified urinary incontinence: Secondary | ICD-10-CM | POA: Diagnosis not present

## 2016-12-02 DIAGNOSIS — E7800 Pure hypercholesterolemia, unspecified: Secondary | ICD-10-CM | POA: Diagnosis not present

## 2016-12-02 DIAGNOSIS — F439 Reaction to severe stress, unspecified: Secondary | ICD-10-CM

## 2016-12-02 DIAGNOSIS — R7989 Other specified abnormal findings of blood chemistry: Secondary | ICD-10-CM

## 2016-12-02 DIAGNOSIS — R739 Hyperglycemia, unspecified: Secondary | ICD-10-CM

## 2016-12-02 DIAGNOSIS — R945 Abnormal results of liver function studies: Secondary | ICD-10-CM

## 2016-12-02 DIAGNOSIS — N951 Menopausal and female climacteric states: Secondary | ICD-10-CM | POA: Diagnosis not present

## 2016-12-02 DIAGNOSIS — K219 Gastro-esophageal reflux disease without esophagitis: Secondary | ICD-10-CM

## 2016-12-02 DIAGNOSIS — Z6830 Body mass index (BMI) 30.0-30.9, adult: Secondary | ICD-10-CM | POA: Diagnosis not present

## 2016-12-02 MED ORDER — FLUTICASONE PROPIONATE 50 MCG/ACT NA SUSP: NASAL | 2 refills | Status: DC
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
## 2016-12-06 ENCOUNTER — Encounter: Payer: Self-pay | Admitting: Internal Medicine

## 2016-12-06 NOTE — Assessment & Plan Note (Signed)
Low carb diet and exercise.  Follow met b and a1c.   

## 2016-12-06 NOTE — Assessment & Plan Note (Signed)
On crestor.  Low cholesterol diet and exercise.  Follow lipid panel and liver function tests.   

## 2016-12-06 NOTE — Assessment & Plan Note (Signed)
Sees Dr Leonides Schanz.  On estrogen.  Wants to remain on the medication.  Follow.

## 2016-12-06 NOTE — Assessment & Plan Note (Signed)
Followed by Dr Raul Del.  Stable.

## 2016-12-06 NOTE — Assessment & Plan Note (Signed)
Discussed diet and exercise.  Follow.  

## 2016-12-06 NOTE — Assessment & Plan Note (Signed)
She is concerned regarding her memory.  States has some trouble focusing.  Has no problems doing her work or completing task at work.  Some increased stress with this, but she feels she is overall handling things relatively well.  On zoloft.  Mini mental status exam completed.  Did well.  Oriented to time, place, date, etc.  Able to subtract serial 7s, spell world backwards and recall 3/3 objects after 5 minutes.  Discussed at length with her today.  Discussed some behavior modifications to help with focus, etc.  Discussed formal evaluation.  She declines.  Will follow.

## 2016-12-06 NOTE — Assessment & Plan Note (Signed)
Controlled on current regimen.   

## 2016-12-06 NOTE — Assessment & Plan Note (Signed)
Has seen Dr Lonia Blood.  On vesicare.  Symptoms have improved.  Concerned regarding cost of the medication.  Will d/w pharmacist regarding help with cost of medication or may have to change medication.

## 2016-12-06 NOTE — Assessment & Plan Note (Signed)
Last check wnl.  Follow.  Diet and exercise.

## 2017-01-02 ENCOUNTER — Other Ambulatory Visit: Payer: Self-pay | Admitting: Internal Medicine

## 2017-02-18 ENCOUNTER — Encounter: Payer: Self-pay | Admitting: Physician Assistant

## 2017-02-18 ENCOUNTER — Ambulatory Visit: Payer: Self-pay | Admitting: Physician Assistant

## 2017-02-18 VITALS — BP 110/70 | HR 108 | Temp 99.6°F | Resp 18

## 2017-02-18 DIAGNOSIS — R6889 Other general symptoms and signs: Secondary | ICD-10-CM

## 2017-02-18 LAB — POCT INFLUENZA A/B
Influenza A, POC: POSITIVE — AB
Influenza B, POC: NEGATIVE

## 2017-02-18 MED ORDER — PSEUDOEPH-BROMPHEN-DM 30-2-10 MG/5ML PO SYRP: 5.0000 mL | ORAL_SOLUTION | Freq: Four times a day (QID) | ORAL | 0 refills | Status: DC | PRN

## 2017-02-18 MED ORDER — IBUPROFEN 800 MG PO TABS: 800.0000 mg | ORAL_TABLET | Freq: Three times a day (TID) | ORAL | 0 refills | Status: DC | PRN

## 2017-02-18 MED ORDER — OSELTAMIVIR PHOSPHATE 75 MG PO CAPS: 75.0000 mg | ORAL_CAPSULE | Freq: Two times a day (BID) | ORAL | 0 refills | Status: DC

## 2017-02-18 NOTE — Progress Notes (Signed)
   Subjective: Upper respiratory infection    [name], female    DOB: August 08, 1950, 67 y.o.   MRN: [id number]  HPI Patient presents today onset of fever, body aches, nasal congestion and runny nose, and cough.  Patient denies nausea vomiting diarrhea.  Patient is not taking flu shot for this season.   Review of Systems    Negative except for complaint Objective:   Physical Exam HEENT remarkable edematous nasal turbinates clear rhinorrhea.  Pharynx is erythematous postnasal drainage.  Neck is supple without adenopathy.  Lungs are clear to auscultation with nonproductive cough.  Heart is regular rate and rhythm.  Rapid flu test was positive for influenza A.       Assessment & Plan: Influenza  Patient get a prescription for Tamiflu, Bromfed-DM, and ibuprofen.  Patient given a work note.  Patient advised follow-up PCP if no improvement in 3-5 days.

## 2017-03-13 ENCOUNTER — Other Ambulatory Visit: Payer: Self-pay

## 2017-03-13 DIAGNOSIS — E7800 Pure hypercholesterolemia, unspecified: Secondary | ICD-10-CM

## 2017-03-13 DIAGNOSIS — R413 Other amnesia: Secondary | ICD-10-CM

## 2017-03-13 NOTE — Addendum Note (Signed)
Addended by: Carlene Coria on: 03/13/2017 09:18 AM   Modules accepted: Level of Service

## 2017-03-14 LAB — VITAMIN B12: VITAMIN B 12: 1445 pg/mL — AB (ref 232–1245)

## 2017-03-14 LAB — CBC WITH DIFFERENTIAL/PLATELET
BASOS: 1 %
Basophils Absolute: 0 10*3/uL (ref 0.0–0.2)
EOS (ABSOLUTE): 0.1 10*3/uL (ref 0.0–0.4)
Eos: 2 %
Hematocrit: 41.2 % (ref 34.0–46.6)
Hemoglobin: 13.3 g/dL (ref 11.1–15.9)
IMMATURE GRANS (ABS): 0 10*3/uL (ref 0.0–0.1)
IMMATURE GRANULOCYTES: 0 %
LYMPHS: 36 %
Lymphocytes Absolute: 1.7 10*3/uL (ref 0.7–3.1)
MCH: 29.8 pg (ref 26.6–33.0)
MCHC: 32.3 g/dL (ref 31.5–35.7)
MCV: 92 fL (ref 79–97)
MONOS ABS: 0.3 10*3/uL (ref 0.1–0.9)
Monocytes: 7 %
NEUTROS PCT: 54 %
Neutrophils Absolute: 2.6 10*3/uL (ref 1.4–7.0)
PLATELETS: 255 10*3/uL (ref 150–379)
RBC: 4.46 x10E6/uL (ref 3.77–5.28)
RDW: 13.3 % (ref 12.3–15.4)
WBC: 4.7 10*3/uL (ref 3.4–10.8)

## 2017-03-14 LAB — RPR QUALITATIVE: RPR: NONREACTIVE

## 2017-03-14 LAB — COMPREHENSIVE METABOLIC PANEL
A/G RATIO: 1.7 (ref 1.2–2.2)
ALT: 22 IU/L (ref 0–32)
AST: 25 IU/L (ref 0–40)
Albumin: 4.3 g/dL (ref 3.6–4.8)
Alkaline Phosphatase: 48 IU/L (ref 39–117)
BUN/Creatinine Ratio: 20 (ref 12–28)
BUN: 15 mg/dL (ref 8–27)
Bilirubin Total: 0.5 mg/dL (ref 0.0–1.2)
CALCIUM: 9.2 mg/dL (ref 8.7–10.3)
CO2: 26 mmol/L (ref 20–29)
CREATININE: 0.75 mg/dL (ref 0.57–1.00)
Chloride: 102 mmol/L (ref 96–106)
GFR, EST AFRICAN AMERICAN: 96 mL/min/{1.73_m2} (ref >59)
GFR, EST NON AFRICAN AMERICAN: 83 mL/min/{1.73_m2} (ref >59)
GLUCOSE: 103 mg/dL — AB (ref 65–99)
Globulin, Total: 2.5 g/dL (ref 1.5–4.5)
POTASSIUM: 4.4 mmol/L (ref 3.5–5.2)
Sodium: 140 mmol/L (ref 134–144)
TOTAL PROTEIN: 6.8 g/dL (ref 6.0–8.5)

## 2017-03-14 LAB — LIPID PANEL
CHOLESTEROL TOTAL: 206 mg/dL — AB (ref 100–199)
Chol/HDL Ratio: 4.8 ratio — ABNORMAL HIGH (ref 0.0–4.4)
HDL: 43 mg/dL (ref >39)
LDL Calculated: 132 mg/dL — ABNORMAL HIGH (ref 0–99)
TRIGLYCERIDES: 155 mg/dL — AB (ref 0–149)
VLDL CHOLESTEROL CAL: 31 mg/dL (ref 5–40)

## 2017-03-14 LAB — TSH: TSH: 2.61 u[IU]/mL (ref 0.450–4.500)

## 2017-03-31 ENCOUNTER — Encounter: Payer: Self-pay | Admitting: *Deleted

## 2017-04-02 ENCOUNTER — Ambulatory Visit (INDEPENDENT_AMBULATORY_CARE_PROVIDER_SITE_OTHER): Payer: Managed Care, Other (non HMO) | Admitting: Internal Medicine

## 2017-04-02 VITALS — BP 110/74 | HR 72 | Temp 97.8°F | Resp 18 | Ht 63.0 in | Wt 174.6 lb

## 2017-04-02 DIAGNOSIS — R739 Hyperglycemia, unspecified: Secondary | ICD-10-CM

## 2017-04-02 DIAGNOSIS — R32 Unspecified urinary incontinence: Secondary | ICD-10-CM | POA: Diagnosis not present

## 2017-04-02 DIAGNOSIS — F439 Reaction to severe stress, unspecified: Secondary | ICD-10-CM | POA: Diagnosis not present

## 2017-04-02 DIAGNOSIS — Z0000 Encounter for general adult medical examination without abnormal findings: Secondary | ICD-10-CM | POA: Diagnosis not present

## 2017-04-02 DIAGNOSIS — D869 Sarcoidosis, unspecified: Secondary | ICD-10-CM

## 2017-04-02 DIAGNOSIS — E7800 Pure hypercholesterolemia, unspecified: Secondary | ICD-10-CM

## 2017-04-02 DIAGNOSIS — Z6830 Body mass index (BMI) 30.0-30.9, adult: Secondary | ICD-10-CM

## 2017-04-02 DIAGNOSIS — K219 Gastro-esophageal reflux disease without esophagitis: Secondary | ICD-10-CM | POA: Diagnosis not present

## 2017-04-02 DIAGNOSIS — R945 Abnormal results of liver function studies: Secondary | ICD-10-CM | POA: Diagnosis not present

## 2017-04-02 DIAGNOSIS — R7989 Other specified abnormal findings of blood chemistry: Secondary | ICD-10-CM
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
## 2017-04-03 NOTE — Telephone Encounter (Signed)
Unread mychart message mailed to patient 

## 2017-04-05 ENCOUNTER — Encounter: Payer: Self-pay | Admitting: Internal Medicine

## 2017-04-05 NOTE — Assessment & Plan Note (Signed)
Has seen Dr Lonia Blood.  On vesicare.  She feels bladder doing better and wants to try a trial off medication.  Follow.  If needs to restart medication, will need cheaper alternative.

## 2017-04-05 NOTE — Assessment & Plan Note (Signed)
Overall appears to be doing better.  Follow.  

## 2017-04-05 NOTE — Assessment & Plan Note (Signed)
Physical today 04/02/17.  Mammogram 08/15/16 - Birads I.  Pelvic and pap smears through gyn.  Colonoscopy 12/18/14 - recommended f/u in 5 years.

## 2017-04-05 NOTE — Assessment & Plan Note (Signed)
On crestor.  Low cholesterol diet and exercise.  Follow lipid panel and liver function tests.   

## 2017-04-05 NOTE — Assessment & Plan Note (Signed)
Diet and exercise.  Follow.  

## 2017-04-05 NOTE — Assessment & Plan Note (Signed)
Follow liver panel.  Continue diet and exercise.   °

## 2017-04-05 NOTE — Assessment & Plan Note (Signed)
Low carb diet and exercise.  Follow met b and a1c.   

## 2017-04-05 NOTE — Assessment & Plan Note (Signed)
Controlled on current regimen.  Follow.  

## 2017-04-05 NOTE — Assessment & Plan Note (Signed)
Followed by Dr Raul Del.  Stable.

## 2017-04-15 ENCOUNTER — Encounter: Payer: Self-pay | Admitting: Internal Medicine

## 2017-04-15 ENCOUNTER — Ambulatory Visit: Payer: Managed Care, Other (non HMO) | Admitting: Internal Medicine

## 2017-04-15 ENCOUNTER — Telehealth: Payer: Self-pay | Admitting: Internal Medicine

## 2017-04-15 DIAGNOSIS — S1086XA Insect bite of other specified part of neck, initial encounter: Secondary | ICD-10-CM

## 2017-04-15 DIAGNOSIS — W57XXXA Bitten or stung by nonvenomous insect and other nonvenomous arthropods, initial encounter: Secondary | ICD-10-CM

## 2017-04-15 MED ORDER — DOXYCYCLINE HYCLATE 100 MG PO TABS: 100.0000 mg | ORAL_TABLET | Freq: Two times a day (BID) | ORAL | 0 refills | Status: DC

## 2017-04-15 NOTE — Patient Instructions (Addendum)
Take a probiotic daily while you are on the antibiotics and for two weeks after completing the antibiotics.    Examples of probiotics:  Align (tablet), florastor, culturelle  Call with update over the next week.

## 2017-04-15 NOTE — Telephone Encounter (Signed)
Pt seen today
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
## 2017-04-18 ENCOUNTER — Encounter: Payer: Self-pay | Admitting: Internal Medicine

## 2017-04-18 ENCOUNTER — Telehealth: Payer: Self-pay | Admitting: Internal Medicine

## 2017-04-18 DIAGNOSIS — W57XXXA Bitten or stung by nonvenomous insect and other nonvenomous arthropods, initial encounter: Secondary | ICD-10-CM | POA: Insufficient documentation

## 2017-04-18 NOTE — Telephone Encounter (Signed)
Sent my chart message for update.   

## 2017-04-18 NOTE — Assessment & Plan Note (Addendum)
S/p tick bite - posterior neck.  Surrounding erythema and lymphadenopathy.  Concerned regarding possible infection.  Treat with doxycycline as directed.  Probiotics as directed.  Follow.  Call with update.

## 2017-04-24 ENCOUNTER — Encounter: Payer: Self-pay | Admitting: Internal Medicine

## 2017-05-12 ENCOUNTER — Other Ambulatory Visit: Payer: Self-pay | Admitting: Internal Medicine

## 2017-06-08 ENCOUNTER — Ambulatory Visit: Payer: Self-pay | Admitting: *Deleted

## 2017-06-08 ENCOUNTER — Encounter: Payer: Self-pay | Admitting: Internal Medicine

## 2017-06-08 NOTE — Telephone Encounter (Addendum)
Paint reports pain is constant not intermittant      Reason for Disposition . [1] MODERATE pain (e.g., interferes with normal activities) AND [2] pain comes and goes (cramps) AND [3] present > 24 hours  (Exception: pain with Vomiting or Diarrhea - see that Guideline)  Answer Assessment - Initial Assessment Questions 1. LOCATION: "Where does it hurt?"      Both sides  Above  Hip  Bones   2. RADIATION: "Does the pain shoot anywhere else?" (e.g., chest, back)       No  3. ONSET: "When did the pain begin?" (e.g., minutes, hours or days ago)         3  Days  Ago  4. SUDDEN: "Gradual or sudden onset?"         Sudden onset   5. PATTERN "Does the pain come and go, or is it constant?"    - If constant: "Is it getting better, staying the same, or worsening?"      (Note: Constant means the pain never goes away completely; most serious pain is constant and it progresses)     - If intermittent: "How long does it last?" "Do you have pain now?"     (Note: Intermittent means the pain goes away completely between bouts)        Constant   6. SEVERITY: "How bad is the pain?"  (e.g., Scale 1-10; mild, moderate, or severe)   - MILD (1-3): doesn't interfere with normal activities, abdomen soft and not tender to touch    - MODERATE (4-7): interferes with normal activities or awakens from sleep, tender to touch    - SEVERE (8-10): excruciating pain, doubled over, unable to do any normal activities       Moderate   7. RECURRENT SYMPTOM: "Have you ever had this type of abdominal pain before?" If so, ask: "When was the last time?" and "What happened that time?"        No  8. CAUSE: "What do you think is causing the abdominal pain?"      No  9. RELIEVING/AGGRAVATING FACTORS: "What makes it better or worse?" (e.g., movement, antacids, bowel movement)      No 10. OTHER SYMPTOMS: "Has there been any vomiting, diarrhea, constipation, or urine problems?"      No 11. PREGNANCY: "Is there any chance you are pregnant?"  "When was your last menstrual period?"       n/a  Protocols used: ABDOMINAL PAIN - [facility]

## 2017-06-08 NOTE — Telephone Encounter (Signed)
Pt  Reports the pain is constant and not intermittent .  moderate in degree poke  With Patina  In the office  Who spoke to Dr Nicki Reaper and patient was  Advised to go to the er   Reason for Disposition . [1] MILD-MODERATE pain AND [2] constant AND [3] present > 2 hours  Protocols used: ABDOMINAL PAIN - [facility]

## 2017-06-08 NOTE — Telephone Encounter (Signed)
Talked with patient today per Dr. Nicki Reaper considering that she is having some significant abdominal pain, Dr. Nicki Reaper is wanting her to go to the ED due to possibly needing a scan and labs which all can not be done here. Patient verbalized understanding.

## 2017-06-08 NOTE — Telephone Encounter (Signed)
Please triage

## 2017-06-08 NOTE — Telephone Encounter (Signed)
Called patient on the number listed in message twice to triage. No answer. Left 2 messages.

## 2017-06-10 ENCOUNTER — Ambulatory Visit
Admission: RE | Admit: 2017-06-10 | Discharge: 2017-06-10 | Disposition: A | Discharge status: Home / Self Care | Payer: Managed Care, Other (non HMO) | Source: Ambulatory Visit | Attending: Family Medicine | Admitting: Family Medicine

## 2017-06-10 ENCOUNTER — Ambulatory Visit: Payer: Self-pay | Admitting: Family Medicine

## 2017-06-10 VITALS — BP 139/66 | HR 81 | Temp 98.0°F | Resp 20

## 2017-06-10 DIAGNOSIS — R1030 Lower abdominal pain, unspecified: Secondary | ICD-10-CM

## 2017-06-10 LAB — POCT URINALYSIS DIPSTICK
Bilirubin, UA: NEGATIVE
Blood, UA: NEGATIVE
Glucose, UA: NEGATIVE
Ketones, UA: NEGATIVE
Leukocytes, UA: NEGATIVE
NITRITE UA: NEGATIVE
PH UA: 6 (ref 5.0–8.0)
PROTEIN UA: POSITIVE — AB
UROBILINOGEN UA: 0.2 U/dL

## 2017-06-10 NOTE — Progress Notes (Signed)
Subjective: abdominal pain      Christy Escobar is a 67 y.o. female who presents for evaluation of bilateral lower abdominal pain. Onset was 3-4 days ago. Symptoms have been gradually improving. The pain is described as aching, and mild in intensity.  Patient denies any radiation of the pain.  Aggravating factors: none.  Alleviating factors: none. Associated symptoms: urinary frequency. The patient denies anorexia, arthralagias, belching, chills, constipation, diarrhea, dysuria, fever, flatus, weight loss, headache, hematochezia, hematuria, melena, myalgias, nausea, sweats, or vomiting.  Patient reports that she had bilateral low back pain 3 days ago that has resolved.  Patient reports suprapubic "aching" with urination for the last few days intermittently.  Patient reports mild fatigue but is unsure the duration or if it is related. Patient denies any recent changes in her bowel movements.  Patient reports that she usually only has a bowel movement 2-3 times a week.  Last bowel movement was normal.  Patient reports she has maintained a healthy, normal appetite.  Patient reports a history of cervical dysplasia in the remote past and colon polyps a few years ago that were removed. Patient has a f/u colonoscopy 5 years from the original. Denies any evidence of GIB.  Patient denies any relevant GI history.  Relevant family history: Grandfather had colon cancer.  Negative otherwise.  Review of Systems Pertinent items noted in HPI and remainder of comprehensive ROS otherwise negative.     Objective:    BP 139/66 (BP Location: Left Arm, Patient Position: Sitting, Cuff Size: Normal)   Pulse 81   Temp 98 F (36.7 C) (Oral)   Resp 20   LMP 12/14/1984   SpO2 96%  General appearance: alert, cooperative, appears stated age and no distress Back:  No CVA tenderness. Abdomen: Soft, bowel sounds normal, no masses, no organomegaly.  Mild bilateral lower abdominal tenderness with deep palpation only.  No referred  pain, rigidity, guarding, or rebound tenderness.  Negative Rovsing sign, obturator sign, psoas sign, and Markel sign. Pulses: 2+ and symmetric Skin: Skin color, texture, turgor normal. No rashes or lesions Lymph nodes: Cervical, supraclavicular, and axillary nodes normal. Neurologic: Grossly normal   Diagnostic Results:  Urine dipstick: Negative for all components except for trace protein present. KUB:    EXAM: ABDOMEN - 1 VIEW  COMPARISON:  None.  FINDINGS: The bowel gas pattern is normal. No radio-opaque calculi or other significant radiographic abnormality are seen.  IMPRESSION: Negative.   Electronically Signed   By: Misty Stanley M.D.   On: 06/10/2017 12:22  Assessment:   Abdominal pain, non specific   Urinary frequency   Plan:   Discussed the limitations of our available testing and the fact that we cannot get a CT from our clinic and that in order to best assess her abdominal pain this would have to be done in a facility that has this available, such as the emergency department. Patient does not want to go to the emergency room and feels like the abdominal pain is improving on its own. Discussed that this may be necessary if the patient's pain does not resolve in the next few days, worsens, or she develops new symptoms. CBC, CMP, and urine culture pending. Advised patient to call and schedule a follow-up appointment today with her PCP for Friday for reevaluation. Consulted collaborating physician Dr. Rosanna Randy, who is in agreement with this plan. Provided patient with detailed education regarding red flag symptoms and indications to seek medical care.  Patient verbally agreed.

## 2017-06-10 NOTE — Telephone Encounter (Signed)
Copied from [place] [phone]. Topic: Appointment Scheduling - Scheduling Inquiry for Clinic >> Jun 10, 2017  2:44 PM Bea Graff, NT wrote: Reason for CRM: Pt states that she went to the health clinic at the hospital and saw [place], [place] and was advised to follow-up with Dr. Nicki Reaper within the next few days. Can she be worked in?

## 2017-06-11 LAB — CBC WITH DIFFERENTIAL/PLATELET
BASOS ABS: 0 10*3/uL (ref 0.0–0.2)
Basos: 1 %
EOS (ABSOLUTE): 0.2 10*3/uL (ref 0.0–0.4)
EOS: 3 %
HEMATOCRIT: 41 % (ref 34.0–46.6)
HEMOGLOBIN: 13.8 g/dL (ref 11.1–15.9)
IMMATURE GRANULOCYTES: 0 %
Immature Grans (Abs): 0 10*3/uL (ref 0.0–0.1)
Lymphocytes Absolute: 2.4 10*3/uL (ref 0.7–3.1)
Lymphs: 40 %
MCH: 30.5 pg (ref 26.6–33.0)
MCHC: 33.7 g/dL (ref 31.5–35.7)
MCV: 91 fL (ref 79–97)
Monocytes Absolute: 0.4 10*3/uL (ref 0.1–0.9)
Monocytes: 7 %
NEUTROS PCT: 49 %
Neutrophils Absolute: 2.9 10*3/uL (ref 1.4–7.0)
Platelets: 271 10*3/uL (ref 150–450)
RBC: 4.52 x10E6/uL (ref 3.77–5.28)
RDW: 13.2 % (ref 12.3–15.4)
WBC: 5.9 10*3/uL (ref 3.4–10.8)

## 2017-06-11 LAB — COMPREHENSIVE METABOLIC PANEL
ALBUMIN: 4.4 g/dL (ref 3.6–4.8)
ALT: 25 IU/L (ref 0–32)
AST: 31 IU/L (ref 0–40)
Albumin/Globulin Ratio: 1.6 (ref 1.2–2.2)
Alkaline Phosphatase: 59 IU/L (ref 39–117)
BUN/Creatinine Ratio: 29 — ABNORMAL HIGH (ref 12–28)
BUN: 18 mg/dL (ref 8–27)
Bilirubin Total: 0.3 mg/dL (ref 0.0–1.2)
CALCIUM: 9.2 mg/dL (ref 8.7–10.3)
CO2: 24 mmol/L (ref 20–29)
CREATININE: 0.62 mg/dL (ref 0.57–1.00)
Chloride: 103 mmol/L (ref 96–106)
GFR calc Af Amer: 108 mL/min/{1.73_m2} (ref >59)
GFR, EST NON AFRICAN AMERICAN: 94 mL/min/{1.73_m2} (ref >59)
GLOBULIN, TOTAL: 2.7 g/dL (ref 1.5–4.5)
GLUCOSE: 100 mg/dL — AB (ref 65–99)
Potassium: 4.2 mmol/L (ref 3.5–5.2)
SODIUM: 140 mmol/L (ref 134–144)
Total Protein: 7.1 g/dL (ref 6.0–8.5)

## 2017-06-12 LAB — URINE CULTURE

## 2017-06-12 NOTE — Progress Notes (Signed)
Dear Ms. Sedlack, I wanted to let you know that all of your blood work came back normal and your urine culture is negative for infection.  I tried calling you but was unable to reach you. Follow-up with your primary care provider as we discussed.  Hope you are feeling better!

## 2017-07-29 ENCOUNTER — Other Ambulatory Visit: Payer: Self-pay

## 2017-07-29 DIAGNOSIS — E7800 Pure hypercholesterolemia, unspecified: Secondary | ICD-10-CM

## 2017-07-29 DIAGNOSIS — L659 Nonscarring hair loss, unspecified: Secondary | ICD-10-CM

## 2017-07-30 LAB — LIPID PANEL
CHOL/HDL RATIO: 5.9 ratio — AB (ref 0.0–4.4)
Cholesterol, Total: 236 mg/dL — ABNORMAL HIGH (ref 100–199)
HDL: 40 mg/dL (ref >39)
LDL Calculated: 149 mg/dL — ABNORMAL HIGH (ref 0–99)
Triglycerides: 234 mg/dL — ABNORMAL HIGH (ref 0–149)
VLDL CHOLESTEROL CAL: 47 mg/dL — AB (ref 5–40)

## 2017-07-30 LAB — HEPATIC FUNCTION PANEL
ALBUMIN: 4.2 g/dL (ref 3.6–4.8)
ALK PHOS: 51 IU/L (ref 39–117)
ALT: 34 IU/L — ABNORMAL HIGH (ref 0–32)
AST: 24 IU/L (ref 0–40)
Bilirubin Total: 0.6 mg/dL (ref 0.0–1.2)
Bilirubin, Direct: 0.12 mg/dL (ref 0.00–0.40)
TOTAL PROTEIN: 7 g/dL (ref 6.0–8.5)

## 2017-07-30 LAB — BASIC METABOLIC PANEL
BUN/Creatinine Ratio: 21 (ref 12–28)
BUN: 18 mg/dL (ref 8–27)
CALCIUM: 9.5 mg/dL (ref 8.7–10.3)
CO2: 24 mmol/L (ref 20–29)
CREATININE: 0.84 mg/dL (ref 0.57–1.00)
Chloride: 101 mmol/L (ref 96–106)
GFR calc Af Amer: 83 mL/min/{1.73_m2} (ref >59)
GFR calc non Af Amer: 72 mL/min/{1.73_m2} (ref >59)
Glucose: 104 mg/dL — ABNORMAL HIGH (ref 65–99)
Potassium: 4.3 mmol/L (ref 3.5–5.2)
Sodium: 139 mmol/L (ref 134–144)

## 2017-07-30 LAB — TSH: TSH: 2.62 u[IU]/mL (ref 0.450–4.500)

## 2017-07-30 LAB — FERRITIN: FERRITIN: 162 ng/mL — AB (ref 15–150)

## 2017-07-31 ENCOUNTER — Encounter: Payer: Self-pay | Admitting: Internal Medicine

## 2017-08-06 ENCOUNTER — Encounter: Payer: Self-pay | Admitting: Internal Medicine

## 2017-08-06 ENCOUNTER — Ambulatory Visit: Payer: Managed Care, Other (non HMO) | Admitting: Internal Medicine

## 2017-08-06 DIAGNOSIS — R7989 Other specified abnormal findings of blood chemistry: Secondary | ICD-10-CM

## 2017-08-06 DIAGNOSIS — R945 Abnormal results of liver function studies: Secondary | ICD-10-CM | POA: Diagnosis not present

## 2017-08-06 DIAGNOSIS — Z6831 Body mass index (BMI) 31.0-31.9, adult: Secondary | ICD-10-CM

## 2017-08-06 DIAGNOSIS — R739 Hyperglycemia, unspecified: Secondary | ICD-10-CM

## 2017-08-06 DIAGNOSIS — E7800 Pure hypercholesterolemia, unspecified: Secondary | ICD-10-CM | POA: Diagnosis not present

## 2017-08-06 DIAGNOSIS — K219 Gastro-esophageal reflux disease without esophagitis: Secondary | ICD-10-CM

## 2017-08-06 DIAGNOSIS — D869 Sarcoidosis, unspecified: Secondary | ICD-10-CM

## 2017-08-06 DIAGNOSIS — L659 Nonscarring hair loss, unspecified: Secondary | ICD-10-CM

## 2017-08-06 DIAGNOSIS — N951 Menopausal and female climacteric states: Secondary | ICD-10-CM
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
## 2017-08-09 ENCOUNTER — Encounter: Payer: Self-pay | Admitting: Internal Medicine

## 2017-08-09 DIAGNOSIS — L659 Nonscarring hair loss, unspecified: Secondary | ICD-10-CM | POA: Insufficient documentation

## 2017-08-09 NOTE — Assessment & Plan Note (Signed)
Off medication currently.  Follow for return of symptoms.

## 2017-08-09 NOTE — Assessment & Plan Note (Signed)
Has seen dermatology.  Had labs.  Has f/u planned.

## 2017-08-09 NOTE — Assessment & Plan Note (Signed)
Discussed diet and exercise.  Discussed weight loss.  ALT 34.  Remainder of liver panel wnl.  Follow.

## 2017-08-09 NOTE — Assessment & Plan Note (Signed)
Discussed diet and exercise.  Follow.  

## 2017-08-09 NOTE — Assessment & Plan Note (Signed)
Low carb diet and exercise.  Follow met b and a1c.   

## 2017-08-09 NOTE — Assessment & Plan Note (Signed)
Has been followed by Dr Raul Del.  Has been stable.

## 2017-08-09 NOTE — Assessment & Plan Note (Signed)
Was on crestor. Off now.  Discussed cholesterol results.  Low cholesterol diet and exercise.  She wants to remain off cholesterol medication for now.  Follow lipid panel.

## 2017-08-09 NOTE — Assessment & Plan Note (Signed)
Followed by Dr Leonides Schanz.  Was on estrogen.  Stopped recently with hair loss.  Was concerned her medication was contributing.  She stopped all medications.  Follow.

## 2017-08-21 ENCOUNTER — Encounter: Payer: Self-pay | Admitting: Internal Medicine

## 2017-08-21 NOTE — Telephone Encounter (Signed)
Called and spoke to [place].  Discussed her concerns regarding her mother.  Had discussed issues with her sister.  Plans to discuss with psychiatry regarding choice of medications.  Agrees.

## 2017-08-21 NOTE — Telephone Encounter (Signed)
I left a message on patients cell phone for her to call back and get more information. I saw in her mothers chart where you have talked to the other daughter.

## 2017-09-15 ENCOUNTER — Other Ambulatory Visit: Payer: Self-pay | Admitting: Internal Medicine

## 2017-09-15 DIAGNOSIS — Z1231 Encounter for screening mammogram for malignant neoplasm of breast: Secondary | ICD-10-CM

## 2017-10-02 ENCOUNTER — Ambulatory Visit
Admission: RE | Admit: 2017-10-02 | Discharge: 2017-10-02 | Disposition: A | Discharge status: Home / Self Care | Payer: Managed Care, Other (non HMO) | Source: Ambulatory Visit | Attending: Internal Medicine | Admitting: Internal Medicine

## 2017-10-02 DIAGNOSIS — Z1231 Encounter for screening mammogram for malignant neoplasm of breast: Secondary | ICD-10-CM | POA: Insufficient documentation

## 2017-10-12 ENCOUNTER — Other Ambulatory Visit: Payer: Self-pay | Admitting: Internal Medicine

## 2017-10-29 ENCOUNTER — Encounter: Payer: Self-pay | Admitting: Internal Medicine

## 2017-10-30 NOTE — Telephone Encounter (Signed)
Left message to call back and schedule an appt.

## 2017-11-02 ENCOUNTER — Encounter: Payer: Self-pay | Admitting: Internal Medicine

## 2017-11-02 ENCOUNTER — Ambulatory Visit: Payer: Managed Care, Other (non HMO) | Admitting: Internal Medicine

## 2017-11-02 DIAGNOSIS — L659 Nonscarring hair loss, unspecified: Secondary | ICD-10-CM

## 2017-11-02 DIAGNOSIS — R739 Hyperglycemia, unspecified: Secondary | ICD-10-CM

## 2017-11-02 DIAGNOSIS — D869 Sarcoidosis, unspecified: Secondary | ICD-10-CM

## 2017-11-02 DIAGNOSIS — F439 Reaction to severe stress, unspecified: Secondary | ICD-10-CM

## 2017-11-02 DIAGNOSIS — K219 Gastro-esophageal reflux disease without esophagitis: Secondary | ICD-10-CM

## 2017-11-02 DIAGNOSIS — R7989 Other specified abnormal findings of blood chemistry: Secondary | ICD-10-CM

## 2017-11-02 DIAGNOSIS — R945 Abnormal results of liver function studies: Secondary | ICD-10-CM | POA: Diagnosis not present

## 2017-11-02 DIAGNOSIS — Z6831 Body mass index (BMI) 31.0-31.9, adult: Secondary | ICD-10-CM

## 2017-11-02 DIAGNOSIS — E7800 Pure hypercholesterolemia, unspecified: Secondary | ICD-10-CM

## 2017-11-02 MED ORDER — SERTRALINE HCL 50 MG PO TABS: ORAL_TABLET | ORAL | 2 refills | Status: DC

## 2017-11-02 NOTE — Telephone Encounter (Signed)
Pt was scheduled
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
## 2017-11-08 ENCOUNTER — Encounter: Payer: Self-pay | Admitting: Internal Medicine

## 2017-11-08 NOTE — Assessment & Plan Note (Signed)
Has been followed by Dr Raul Del.  Stable.

## 2017-11-08 NOTE — Assessment & Plan Note (Signed)
Low carb diet and exercise.  Follow met b and a1c.

## 2017-11-08 NOTE — Assessment & Plan Note (Signed)
Controlled on current regimen.  Follow.  

## 2017-11-08 NOTE — Assessment & Plan Note (Signed)
Increased stress as outlined.  Discussed with her today. Start zoloft as directed.  Follow.

## 2017-11-08 NOTE — Assessment & Plan Note (Signed)
Low cholesterol diet and exercise.  Follow lipid panel.   

## 2017-11-08 NOTE — Assessment & Plan Note (Signed)
Discussed diet and exercise. Follow liver function tests.   

## 2017-11-08 NOTE — Assessment & Plan Note (Signed)
Has seen dermatology.  Planning to f/u with them regarding further treatment/evaluation.

## 2017-11-08 NOTE — Assessment & Plan Note (Signed)
Discussed diet and exercise.  Follow.  

## 2017-12-22 ENCOUNTER — Ambulatory Visit: Payer: Managed Care, Other (non HMO) | Admitting: Internal Medicine

## 2017-12-22 DIAGNOSIS — E7800 Pure hypercholesterolemia, unspecified: Secondary | ICD-10-CM

## 2017-12-22 DIAGNOSIS — R945 Abnormal results of liver function studies: Secondary | ICD-10-CM | POA: Diagnosis not present

## 2017-12-22 DIAGNOSIS — R7989 Other specified abnormal findings of blood chemistry: Secondary | ICD-10-CM

## 2017-12-22 DIAGNOSIS — D869 Sarcoidosis, unspecified: Secondary | ICD-10-CM

## 2017-12-22 DIAGNOSIS — K219 Gastro-esophageal reflux disease without esophagitis: Secondary | ICD-10-CM

## 2017-12-22 DIAGNOSIS — R739 Hyperglycemia, unspecified: Secondary | ICD-10-CM

## 2017-12-22 DIAGNOSIS — L659 Nonscarring hair loss, unspecified: Secondary | ICD-10-CM | POA: Diagnosis not present

## 2017-12-22 DIAGNOSIS — F439 Reaction to severe stress, unspecified: Secondary | ICD-10-CM

## 2017-12-22 MED ORDER — SERTRALINE HCL 50 MG PO TABS: ORAL_TABLET | ORAL | 2 refills | Status: DC
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
## 2017-12-27 ENCOUNTER — Encounter: Payer: Self-pay | Admitting: Internal Medicine

## 2017-12-27 NOTE — Assessment & Plan Note (Signed)
Discussed diet and exercise. Follow liver function tests.   

## 2017-12-27 NOTE — Assessment & Plan Note (Signed)
Controlled on current regimen.  Follow.  

## 2017-12-27 NOTE — Assessment & Plan Note (Signed)
Low cholesterol diet and exercise.  Follow lipid panel.   

## 2017-12-27 NOTE — Assessment & Plan Note (Signed)
Increased stress.  On zoloft.  Increased to 50mg  1 1/2 tablet q day.  Follow.

## 2017-12-27 NOTE — Assessment & Plan Note (Signed)
Has been followed by pulmonary.  Saw Dr Raul Del.  Stable.

## 2017-12-27 NOTE — Assessment & Plan Note (Signed)
Low carb diet and exercise. Follow met b and a1c.

## 2017-12-27 NOTE — Assessment & Plan Note (Signed)
Has seen dermatology.  Labs unrevealing.  Request referral to see a different dermatologist (who specializes in hair loss).

## 2018-01-12 ENCOUNTER — Encounter: Payer: Self-pay | Admitting: Internal Medicine

## 2018-01-12 DIAGNOSIS — L659 Nonscarring hair loss, unspecified: Secondary | ICD-10-CM

## 2018-01-12 NOTE — Telephone Encounter (Signed)
Attempted to reach patient. Unable to leave message.

## 2018-01-18 NOTE — Telephone Encounter (Signed)
Order has been placed for referral to dermatology [facility].  Someone should be contacting her with an appt date and time.  Also, agree with nasacort.  If persistent problems, let us know.

## 2018-01-18 NOTE — Telephone Encounter (Signed)
Called patient to follow up. She is not having any other symptoms. She is using allegra right now. I recommended trying nasacort nasal spray in the evening and let me know if not better. Patient also questioned about referral to hair specialist. Advised I would look into it. Pt advised that either one of [place] could just send her a my chart message with response instead of calling.

## 2018-02-01 DIAGNOSIS — M659 Unspecified synovitis and tenosynovitis, unspecified site: Secondary | ICD-10-CM | POA: Insufficient documentation

## 2018-02-01 DIAGNOSIS — B351 Tinea unguium: Secondary | ICD-10-CM | POA: Insufficient documentation

## 2018-02-01 DIAGNOSIS — G43909 Migraine, unspecified, not intractable, without status migrainosus: Secondary | ICD-10-CM | POA: Insufficient documentation

## 2018-02-01 DIAGNOSIS — Q6689 Other  specified congenital deformities of feet: Secondary | ICD-10-CM | POA: Insufficient documentation

## 2018-02-01 DIAGNOSIS — F329 Major depressive disorder, single episode, unspecified: Secondary | ICD-10-CM | POA: Insufficient documentation

## 2018-02-01 DIAGNOSIS — F32A Depression, unspecified: Secondary | ICD-10-CM | POA: Insufficient documentation

## 2018-02-01 DIAGNOSIS — Z87828 Personal history of other (healed) physical injury and trauma: Secondary | ICD-10-CM | POA: Insufficient documentation

## 2018-02-02 ENCOUNTER — Ambulatory Visit: Payer: Self-pay | Admitting: Adult Health

## 2018-02-02 VITALS — BP 118/68 | HR 70 | Resp 14 | Ht 63.0 in | Wt 174.0 lb

## 2018-02-02 DIAGNOSIS — Z008 Encounter for other general examination: Secondary | ICD-10-CM

## 2018-02-02 DIAGNOSIS — Z0189 Encounter for other specified special examinations: Principal | ICD-10-CM

## 2018-02-02 DIAGNOSIS — M25561 Pain in right knee: Secondary | ICD-10-CM

## 2018-02-02 MED ORDER — PREDNISONE 10 MG (21) PO TBPK: ORAL_TABLET | ORAL | 0 refills | Status: DC

## 2018-02-02 NOTE — Progress Notes (Signed)
[facility]  Subjective:     [name], female   DOB: 10-Jul-1950, 68 y.o.   MRN: [id number]  HPI   Blood pressure 118/68, pulse 70, resp. rate 14, height 5\' 3"  (1.6 m), weight 174 lb (78.9 kg), last menstrual period 12/14/1984, SpO2 96 %.  Patient is a  68 year old female  in no acute distress who comes to the clinic for a biometric screening with brief biometric screening with labs include fasting glucose and lipids.   She also reports Right knee pain that started around 3 weeks ago,  denies any injury.  Denies any past injury.  She has tried ibuprofen. Maybe " mild " swelling per patient. Denies any foot or thigh pain. Denies any calf pain. She first noticed the pain when she was bent over on her bed making the bed.   Patient  denies any fever, body aches,chills, rash, chest pain, shortness of breath, nausea, vomiting, or diarrhea.    Allergies  Allergen Reactions  . Clarithromycin Other (See Comments) and Nausea Only    Nausea, GI burning  GI burning  Nausea, GI burning   Patient Active Problem List   Diagnosis Date Noted  . Depression 02/01/2018  . History of traumatic injury to musculoskeletal system 02/01/2018  . Migraine headache 02/01/2018  . Onychomycosis due to dermatophyte 02/01/2018  . 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 Synovitis and tenosynovitis, unspecified 02/01/2018  . Hair loss 08/09/2017  . Tick bite 04/18/2017  . Stress incontinence in female 05/27/2016  . BMI 31.0-31.9,adult 08/01/2015  . Hyperglycemia 08/01/2015  . History of colonic polyps 12/20/2014  . Plantar fasciitis 12/04/2014  . Chest pain 09/03/2014  . Health care maintenance 07/06/2014  . Stress 08/01/2013  . Vaginal dryness 05/08/2013  . Elevated blood pressure 12/02/2012  . Menopausal syndrome 03/21/2012  . GERD (gastroesophageal reflux disease) 03/21/2012  . Abnormal liver function test 03/21/2012  .  Sarcoidosis 12/16/2011  . Hypercholesterolemia 12/16/2011  . Urinary incontinence 12/16/2011  . Osteopenia 12/16/2011    Current Outpatient Medications:  .  estradiol (ESTRACE) 1 MG tablet, TAKE 1 TABLET BY MOUTH ONCE DAILY, Disp: 30 tablet, Rfl: 5 .  fish oil-omega-3 fatty acids 1000 MG capsule, Take 1 g by mouth 3 (three) times daily., Disp: , Rfl:  .  fluticasone (FLONASE) 50 MCG/ACT nasal spray, USE TWO SPRAY(S) IN EACH NOSTRIL ONCE DAILY, Disp: 16 g, Rfl: 2 .  meloxicam (MOBIC) 15 MG tablet, Take 15 mg by mouth daily., Disp: , Rfl:  .  minoxidil (LONITEN) 2.5 MG tablet, TAKE 1 2 (ONE HALF) TABLET BY MOUTH ONCE DAILY, Disp: , Rfl:  .  Omega-3 1000 MG CAPS, Take by mouth., Disp: , Rfl:  .  omeprazole (PRILOSEC) 20 MG capsule, TAKE 1 CAPSULE BY MOUTH ONCE DAILY, Disp: 90 capsule, Rfl: 0 .  rosuvastatin (CRESTOR) 5 MG tablet, TAKE 1 TABLET BY MOUTH ONCE DAILY, Disp: 30 tablet, Rfl: 1 .  sertraline (ZOLOFT) 50 MG tablet, Take 1 1/2 tablet q day, Disp: 45 tablet, Rfl: 2 .  valACYclovir (VALTREX) 1000 MG tablet, TAKE 2 TABLETS BY MOUTH EVERY 12 HOURS FOR EACH EPISODE, Disp: , Rfl:  .  zolmitriptan (ZOMIG) 5 MG tablet, Take 1/2 tablet daily as needed., Disp: 10 tablet, Rfl: 0   Review of Systems  Constitutional: Negative for activity change, appetite change, chills, diaphoresis, fatigue, fever and unexpected weight change.  HENT: Negative.   Respiratory: Negative.  Cardiovascular: Negative.   Gastrointestinal: Negative.   Endocrine: Negative.   Genitourinary: Negative.   Musculoskeletal: Positive for arthralgias and joint swelling (very mild right knee ). Negative for back pain, gait problem, myalgias, neck pain and neck stiffness.  Skin: Negative.   Neurological: Negative.   Hematological: Negative.   Psychiatric/Behavioral: Negative.        Objective:   Physical Exam Vitals signs and nursing note reviewed.  Constitutional:      General: She is not in acute distress.     Appearance: Normal appearance. She is not toxic-appearing or diaphoretic.  HENT:     Head: Normocephalic and atraumatic.     Right Ear: Tympanic membrane, ear canal and external ear normal. There is no impacted cerumen.     Left Ear: Tympanic membrane, ear canal and external ear normal. There is no impacted cerumen.     Nose: Nose normal. No congestion or rhinorrhea.     Mouth/Throat:     Mouth: Mucous membranes are dry.     Pharynx: No oropharyngeal exudate or posterior oropharyngeal erythema.  Eyes:     General: No scleral icterus.       Right eye: No discharge.        Left eye: No discharge.     Extraocular Movements: Extraocular movements intact.     Conjunctiva/sclera: Conjunctivae normal.     Pupils: Pupils are equal, round, and reactive to light.  Neck:     Musculoskeletal: Normal range of motion and neck supple. No neck rigidity or muscular tenderness.     Vascular: No carotid bruit.  Cardiovascular:     Rate and Rhythm: Normal rate and regular rhythm.     Pulses: Normal pulses.     Heart sounds: No murmur. No friction rub. No gallop.   Pulmonary:     Effort: Pulmonary effort is normal. No respiratory distress.     Breath sounds: No wheezing, rhonchi or rales.  Chest:     Chest wall: No tenderness.  Musculoskeletal: Normal range of motion.        General: Swelling and tenderness present. No deformity or signs of injury.     Right hip: Normal.     Left hip: Normal.     Right knee: She exhibits swelling (very mild if any swelling anterior knee noted / no erythema ). She exhibits normal range of motion, no effusion, no ecchymosis, no deformity, no laceration, no erythema, normal alignment, no LCL laxity, normal patellar mobility, no bony tenderness, normal meniscus and no MCL laxity. Tenderness found. No medial joint line, no lateral joint line, no MCL, no LCL and no patellar tendon tenderness noted.     Left knee: Normal.     Right ankle: Normal.     Left ankle: Normal.      Right upper leg: Normal.     Left upper leg: Normal.     Right lower leg: No edema.     Left lower leg: No edema.       Legs:     Right foot: Normal.     Left foot: Normal.     Comments: Very minimal tenderness with palpation of anterior knee.  No erythema.  No difficulty bearing weight or with any range of motion   Patient moves on and off of exam table and in room without difficulty. Gait is normal in hall and in room. Patient is oriented to person place time and situation. Patient answers questions appropriately and engages in conversation.  Lymphadenopathy:     Head:     Right side of head: No submental, submandibular, tonsillar, preauricular, posterior auricular or occipital adenopathy.     Left side of head: No submental, submandibular, tonsillar, preauricular, posterior auricular or occipital adenopathy.     Cervical: No cervical adenopathy.     Right cervical: No superficial, deep or posterior cervical adenopathy.    Left cervical: No superficial, deep or posterior cervical adenopathy.  Skin:    General: Skin is warm and dry.     Capillary Refill: Capillary refill takes less than 2 seconds.  Neurological:     General: No focal deficit present.     Mental Status: She is alert and oriented to person, place, and time.     Cranial Nerves: No cranial nerve deficit.     Sensory: No sensory deficit.     Motor: No weakness.     Coordination: Coordination normal.     Gait: Gait normal.  Psychiatric:        Mood and Affect: Mood normal.        Behavior: Behavior normal.        Thought Content: Thought content normal.        Judgment: Judgment normal.        Assessment:  Encounter for biometric screening - Plan: Lipid Panel With LDL/HDL Ratio, Glucose, random, DG Knee Complete 4 Views Right  Knee pain, right anterior       Plan:     Meds ordered this encounter  Medications  . predniSONE (STERAPRED UNI-PAK 21 TAB) 10 MG (21) TBPK tablet    Sig: PO: Take 6 tablets on  day 1:Take 5 tablets day 2:Take 4 tablets day 3: Take 3 tablets day 4:Take 2 tablets day five: 5 Take 1 tablet day 6    Dispense:  21 tablet    Refill:  0   Orders Placed This Encounter  Procedures  . DG Knee Complete 4 Views Right    Standing Status:   Future    Standing Expiration Date:   03/05/2018    Order Specific Question:   Reason for Exam (SYMPTOM  OR DIAGNOSIS REQUIRED)    Answer:   knee pain x 3 weeks. Denies any injury    Order Specific Question:   Preferred imaging location?    Answer:   [facility]    Order Specific Question:   Call Results- Best Contact Number?    Answer:   [phone]    Order Specific Question:   Radiology Contrast Protocol - do NOT remove file path    Answer:   [email or web address]  . Lipid Panel With LDL/HDL Ratio  . Glucose, random      Will try prednisone dose pack to see if inflammation relieved - will order x ray of right knee for baseline. Discussed take Tylenol for pain while on Prednisone and no NSAID'S due to risk of gastrointestinal bleed and or kidney damage. \ Recheck knee in two weeks sooner if needed. Also may follow up with your Einar Pheasant, MD Discussed [facility] walk in from 1- 7pm Monday to Friday if symptoms persist or worsen at anytime.   Gave and reviewed After Visit Summary(AVS) with patient.    Please see your primary care provider for a yearly physical and labs.  I will have the office call you on your glucose and cholesterol results when they return if you have not heard within 1 week please call the office and will have you follow up  with your primary care doctor for any abnormal's. This biometric physical is not a substitute for seeing a primary care provider for a physical. Provider also recommends if you do not have a primary care provider for patient see a  primary care physician/ provider  for a routine physical and to establish primary care. Patient may chose  provider of choice. Also gave the [facility] at [phone] or web site at [email or web address] to help assist with finding a primary care doctor. Patient understands this office is acute care and no primary care is in this office.  Advised patient call the office or your primary care doctor for an appointment if no improvement within 72 hours or if any symptoms change or worsen at any time  Advised ER or urgent Care if after hours or on weekend. Call 911 for emergency symptoms at any time.Patinet verbalized understanding of all instructions given/reviewed and treatment plan and has no further questions or concerns at this time.    Patient verbalized understanding of all instructions given and denies any further questions at this time.

## 2018-02-02 NOTE — Patient Instructions (Addendum)
Please see your primary care provider for a yearly physical and labs.  I will have the office call you on your glucose and cholesterol results when they return if you have not heard within 1 week please call the office and will have you follow up with your primary care doctor for any abnormal's. This biometric physical is not a substitute for seeing a primary care provider for a physical. Provider also recommends if you do not have a primary care provider for patient see a  primary care physician/ provider  for a routine physical and to establish primary care. Patient may chose provider of choice. Also gave the [facility]  PHYSICIAN/PROVIDER  REFERRAL LINE at [phone]- [phone] or web site at [email or web address] to help assist with finding a primary care doctor. Patient understands this office is acute care and no primary care is in this office.    Health Maintenance, Female Adopting a healthy lifestyle and getting preventive care can go a long way to promote health and wellness. Talk with your health care provider about what schedule of regular examinations is right for you. This is a good chance for you to check in with your provider about disease prevention and staying healthy. In between checkups, there are plenty of things you can do on your own. Experts have done a lot of research about which lifestyle changes and preventive measures are most likely to keep you healthy. Ask your health care provider for more information. Weight and diet Eat a healthy diet  Be sure to include plenty of vegetables, fruits, low-fat dairy products, and lean protein.  Do not eat a lot of foods high in solid fats, added sugars, or salt.  Get regular exercise. This is one of the most important things you can do for your health. ? Most adults should exercise for at least 150 minutes each week. The exercise should increase your heart rate and make you sweat (moderate-intensity exercise). ? Most adults should also do  strengthening exercises at least twice a week. This is in addition to the moderate-intensity exercise. Maintain a healthy weight  Body mass index (BMI) is a measurement that can be used to identify possible weight problems. It estimates body fat based on height and weight. Your health care provider can help determine your BMI and help you achieve or maintain a healthy weight.  For females 20 years of age and older: ? A BMI below 18.5 is considered underweight. ? A BMI of 18.5 to 24.9 is normal. ? A BMI of 25 to 29.9 is considered overweight. ? A BMI of 30 and above is considered obese. Watch levels of cholesterol and blood lipids  You should start having your blood tested for lipids and cholesterol at 68 years of age, then have this test every 5 years.  You may need to have your cholesterol levels checked more often if: ? Your lipid or cholesterol levels are high. ? You are older than 68 years of age. ? You are at high risk for heart disease. Cancer screening Lung Cancer  Lung cancer screening is recommended for adults 55-80 years old who are at high risk for lung cancer because of a history of smoking.  A yearly low-dose CT scan of the lungs is recommended for people who: ? Currently smoke. ? Have quit within the past 15 years. ? Have at least a 30-pack-year history of smoking. A pack year is smoking an average of one pack of cigarettes a day for 1 year.    Yearly screening should continue until it has been 15 years since you quit.  Yearly screening should stop if you develop a health problem that would prevent you from having lung cancer treatment. Breast Cancer  Practice breast self-awareness. This means understanding how your breasts normally appear and feel.  It also means doing regular breast self-exams. Let your health care provider know about any changes, no matter how small.  If you are in your 20s or 30s, you should have a clinical breast exam (CBE) by a health care  provider every 1-3 years as part of a regular health exam.  If you are 63 or older, have a CBE every year. Also consider having a breast X-ray (mammogram) every year.  If you have a family history of breast cancer, talk to your health care provider about genetic screening.  If you are at high risk for breast cancer, talk to your health care provider about having an MRI and a mammogram every year.  Breast cancer gene (BRCA) assessment is recommended for women who have family members with BRCA-related cancers. BRCA-related cancers include: ? Breast. ? Ovarian. ? Tubal. ? Peritoneal cancers.  Results of the assessment will determine the need for genetic counseling and BRCA1 and BRCA2 testing. Cervical Cancer Your health care provider may recommend that you be screened regularly for cancer of the pelvic organs (ovaries, uterus, and vagina). This screening involves a pelvic examination, including checking for microscopic changes to the surface of your cervix (Pap test). You may be encouraged to have this screening done every 3 years, beginning at age 44.  For women ages 1-65, health care providers may recommend pelvic exams and Pap testing every 3 years, or they may recommend the Pap and pelvic exam, combined with testing for human papilloma virus (HPV), every 5 years. Some types of HPV increase your risk of cervical cancer. Testing for HPV may also be done on women of any age with unclear Pap test results.  Other health care providers may not recommend any screening for nonpregnant women who are considered low risk for pelvic cancer and who do not have symptoms. Ask your health care provider if a screening pelvic exam is right for you.  If you have had past treatment for cervical cancer or a condition that could lead to cancer, you need Pap tests and screening for cancer for at least 20 years after your treatment. If Pap tests have been discontinued, your risk factors (such as having a new sexual  partner) need to be reassessed to determine if screening should resume. Some women have medical problems that increase the chance of getting cervical cancer. In these cases, your health care provider may recommend more frequent screening and Pap tests. Colorectal Cancer  This type of cancer can be detected and often prevented.  Routine colorectal cancer screening usually begins at 68 years of age and continues through 68 years of age.  Your health care provider may recommend screening at an earlier age if you have risk factors for colon cancer.  Your health care provider may also recommend using home test kits to check for hidden blood in the stool.  A small camera at the end of a tube can be used to examine your colon directly (sigmoidoscopy or colonoscopy). This is done to check for the earliest forms of colorectal cancer.  Routine screening usually begins at age 29.  Direct examination of the colon should be repeated every 5-10 years through 68 years of age. However, you may  need to be screened more often if early forms of precancerous polyps or small growths are found. Skin Cancer  Check your skin from head to toe regularly.  Tell your health care provider about any new moles or changes in moles, especially if there is a change in a mole's shape or color.  Also tell your health care provider if you have a mole that is larger than the size of a pencil eraser.  Always use sunscreen. Apply sunscreen liberally and repeatedly throughout the day.  Protect yourself by wearing long sleeves, pants, a wide-brimmed hat, and sunglasses whenever you are outside. Heart disease, diabetes, and high blood pressure  High blood pressure causes heart disease and increases the risk of stroke. High blood pressure is more likely to develop in: ? People who have blood pressure in the high end of the normal range (130-139/85-89 mm Hg). ? People who are overweight or obese. ? People who are African  American.  If you are 18-39 years of age, have your blood pressure checked every 3-5 years. If you are 40 years of age or older, have your blood pressure checked every year. You should have your blood pressure measured twice-once when you are at a hospital or clinic, and once when you are not at a hospital or clinic. Record the average of the two measurements. To check your blood pressure when you are not at a hospital or clinic, you can use: ? An automated blood pressure machine at a pharmacy. ? A home blood pressure monitor.  If you are between 55 years and 79 years old, ask your health care provider if you should take aspirin to prevent strokes.  Have regular diabetes screenings. This involves taking a blood sample to check your fasting blood sugar level. ? If you are at a normal weight and have a low risk for diabetes, have this test once every three years after 68 years of age. ? If you are overweight and have a high risk for diabetes, consider being tested at a younger age or more often. Preventing infection Hepatitis B  If you have a higher risk for hepatitis B, you should be screened for this virus. You are considered at high risk for hepatitis B if: ? You were born in a country where hepatitis B is common. Ask your health care provider which countries are considered high risk. ? Your parents were born in a high-risk country, and you have not been immunized against hepatitis B (hepatitis B vaccine). ? You have HIV or AIDS. ? You use needles to inject street drugs. ? You live with someone who has hepatitis B. ? You have had sex with someone who has hepatitis B. ? You get hemodialysis treatment. ? You take certain medicines for conditions, including cancer, organ transplantation, and autoimmune conditions. Hepatitis C  Blood testing is recommended for: ? Everyone born from 1945 through 1965. ? Anyone with known risk factors for hepatitis C. Sexually transmitted infections  (STIs)  You should be screened for sexually transmitted infections (STIs) including gonorrhea and chlamydia if: ? You are sexually active and are younger than 68 years of age. ? You are older than 68 years of age and your health care provider tells you that you are at risk for this type of infection. ? Your sexual activity has changed since you were last screened and you are at an increased risk for chlamydia or gonorrhea. Ask your health care provider if you are at risk.  If you   do not have HIV, but are at risk, it may be recommended that you take a prescription medicine daily to prevent HIV infection. This is called pre-exposure prophylaxis (PrEP). You are considered at risk if: ? You are sexually active and do not regularly use condoms or know the HIV status of your partner(s). ? You take drugs by injection. ? You are sexually active with a partner who has HIV. Talk with your health care provider about whether you are at high risk of being infected with HIV. If you choose to begin PrEP, you should first be tested for HIV. You should then be tested every 3 months for as long as you are taking PrEP. Pregnancy  If you are premenopausal and you may become pregnant, ask your health care provider about preconception counseling.  If you may become pregnant, take 400 to 800 micrograms (mcg) of folic acid every day.  If you want to prevent pregnancy, talk to your health care provider about birth control (contraception). Osteoporosis and menopause  Osteoporosis is a disease in which the bones lose minerals and strength with aging. This can result in serious bone fractures. Your risk for osteoporosis can be identified using a bone density scan.  If you are 66 years of age or older, or if you are at risk for osteoporosis and fractures, ask your health care provider if you should be screened.  Ask your health care provider whether you should take a calcium or vitamin D supplement to lower your risk  for osteoporosis.  Menopause may have certain physical symptoms and risks.  Hormone replacement therapy may reduce some of these symptoms and risks. Talk to your health care provider about whether hormone replacement therapy is right for you. Follow these instructions at home:  Schedule regular health, dental, and eye exams.  Stay current with your immunizations.  Do not use any tobacco products including cigarettes, chewing tobacco, or electronic cigarettes.  If you are pregnant, do not drink alcohol.  If you are breastfeeding, limit how much and how often you drink alcohol.  Limit alcohol intake to no more than 1 drink per day for nonpregnant women. One drink equals 12 ounces of beer, 5 ounces of wine, or 1 ounces of hard liquor.  Do not use street drugs.  Do not share needles.  Ask your health care provider for help if you need support or information about quitting drugs.  Tell your health care provider if you often feel depressed.  Tell your health care provider if you have ever been abused or do not feel safe at home. This information is not intended to replace advice given to you by your health care provider. Make sure you discuss any questions you have with your health care provider. Document Released: 07/08/2010 Document Revised: 05/31/2015 Document Reviewed: 09/26/2014 Elsevier Interactive Patient Education  2019 [place].  Acute Knee Pain, Adult Many things can cause knee pain. Sometimes, knee pain is sudden (acute) and may be caused by damage, swelling, or irritation of the muscles and tissues that support your knee. The pain often goes away on its own with time and rest. If the pain does not go away, tests may be done to find out what is causing the pain. Follow these instructions at home: Pay attention to any changes in your symptoms. Take these actions to relieve your pain. If you have a knee sleeve or brace:   Wear the sleeve or brace as told by your  doctor. Remove it only as  told by your doctor.  Loosen the sleeve or brace if your toes: ? Tingle. ? Become numb. ? Turn cold and blue.  Keep the sleeve or brace clean.  If the sleeve or brace is not waterproof: ? Do not let it get wet. ? Cover it with a watertight covering when you take a bath or shower. Activity  Rest your knee.  Do not do things that cause pain.  Avoid activities where both feet leave the ground at the same time (high-impact activities). Examples are running, jumping rope, and doing jumping jacks.  Work with a physical therapist to make a safe exercise program, as told by your doctor. Managing pain, stiffness, and swelling   If told, put ice on the knee: ? Put ice in a plastic bag. ? Place a towel between your skin and the bag. ? Leave the ice on for 20 minutes, 2-3 times a day.  If told, put pressure (compression) on your injured knee to control swelling, give support, and help with discomfort. Compression may be done with an elastic bandage. General instructions  Take all medicines only as told by your doctor.  Raise (elevate) your knee while you are sitting or lying down. Make sure your knee is higher than your heart.  Sleep with a pillow under your knee.  Do not use any products that contain nicotine or tobacco. These include cigarettes, e-cigarettes, and chewing tobacco. These products may slow down healing. If you need help quitting, ask your doctor.  If you are overweight, work with your doctor and a food expert (dietitian) to set goals to lose weight. Being overweight can make your knee hurt more.  Keep all follow-up visits as told by your doctor. This is important. Contact a doctor if:  The knee pain does not stop.  The knee pain changes or gets worse.  You have a fever along with knee pain.  Your knee feels warm when you touch it.  Your knee gives out or locks up. Get help right away if:  Your knee swells, and the swelling gets  worse.  You cannot move your knee.  You have very bad knee pain. Summary  Many things can cause knee pain. The pain often goes away on its own with time and rest.  Your doctor may do tests to find out the cause of the pain.  Pay attention to any changes in your symptoms. Relieve your pain with rest, medicines, light activity, and use of ice.  Get help right away if you cannot move your knee or your knee pain is very bad. This information is not intended to replace advice given to you by your health care provider. Make sure you discuss any questions you have with your health care provider. Document Released: 03/21/2008 Document Revised: 06/04/2017 Document Reviewed: 06/04/2017 Elsevier Interactive Patient Education  2019 [employer].

## 2018-02-03 ENCOUNTER — Telehealth: Payer: Self-pay

## 2018-02-03 LAB — GLUCOSE, RANDOM: Glucose: 113 mg/dL — ABNORMAL HIGH (ref 65–99)

## 2018-02-03 LAB — LIPID PANEL WITH LDL/HDL RATIO
CHOLESTEROL TOTAL: 303 mg/dL — AB (ref 100–199)
HDL: 49 mg/dL (ref >39)
LDL CALC: 213 mg/dL — AB (ref 0–99)
LDL/HDL RATIO: 4.3 ratio — AB (ref 0.0–3.2)
TRIGLYCERIDES: 206 mg/dL — AB (ref 0–149)
VLDL Cholesterol Cal: 41 mg/dL — ABNORMAL HIGH (ref 5–40)

## 2018-02-03 NOTE — Progress Notes (Signed)
Please let patient know that she continues to have elevated cholesterol - Total cholesterol is his at 303 this is increased from 236 over 6 months ago. Triglycerides are 206 this is decreased from 234 over 6 months ago.  LDL bad cholesterol is 213 and was lower at 149 six months ago. Gl;ucose is elevated at 113 and recommend Hemoglobin A1C if she has not had one with her primary care.  This puts patient at increased risk for stroke and heart disease. Please let her know to follow up with her primary care physician to discuss medical management  within 2 weeks and try to follow a low cholesterol, low fat, decreased sugary foods and a heart healthy diet.

## 2018-02-03 NOTE — Telephone Encounter (Signed)
Reviewed labs that were drawn at outside facility.  Needs a f/u appt with me to discuss.

## 2018-02-03 NOTE — Telephone Encounter (Signed)
Contacted the patient with her lab results from her Biometric Physical. She was notified that her Cholesterol was still elevated along with her fasting glucose. She was advised to try a low fat, low sugar diet and to F/U with her PCP with in 2 weeks for further evaluation and dietary needs/plan management. She gave verbal understanding.

## 2018-02-04 NOTE — Telephone Encounter (Signed)
She has an appointment with you on 2/4.

## 2018-02-09 ENCOUNTER — Ambulatory Visit: Payer: Managed Care, Other (non HMO) | Admitting: Internal Medicine

## 2018-02-09 ENCOUNTER — Ambulatory Visit: Payer: Self-pay | Admitting: Internal Medicine

## 2018-02-09 ENCOUNTER — Encounter: Payer: Self-pay | Admitting: Internal Medicine

## 2018-02-09 VITALS — BP 128/76 | HR 66 | Temp 98.2°F | Resp 16 | Wt 177.4 lb

## 2018-02-09 DIAGNOSIS — K219 Gastro-esophageal reflux disease without esophagitis: Secondary | ICD-10-CM | POA: Diagnosis not present

## 2018-02-09 DIAGNOSIS — E7800 Pure hypercholesterolemia, unspecified: Secondary | ICD-10-CM | POA: Diagnosis not present

## 2018-02-09 DIAGNOSIS — R079 Chest pain, unspecified: Secondary | ICD-10-CM

## 2018-02-09 DIAGNOSIS — R7989 Other specified abnormal findings of blood chemistry: Secondary | ICD-10-CM

## 2018-02-09 DIAGNOSIS — R945 Abnormal results of liver function studies: Secondary | ICD-10-CM

## 2018-02-09 DIAGNOSIS — F439 Reaction to severe stress, unspecified: Secondary | ICD-10-CM

## 2018-02-09 DIAGNOSIS — R739 Hyperglycemia, unspecified: Secondary | ICD-10-CM

## 2018-02-09 DIAGNOSIS — Z6831 Body mass index (BMI) 31.0-31.9, adult: Secondary | ICD-10-CM

## 2018-02-09 DIAGNOSIS — D869 Sarcoidosis, unspecified: Secondary | ICD-10-CM | POA: Diagnosis not present

## 2018-02-09 MED ORDER — ROSUVASTATIN CALCIUM 10 MG PO TABS: 10.0000 mg | ORAL_TABLET | Freq: Every day | ORAL | 2 refills | Status: DC
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
## 2018-02-14 ENCOUNTER — Encounter: Payer: Self-pay | Admitting: Internal Medicine

## 2018-02-14 ENCOUNTER — Other Ambulatory Visit: Payer: Self-pay | Admitting: Internal Medicine

## 2018-02-14 NOTE — Assessment & Plan Note (Signed)
Has been followed by pulmonary.  Stable.   

## 2018-02-14 NOTE — Assessment & Plan Note (Signed)
Increased stress.  Continue zoloft.  Follow.  ?

## 2018-02-14 NOTE — Assessment & Plan Note (Signed)
Discussed diet and exercise.  Follow.  

## 2018-02-14 NOTE — Assessment & Plan Note (Addendum)
Low cholesterol diet and exercise.  Discussed calculated cholesterol risk.  Agreed to start crestor.  Follow lipid panel.

## 2018-02-14 NOTE — Assessment & Plan Note (Signed)
Controlled on current regimen.  Follow.  

## 2018-02-14 NOTE — Assessment & Plan Note (Signed)
Low carb diet and exercise.  Follow met b and a1c.  

## 2018-02-14 NOTE — Assessment & Plan Note (Signed)
Discussed diet and exercise. Follow liver function tests.   

## 2018-02-22 ENCOUNTER — Encounter: Payer: Self-pay | Admitting: Internal Medicine

## 2018-02-22 DIAGNOSIS — M25569 Pain in unspecified knee: Secondary | ICD-10-CM

## 2018-02-22 NOTE — Telephone Encounter (Signed)
Pt needing ortho referral. I can let her know that we will follow up. Was seen on 2/4

## 2018-02-22 NOTE — Telephone Encounter (Signed)
Ortho referral placed

## 2018-03-15 DIAGNOSIS — M6751 Plica syndrome, right knee: Secondary | ICD-10-CM | POA: Insufficient documentation

## 2018-04-26 ENCOUNTER — Ambulatory Visit: Payer: Self-pay | Admitting: Internal Medicine

## 2018-04-30 ENCOUNTER — Other Ambulatory Visit: Payer: Self-pay | Admitting: Internal Medicine

## 2018-05-28 ENCOUNTER — Other Ambulatory Visit: Payer: Self-pay | Admitting: Internal Medicine

## 2018-06-21 ENCOUNTER — Encounter: Payer: Self-pay | Admitting: Adult Health

## 2018-06-21 ENCOUNTER — Ambulatory Visit: Payer: Medicare Other | Admitting: Adult Health

## 2018-06-21 ENCOUNTER — Other Ambulatory Visit: Payer: Self-pay

## 2018-06-21 DIAGNOSIS — L2389 Allergic contact dermatitis due to other agents: Secondary | ICD-10-CM

## 2018-06-21 MED ORDER — BETAMETHASONE DIPROPIONATE 0.05 % EX CREA: TOPICAL_CREAM | Freq: Two times a day (BID) | CUTANEOUS | 0 refills | Status: DC

## 2018-06-21 MED ORDER — CETIRIZINE HCL 10 MG PO TABS: 10.0000 mg | ORAL_TABLET | Freq: Every day | ORAL | 0 refills | Status: DC

## 2018-06-21 MED ORDER — PREDNISONE 10 MG (21) PO TBPK: ORAL_TABLET | ORAL | 0 refills | Status: DC

## 2018-06-21 NOTE — Patient Instructions (Signed)
Cetirizine chewable tablets What is this medicine? CETIRIZINE (se TI ra zeen) is an antihistamine. This medicine is used to treat or prevent symptoms of allergies. It is also used to help reduce itchy skin rash and hives. This medicine may be used for other purposes; ask your health care provider or pharmacist if you have questions. COMMON BRAND NAME(S): All Day Allergy Children's, Zyrtec, Zyrtec Children's What should I tell my health care provider before I take this medicine? They need to know if you have any of these conditions: -liver disease -kidney disease -an unusual or allergic reaction to cetirizine, hydroxyzine, other medicines, foods, dyes, or preservatives -pregnant or trying to get pregnant -breast-feeding How should I use this medicine? Take this medicine by mouth with a glass of water. Chew it completely before swallowing. Follow the directions on the prescription label. You can take it with or without food. Do not take more medicine than directed. You may need to take this medicine for several days before your symptoms improve. Talk to your pediatrician regarding the use of this medicine in children. While this drug may be prescribed for selected conditions, precautions do apply. Overdosage: If you think you have taken too much of this medicine contact a poison control center or emergency room at once. NOTE: This medicine is only for you. Do not share this medicine with others. What if I miss a dose? If you miss a dose, take it as soon as you can. If it is almost time for your next dose, take only that dose. Do not take double or extra doses. What may interact with this medicine? -alcohol -certain medicines for anxiety or sleep -narcotic medicines for pain -other medicines for colds or allergies This list may not describe all possible interactions. Give your health care provider a list of all the medicines, herbs, non-prescription drugs, or dietary supplements you use. Also  tell them if you smoke, drink alcohol, or use illegal drugs. Some items may interact with your medicine. What should I watch for while using this medicine? Visit your doctor or health care professional for regular checks on your health. Tell your doctor if your symptoms do not improve. You may get drowsy or dizzy. Do not drive, use machinery, or do anything that needs mental alertness until you know how this medicine affects you. Do not stand or sit up quickly, especially if you are an older patient. This reduces the risk of dizzy or fainting spells. Your mouth may get dry. Chewing sugarless gum or sucking hard candy, and drinking plenty of water may help. Contact your doctor if the problem does not go away or is severe. What side effects may I notice from receiving this medicine? Side effects that you should report to your doctor or health care professional as soon as possible: -allergic reactions like skin rash, itching or hives, swelling of the face, lips, or tongue -changes in vision or hearing -fast or irregular heartbeat -trouble passing urine or change in the amount of urine Side effects that usually do not require medical attention (report to your doctor or health care professional if they continue or are bothersome): -dizziness -dry mouth -irritability -sore throat -stomach pain -tiredness This list may not describe all possible side effects. Call your doctor for medical advice about side effects. You may report side effects to FDA at 1-800-FDA-1088. Where should I keep my medicine? Keep out of the reach of children. Store at room temperature between 15 and 30 degrees C (59 and 86 degrees  F). Throw away any unused medicine after the expiration date. NOTE: This sheet is a summary. It may not cover all possible information. If you have questions about this medicine, talk to your doctor, pharmacist, or health care provider.  2019 Elsevier/Gold Standard (2013-09-13  22:07:39) Betamethasone skin cream, gel, lotion, or ointment What is this medicine? BETAMETHASONE (bay ta METH a sone) is a corticosteroid. It is used on the skin to treat itching, redness, and swelling caused by some skin conditions. This medicine may be used for other purposes; ask your health care provider or pharmacist if you have questions. COMMON BRAND NAME(S): Alphatrex, Beta Derm, Beta-Val, Betanate, Betatrex, Del-Beta, Diprolene, Diprolene AF, Diprosone, Maxivate, RRB Pak, Valisone What should I tell my health care provider before I take this medicine? They need to know if you have any of these conditions: -acne or rosacea -any type of active infection -diabetes -eye disease, vision problems -glaucoma or cataracts -large areas of burned or damaged skin -skin wasting or thinning -an unusual or allergic reaction to betamethasone, corticosteroids, other medicines, foods, dyes, or preservatives -pregnant or trying to get pregnant -breast-feeding How should I use this medicine? This medicine is for external use only. Do not take by mouth. Follow the directions on the prescription label. Wash your hands before and after use. Apply a thin film of medicine to the affected area. Do not cover with a bandage or dressing unless your doctor or health care professional tells you to. Do not use on healthy skin or over large areas of skin. Do not get this medicine in your eyes. If you do, rinse out with plenty of cool tap water. It is important not to use more medicine than prescribed. Do not use your medicine more often than directed. Do not use for more than 14 days. Talk to your pediatrician regarding the use of this medicine in children. Special care may be needed. While this drug may be prescribed for children as young as 83 years of age for selected conditions, precautions do apply. If applying this medicine to the diaper area of a child, do not cover with tight-fitting diapers or plastic pants.  This may increase the amount of medicine that passes through the skin and increase the risk of serious side effects. Elderly patients are more likely to have damaged skin through aging, and this may increase side effects. This medicine should only be used for brief periods and infrequently in older patients. Overdosage: If you think you have taken too much of this medicine contact a poison control center or emergency room at once. NOTE: This medicine is only for you. Do not share this medicine with others. What if I miss a dose? If you miss a dose, use it as soon as you can. If it is almost time for your next dose, use only that dose. Do not use double or extra doses. What may interact with this medicine? Interactions are not expected. Do not use any other skin products without telling your doctor or health care professional. This list may not describe all possible interactions. Give your health care provider a list of all the medicines, herbs, non-prescription drugs, or dietary supplements you use. Also tell them if you smoke, drink alcohol, or use illegal drugs. Some items may interact with your medicine. What should I watch for while using this medicine? Tell your doctor or health care professional if your symptoms do not improve within one week. Tell your doctor or health care professional if you are  exposed to anyone with measles or chickenpox, or if you develop sores or blisters that do not heal properly. What side effects may I notice from receiving this medicine? Side effects that you should report to your doctor or health care professional as soon as possible: -allergic reactions like skin rash, itching or hives, swelling of the face, lips, or tongue -burning or itching of the skin that does not go away -dark red spots on the skin -infection -lack of healing of skin condition -painful, red, pus filled blisters in hair follicles -thinning of the skin, with easy bruising Side effects that  usually do not require medical attention (report to your doctor or health care professional if they continue or are bothersome): -dry skin -increased redness or scaling of the skin -mild burning, itching, or irritation of the skin This list may not describe all possible side effects. Call your doctor for medical advice about side effects. You may report side effects to FDA at 1-800-FDA-1088. Where should I keep my medicine? Keep out of the reach of children. Store at room temperature between 2 and 30 degrees C (36 and 86 degrees F). Do not freeze. Throw away any unused medicine after the expiration date. NOTE: This sheet is a summary. It may not cover all possible information. If you have questions about this medicine, talk to your doctor, pharmacist, or health care provider.  2019 Elsevier/Gold Standard (2017-05-13 15:28:00) Prednisolone tablets What is this medicine? PREDNISOLONE (pred NISS oh lone) is a corticosteroid. It is commonly used to treat inflammation of the skin, joints, lungs, and other organs. Common conditions treated include asthma, allergies, and arthritis. It is also used for other conditions, such as blood disorders and diseases of the adrenal glands. This medicine may be used for other purposes; ask your health care provider or pharmacist if you have questions. COMMON BRAND NAME(S): Millipred, Millipred DP, Millipred DP 12-Day, Millipred DP 6 Day, Prednoral What should I tell my health care provider before I take this medicine? They need to know if you have any of these conditions: -Cushing's syndrome -diabetes -glaucoma -heart problems or disease -high blood pressure -infection such as herpes, measles, tuberculosis, or chickenpox -kidney disease -liver disease -mental problems -myasthenia gravis -osteoporosis -seizures -stomach ulcer or intestine disease including colitis and diverticulitis -thyroid problem -an unusual or allergic reaction to lactose,  prednisolone, other medicines, foods, dyes, or preservatives -pregnant or trying to get pregnant -breast-feeding How should I use this medicine? Take this medicine by mouth with a glass of water. Follow the directions on the prescription label. Take it with food or milk to avoid stomach upset. If you are taking this medicine once a day, take it in the morning. Do not take more medicine than you are told to take. Do not suddenly stop taking your medicine because you may develop a severe reaction. Your doctor will tell you how much medicine to take. If your doctor wants you to stop the medicine, the dose may be slowly lowered over time to avoid any side effects. Talk to your pediatrician regarding the use of this medicine in children. Special care may be needed. Overdosage: If you think you have taken too much of this medicine contact a poison control center or emergency room at once. NOTE: This medicine is only for you. Do not share this medicine with others. What if I miss a dose? If you miss a dose, take it as soon as you can. If it is almost time for your next dose,  take only that dose. Do not take double or extra doses. What may interact with this medicine? Do not take this medicine with any of the following medications: -metyrapone -mifepristone This medicine may also interact with the following medications: -aminoglutethimide -amphotericin B -aspirin and aspirin-like medicines -barbiturates -certain medicines for diabetes, like glipizide or glyburide -cholestyramine -cholinesterase inhibitors -cyclosporine -digoxin -diuretics -ephedrine -female hormones, like estrogens and birth control pills -isoniazid -ketoconazole -NSAIDS, medicines for pain and inflammation, like ibuprofen or naproxen -phenytoin -rifampin -toxoids -vaccines -warfarin This list may not describe all possible interactions. Give your health care provider a list of all the medicines, herbs, non-prescription  drugs, or dietary supplements you use. Also tell them if you smoke, drink alcohol, or use illegal drugs. Some items may interact with your medicine. What should I watch for while using this medicine? Visit your doctor or health care professional for regular checks on your progress. If you are taking this medicine over a prolonged period, carry an identification card with your name and address, the type and dose of your medicine, and your doctor's name and address. This medicine may increase your risk of getting an infection. Tell your doctor or health care professional if you are around anyone with measles or chickenpox, or if you develop sores or blisters that do not heal properly. If you are going to have surgery, tell your doctor or health care professional that you have taken this medicine within the last twelve months. Ask your doctor or health care professional about your diet. You may need to lower the amount of salt you eat. This medicine may affect blood sugar levels. If you have diabetes, check with your doctor or health care professional before you change your diet or the dose of your diabetic medicine. What side effects may I notice from receiving this medicine? Side effects that you should report to your doctor or health care professional as soon as possible: -allergic reactions like skin rash, itching or hives, swelling of the face, lips, or tongue -changes in emotions or moods -changes in vision -eye pain -signs and symptoms of high blood sugar such as dizziness; dry mouth; dry skin; fruity breath; nausea; stomach pain; increased hunger or thirst; increased urination -signs and symptoms of infection like fever or chills; cough; sore throat; pain or trouble passing urine -slow growth in children (if used for longer periods of time) -swelling of ankles, feet -trouble sleeping -unusually weak or tired -weak bones (if used for longer periods of time) Side effects that usually do not  require medical attention (report to your doctor or health care professional if they continue or are bothersome): -increased hunger -nausea -skin problems, acne, thin and shiny skin -upset stomach -weight gain This list may not describe all possible side effects. Call your doctor for medical advice about side effects. You may report side effects to FDA at 1-800-FDA-1088. Where should I keep my medicine? Keep out of the reach of children. Store at room temperature between 15 and 30 degrees C (59 and 86 degrees F). Keep container tightly closed. Throw away any unused medicine after the expiration date. NOTE: This sheet is a summary. It may not cover all possible information. If you have questions about this medicine, talk to your doctor, pharmacist, or health care provider.  2019 Elsevier/Gold Standard (2015-01-25 12:30:30) Poison Ivy Dermatitis  Poison ivy dermatitis is redness and soreness (inflammation) of the skin. It is caused by a chemical that is found on the leaves of the poison ivy plant.  You may also have itching, a rash, and blisters. Symptoms often clear up in 1-2 weeks. You may get this condition by touching a poison ivy plant. You can also get it by touching something that has the chemical on it. This may include animals or objects that have come in contact with the plant. Follow these instructions at home: General instructions  Take or apply over-the-counter and prescription medicines only as told by your doctor.  If you touch poison ivy, wash your skin with soap and cold water right away.  Use hydrocortisone creams or calamine lotion as needed to help with itching.  Take oatmeal baths as needed. Use colloidal oatmeal. You can get this at a pharmacy or grocery store. Follow the instructions on the package.  Do not scratch or rub your skin.  While you have the rash, wash your clothes right after you wear them. Prevention   Know what poison ivy looks like so you can avoid  it. This plant has three leaves with flowering branches on a single stem. The leaves are glossy. They have uneven edges that come to a point at the front.  If you have touched poison ivy, wash with soap and water right away. Be sure to wash under your fingernails.  When hiking or camping, wear long pants, a long-sleeved shirt, tall socks, and hiking boots. You can also use a lotion on your skin that helps to prevent contact with the chemical on the plant.  If you think that your clothes or outdoor gear came in contact with poison ivy, rinse them off with a garden hose before you bring them inside your house. Contact a doctor if:  You have open sores in the rash area.  You have more redness, swelling, or pain in the affected area.  You have redness that spreads beyond the rash area.  You have fluid, blood, or pus coming from the affected area.  You have a fever.  You have a rash over a large area of your body.  You have a rash on your eyes, mouth, or genitals.  Your rash does not get better after a few days. Get help right away if:  Your face swells or your eyes swell shut.  You have trouble breathing.  You have trouble swallowing. This information is not intended to replace advice given to you by your health care provider. Make sure you discuss any questions you have with your health care provider. Document Released: 01/25/2010 Document Revised: 09/16/2017 Document Reviewed: 05/31/2014 Elsevier Interactive Patient Education  2019 [employer].

## 2018-06-21 NOTE — Progress Notes (Signed)
Virtual Visit via Telephone Note  I connected with Christy Escobar on 06/21/18 at  9:30 AM EDT by telephone and verified that I am speaking with the correct person using two identifiers.  Allergies  Allergen Reactions  . Clarithromycin Other (See Comments) and Nausea Only    Nausea, GI burning  GI burning  Nausea, GI burning    Location: Patient: at her office  Provider: [facility], [place], [place]    I discussed the limitations, risks, security and privacy concerns of performing an evaluation and management service by telephone and the availability of in person appointments. I also discussed with the patient that there may be a patient responsible charge related to this service. The patient expressed understanding and agreed to proceed.   History of Present Illness: Patient is a 68 year old female in no acute distress who calls the clinic for a telephone visit. She has a pruritic rash to her face, Hands,Legs and arms.  Denies any eye involvement or near eye. Onset 5 days ago.  She reports the symptoms started last Thursday morning.  She was working outside and got into some bushes that had poison oak wrapped in them. Denies weeping.  Tried over the counter hydrocortisone cream with mild improvement of her symptoms. He denies any oral inhalation or ingestion.   Patient  denies any fever, body aches,chills, rash, chest pain, shortness of breath, nausea, vomiting, or diarrhea.   Patient reports that she has had poison ivy before and this is consistent with what she had in the past and she is definite that she became in contact with it.  She reports she has used steroid in the past for it and had no issues. Denies any recent use of antibiotics or steroids.  Observations/Objective: No vitals are currently available.  Patient reports that she is afebrile.   Patient is alert and oriented and responsive to questions Engages in conversation with  provider. Speaks in full sentences without any pauses without any shortness of breath or distress.   Rash pictures attached consistent with contact dermatitis- poison oak.   Most recent labs reviewed/ kidney and liver function normal Assessment and Plan:  Christy Escobar was seen today for telephone visit - rash.  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 Apply to areas of concern sparingly avoiding  face and genitals. -     predniSONE (STERAPRED UNI-PAK 21 TAB) 10 MG (21) TBPK tablet; PO: Take 6 tablets on day 1:Take 5 tablets day 2:Take 4 tablets day 3: Take 3 tablets day 4:Take 2 tablets day five: 5 Take 1 tablet day 6 -     cetirizine (ZYRTEC) 10 MG tablet; Take 1 tablet (10 mg total) by mouth daily. Will help with itching    Follow Up Instructions:   Discussed RED FLAGS and when to seek emergency treatment.   Advised patient call the office or your primary care doctor for an appointment if no improvement within 72 hours or if any symptoms change or worsen at any time  Advised ER or urgent Care if after hours or on weekend. Call 911 for emergency symptoms at any time.Patinet verbalized understanding of all instructions given/reviewed and treatment plan and has no further questions or concerns at this time.   Patient information also sent to my chart message,  patient is aware to review there and call the clinic should she have any issues  I discussed the assessment and treatment plan with the patient. The patient was provided an opportunity to ask questions and all were answered. The patient agreed with the plan and demonstrated an understanding of the instructions.   The patient was advised to call back or seek an in-person evaluation if the symptoms worsen or if the condition fails to improve as anticipated.  I provided 15   minutes of non-face-to-face time during this encounter.   Marcille Buffy, FNP

## 2018-07-21 ENCOUNTER — Other Ambulatory Visit: Payer: Self-pay | Admitting: Internal Medicine

## 2018-08-03 ENCOUNTER — Other Ambulatory Visit: Payer: Self-pay | Admitting: Internal Medicine

## 2018-08-03 ENCOUNTER — Encounter: Payer: Self-pay | Admitting: Internal Medicine

## 2018-08-04 MED ORDER — ZOLMITRIPTAN 5 MG PO TABS: ORAL_TABLET | ORAL | 0 refills | Status: DC

## 2018-08-10 ENCOUNTER — Other Ambulatory Visit: Payer: Managed Care, Other (non HMO)

## 2018-08-10 ENCOUNTER — Telehealth: Payer: Self-pay | Admitting: Internal Medicine

## 2018-08-10 DIAGNOSIS — E7800 Pure hypercholesterolemia, unspecified: Secondary | ICD-10-CM

## 2018-08-10 DIAGNOSIS — R739 Hyperglycemia, unspecified: Secondary | ICD-10-CM

## 2018-08-10 NOTE — Telephone Encounter (Signed)
Orders placed for labs

## 2018-08-16 ENCOUNTER — Other Ambulatory Visit: Payer: Self-pay

## 2018-08-16 ENCOUNTER — Other Ambulatory Visit: Payer: Self-pay | Admitting: Internal Medicine

## 2018-08-16 ENCOUNTER — Other Ambulatory Visit: Payer: Managed Care, Other (non HMO)

## 2018-08-16 DIAGNOSIS — E7800 Pure hypercholesterolemia, unspecified: Secondary | ICD-10-CM

## 2018-08-16 DIAGNOSIS — R739 Hyperglycemia, unspecified: Secondary | ICD-10-CM

## 2018-08-17 ENCOUNTER — Encounter: Payer: Self-pay | Admitting: Internal Medicine

## 2018-08-17 LAB — CBC WITH DIFFERENTIAL/PLATELET
Basophils Absolute: 0.1 10*3/uL (ref 0.0–0.2)
Basos: 1 %
EOS (ABSOLUTE): 0.1 10*3/uL (ref 0.0–0.4)
Eos: 2 %
Hematocrit: 39.5 % (ref 34.0–46.6)
Hemoglobin: 13.5 g/dL (ref 11.1–15.9)
Immature Grans (Abs): 0 10*3/uL (ref 0.0–0.1)
Immature Granulocytes: 0 %
Lymphocytes Absolute: 2 10*3/uL (ref 0.7–3.1)
Lymphs: 33 %
MCH: 30.2 pg (ref 26.6–33.0)
MCHC: 34.2 g/dL (ref 31.5–35.7)
MCV: 88 fL (ref 79–97)
Monocytes Absolute: 0.5 10*3/uL (ref 0.1–0.9)
Monocytes: 8 %
Neutrophils Absolute: 3.3 10*3/uL (ref 1.4–7.0)
Neutrophils: 56 %
Platelets: 230 10*3/uL (ref 150–450)
RBC: 4.47 x10E6/uL (ref 3.77–5.28)
RDW: 12.6 % (ref 11.7–15.4)
WBC: 5.9 10*3/uL (ref 3.4–10.8)

## 2018-08-17 LAB — LIPID PANEL
Chol/HDL Ratio: 3.9 ratio (ref 0.0–4.4)
Cholesterol, Total: 185 mg/dL (ref 100–199)
HDL: 47 mg/dL (ref >39)
LDL Calculated: 106 mg/dL — ABNORMAL HIGH (ref 0–99)
Triglycerides: 161 mg/dL — ABNORMAL HIGH (ref 0–149)
VLDL Cholesterol Cal: 32 mg/dL (ref 5–40)

## 2018-08-17 LAB — BASIC METABOLIC PANEL
BUN/Creatinine Ratio: 30 — ABNORMAL HIGH (ref 12–28)
BUN: 21 mg/dL (ref 8–27)
CO2: 22 mmol/L (ref 20–29)
Calcium: 9.2 mg/dL (ref 8.7–10.3)
Chloride: 104 mmol/L (ref 96–106)
Creatinine, Ser: 0.71 mg/dL (ref 0.57–1.00)
GFR calc Af Amer: 101 mL/min/{1.73_m2} (ref >59)
GFR calc non Af Amer: 88 mL/min/{1.73_m2} (ref >59)
Glucose: 113 mg/dL — ABNORMAL HIGH (ref 65–99)
Potassium: 4.2 mmol/L (ref 3.5–5.2)
Sodium: 141 mmol/L (ref 134–144)

## 2018-08-17 LAB — HEPATIC FUNCTION PANEL
ALT: 21 IU/L (ref 0–32)
AST: 23 IU/L (ref 0–40)
Albumin: 4.3 g/dL (ref 3.8–4.8)
Alkaline Phosphatase: 51 IU/L (ref 39–117)
Bilirubin Total: 0.3 mg/dL (ref 0.0–1.2)
Bilirubin, Direct: 0.09 mg/dL (ref 0.00–0.40)
Total Protein: 6.7 g/dL (ref 6.0–8.5)

## 2018-08-17 LAB — TSH: TSH: 3.5 u[IU]/mL (ref 0.450–4.500)

## 2018-08-17 LAB — HGB A1C W/O EAG: Hgb A1c MFr Bld: 5.9 % — ABNORMAL HIGH (ref 4.8–5.6)

## 2018-08-19 ENCOUNTER — Other Ambulatory Visit: Payer: Self-pay

## 2018-08-23 ENCOUNTER — Other Ambulatory Visit: Payer: Self-pay

## 2018-08-23 ENCOUNTER — Ambulatory Visit (INDEPENDENT_AMBULATORY_CARE_PROVIDER_SITE_OTHER): Payer: Managed Care, Other (non HMO) | Admitting: Internal Medicine

## 2018-08-23 VITALS — BP 122/72 | HR 75 | Temp 95.6°F | Resp 16 | Ht 63.0 in | Wt 184.6 lb

## 2018-08-23 DIAGNOSIS — R945 Abnormal results of liver function studies: Secondary | ICD-10-CM | POA: Diagnosis not present

## 2018-08-23 DIAGNOSIS — Z860100 Personal history of colon polyps, unspecified: Secondary | ICD-10-CM

## 2018-08-23 DIAGNOSIS — Z1231 Encounter for screening mammogram for malignant neoplasm of breast: Secondary | ICD-10-CM

## 2018-08-23 DIAGNOSIS — Z6832 Body mass index (BMI) 32.0-32.9, adult: Secondary | ICD-10-CM

## 2018-08-23 DIAGNOSIS — Z1239 Encounter for other screening for malignant neoplasm of breast: Secondary | ICD-10-CM | POA: Diagnosis not present

## 2018-08-23 DIAGNOSIS — D869 Sarcoidosis, unspecified: Secondary | ICD-10-CM

## 2018-08-23 DIAGNOSIS — L659 Nonscarring hair loss, unspecified: Secondary | ICD-10-CM

## 2018-08-23 DIAGNOSIS — E7800 Pure hypercholesterolemia, unspecified: Secondary | ICD-10-CM

## 2018-08-23 DIAGNOSIS — R0609 Other forms of dyspnea: Secondary | ICD-10-CM

## 2018-08-23 DIAGNOSIS — F439 Reaction to severe stress, unspecified: Secondary | ICD-10-CM

## 2018-08-23 DIAGNOSIS — R7989 Other specified abnormal findings of blood chemistry: Secondary | ICD-10-CM

## 2018-08-23 DIAGNOSIS — K219 Gastro-esophageal reflux disease without esophagitis: Secondary | ICD-10-CM

## 2018-08-23 DIAGNOSIS — R739 Hyperglycemia, unspecified: Secondary | ICD-10-CM
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
## 2018-08-28 ENCOUNTER — Encounter: Payer: Self-pay | Admitting: Internal Medicine

## 2018-08-28 DIAGNOSIS — R0609 Other forms of dyspnea: Secondary | ICD-10-CM | POA: Insufficient documentation

## 2018-08-28 NOTE — Assessment & Plan Note (Signed)
Has seen dermatology.  Labs unrevealing.  Discussed referral back to dermatology. She wants to hold on referral.

## 2018-08-28 NOTE — Assessment & Plan Note (Signed)
Diet and exercise.  Follow.  

## 2018-08-28 NOTE — Assessment & Plan Note (Signed)
Low carb diet and exercise.  Follow met b and a1c.  

## 2018-08-28 NOTE — Assessment & Plan Note (Signed)
No acid reflux reported.   

## 2018-08-28 NOTE — Assessment & Plan Note (Signed)
SOB with exertion.  Discussed with her today.  EKG - previous visit.  Discussed further evaluation. Request referral to cardiology.  Evaluate with question of need for further w/up (including calcium score, etc).  Follow.  Declines f/u EKG

## 2018-08-28 NOTE — Assessment & Plan Note (Signed)
Followed by pulmonary.  Stable.

## 2018-08-28 NOTE — Assessment & Plan Note (Signed)
Diet and exercise.  Follow liver function tests.   

## 2018-08-28 NOTE — Assessment & Plan Note (Signed)
On zoloft.  Overall she feels she is doing relatively well.  Follow.  Stable.

## 2018-08-28 NOTE — Assessment & Plan Note (Signed)
On crestor.  Low cholesterol diet and exercise.  Follow lipid panel and liver function tests.   

## 2018-08-28 NOTE — Assessment & Plan Note (Signed)
Last colonoscopy 12/2014.  Recommended f/u colonoscopy in 5 years.

## 2018-09-21 ENCOUNTER — Encounter: Payer: Self-pay | Admitting: Internal Medicine

## 2018-09-22 NOTE — Telephone Encounter (Signed)
LMTCB

## 2018-09-23 ENCOUNTER — Encounter: Payer: Self-pay | Admitting: Internal Medicine

## 2018-09-23 ENCOUNTER — Encounter: Payer: Self-pay | Admitting: Cardiology

## 2018-09-23 ENCOUNTER — Ambulatory Visit (INDEPENDENT_AMBULATORY_CARE_PROVIDER_SITE_OTHER): Payer: Managed Care, Other (non HMO) | Admitting: Cardiology

## 2018-09-23 ENCOUNTER — Other Ambulatory Visit: Payer: Self-pay

## 2018-09-23 VITALS — BP 120/76 | HR 63 | Ht 63.0 in | Wt 180.8 lb

## 2018-09-23 DIAGNOSIS — E782 Mixed hyperlipidemia: Secondary | ICD-10-CM | POA: Diagnosis not present

## 2018-09-23 DIAGNOSIS — Z8249 Family history of ischemic heart disease and other diseases of the circulatory system: Secondary | ICD-10-CM | POA: Diagnosis not present

## 2018-09-23 NOTE — Patient Instructions (Signed)
Medication Instructions:  - Your physician recommends that you continue on your current medications as directed. Please refer to the Current Medication list given to you today.  If you need a refill on your cardiac medications before your next appointment, please call your pharmacy.   Lab work: - none ordered  If you have labs (blood work) drawn today and your tests are completely normal, you will receive your results only by: [place] MyChart Message (if you have MyChart) OR . A paper copy in the mail If you have any lab test that is abnormal or we need to change your treatment, we will call you to review the results.  Testing/Procedures: - Your physician has recommended that you have a CT Calcium Score done - this will be $150 out of pocket cost for you  - no special instructions prior to the test  We will call you to schedule.   Follow-Up: At [facility], you and your health needs are our priority.  As part of our continuing mission to provide you with exceptional heart care, we have created designated Provider Care Teams.  These Care Teams include your primary Cardiologist (physician) and Advanced Practice Providers (APPs -  Physician Assistants and Nurse Practitioners) who all work together to provide you with the care you need, when you need it. . in 4 weeks  Any Other Special Instructions Will Be Listed Below (If Applicable). - N/A

## 2018-09-23 NOTE — Telephone Encounter (Signed)
I do not mind sending in a medication, but I would like for her to come in and leave a urine sample to confirm no infection.  If ok, then can try medication.  Let me know if agreeable.
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
## 2018-09-24 ENCOUNTER — Encounter: Payer: Self-pay | Admitting: Adult Health

## 2018-09-24 ENCOUNTER — Ambulatory Visit: Payer: Managed Care, Other (non HMO) | Admitting: Adult Health

## 2018-09-24 VITALS — BP 130/60 | HR 67 | Temp 96.9°F | Resp 16

## 2018-09-24 DIAGNOSIS — R300 Dysuria: Secondary | ICD-10-CM

## 2018-09-24 DIAGNOSIS — N83202 Unspecified ovarian cyst, left side: Secondary | ICD-10-CM | POA: Insufficient documentation

## 2018-09-24 LAB — POCT URINALYSIS DIPSTICK
Bilirubin, UA: NEGATIVE
Blood, UA: NEGATIVE
Glucose, UA: NEGATIVE
Ketones, UA: NEGATIVE
Leukocytes, UA: NEGATIVE
Nitrite, UA: NEGATIVE
Protein, UA: NEGATIVE
Spec Grav, UA: 1.025 (ref 1.010–1.025)
Urobilinogen, UA: 0.2 E.U./dL
pH, UA: 6.5 (ref 5.0–8.0)
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
## 2018-09-24 NOTE — Patient Instructions (Signed)
Follow up with a medical provider if symptoms change.   Urinary Tract Infection, Adult A urinary tract infection (UTI) is an infection of any part of the urinary tract. The urinary tract includes:  The kidneys.  The ureters.  The bladder.  The urethra. These organs make, store, and get rid of pee (urine) in the body. What are the causes? This is caused by germs (bacteria) in your genital area. These germs grow and cause swelling (inflammation) of your urinary tract. What increases the risk? You are more likely to develop this condition if:  You have a small, thin tube (catheter) to drain pee.  You cannot control when you pee or poop (incontinence).  You are female, and: ? You use these methods to prevent pregnancy: ? A medicine that kills sperm (spermicide). ? A device that blocks sperm (diaphragm). ? You have low levels of a female hormone (estrogen). ? You are pregnant.  You have genes that add to your risk.  You are sexually active.  You take antibiotic medicines.  You have trouble peeing because of: ? A prostate that is bigger than normal, if you are female. ? A blockage in the part of your body that drains pee from the bladder (urethra). ? A kidney stone. ? A nerve condition that affects your bladder (neurogenic bladder). ? Not getting enough to drink. ? Not peeing often enough.  You have other conditions, such as: ? Diabetes. ? A weak disease-fighting system (immune system). ? Sickle cell disease. ? Gout. ? Injury of the spine. What are the signs or symptoms? Symptoms of this condition include:  Needing to pee right away (urgently).  Peeing often.  Peeing small amounts often.  Pain or burning when peeing.  Blood in the pee.  Pee that smells bad or not like normal.  Trouble peeing.  Pee that is cloudy.  Fluid coming from the vagina, if you are female.  Pain in the belly or lower back. Other symptoms include:  Throwing up (vomiting).  No  urge to eat.  Feeling mixed up (confused).  Being tired and grouchy (irritable).  A fever.  Watery poop (diarrhea). How is this treated? This condition may be treated with:  Antibiotic medicine.  Other medicines.  Drinking enough water. Follow these instructions at home:  Medicines  Take over-the-counter and prescription medicines only as told by your doctor.  If you were prescribed an antibiotic medicine, take it as told by your doctor. Do not stop taking it even if you start to feel better. General instructions  Make sure you: ? Pee until your bladder is empty. ? Do not hold pee for a long time. ? Empty your bladder after sex. ? Wipe from front to back after pooping if you are a female. Use each tissue one time when you wipe.  Drink enough fluid to keep your pee pale yellow.  Keep all follow-up visits as told by your doctor. This is important. Contact a doctor if:  You do not get better after 1-2 days.  Your symptoms go away and then come back. Get help right away if:  You have very bad back pain.  You have very bad pain in your lower belly.  You have a fever.  You are sick to your stomach (nauseous).  You are throwing up. Summary  A urinary tract infection (UTI) is an infection of any part of the urinary tract.  This condition is caused by germs in your genital area.  There are many  risk factors for a UTI. These include having a small, thin tube to drain pee and not being able to control when you pee or poop.  Treatment includes antibiotic medicines for germs.  Drink enough fluid to keep your pee pale yellow. This information is not intended to replace advice given to you by your health care provider. Make sure you discuss any questions you have with your health care provider. Document Released: 06/11/2007 Document Revised: 12/10/2017 Document Reviewed: 07/02/2017 Elsevier Patient Education  2020 [employer].

## 2018-09-26 LAB — URINE CULTURE

## 2018-09-28 ENCOUNTER — Encounter: Payer: Self-pay | Admitting: Adult Health

## 2018-09-30 ENCOUNTER — Telehealth: Payer: Self-pay | Admitting: *Deleted

## 2018-09-30 NOTE — Telephone Encounter (Signed)
Called pt 9/21 and 9/24 LMTCB to sch CT

## 2018-10-04 ENCOUNTER — Other Ambulatory Visit: Payer: Self-pay | Admitting: Internal Medicine

## 2018-10-04 ENCOUNTER — Telehealth: Payer: Self-pay | Admitting: Cardiology

## 2018-10-04 NOTE — Telephone Encounter (Signed)
Spoke with the patient. Advised her we are not currently doing CT calcium score in [place] and that [facility] in [place] is the only available location.  Patient sts that she misunderstood and thought the test could be done here in [place]. She ask for the telephone number for [facility] CT to call an schedule. Telephone number provided to the patient.  Patient voiced appreciation for the call.

## 2018-10-04 NOTE — Telephone Encounter (Signed)
Patient calling in with request to schedule CT test in [place]. States she was called by [facility] and does not want to go there. Patient is okay to wait until she can be seen in [place]. Please call to schedule when able

## 2018-10-11 ENCOUNTER — Encounter: Payer: Self-pay | Admitting: Internal Medicine

## 2018-10-13 ENCOUNTER — Ambulatory Visit
Admission: RE | Admit: 2018-10-13 | Discharge: 2018-10-13 | Disposition: A | Discharge status: Home / Self Care | Payer: Managed Care, Other (non HMO) | Source: Ambulatory Visit | Attending: Internal Medicine | Admitting: Internal Medicine

## 2018-10-13 DIAGNOSIS — Z1231 Encounter for screening mammogram for malignant neoplasm of breast: Secondary | ICD-10-CM | POA: Diagnosis not present

## 2018-10-29 ENCOUNTER — Ambulatory Visit: Payer: Managed Care, Other (non HMO) | Admitting: Physician Assistant

## 2018-11-04 ENCOUNTER — Ambulatory Visit (INDEPENDENT_AMBULATORY_CARE_PROVIDER_SITE_OTHER)
Admission: RE | Admit: 2018-11-04 | Discharge: 2018-11-04 | Disposition: A | Discharge status: Home / Self Care | Payer: Self-pay | Source: Ambulatory Visit | Attending: Cardiology | Admitting: Cardiology

## 2018-11-04 ENCOUNTER — Other Ambulatory Visit: Payer: Self-pay

## 2018-11-04 DIAGNOSIS — E782 Mixed hyperlipidemia: Secondary | ICD-10-CM

## 2018-11-04 DIAGNOSIS — Z8249 Family history of ischemic heart disease and other diseases of the circulatory system: Secondary | ICD-10-CM
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
## 2018-11-11 ENCOUNTER — Encounter: Payer: Self-pay | Admitting: Physician Assistant

## 2018-11-11 ENCOUNTER — Other Ambulatory Visit: Payer: Self-pay

## 2018-11-11 ENCOUNTER — Ambulatory Visit (INDEPENDENT_AMBULATORY_CARE_PROVIDER_SITE_OTHER): Payer: Managed Care, Other (non HMO) | Admitting: Physician Assistant

## 2018-11-11 VITALS — BP 130/64 | HR 78 | Ht 63.0 in | Wt 180.0 lb

## 2018-11-11 DIAGNOSIS — E782 Mixed hyperlipidemia: Secondary | ICD-10-CM | POA: Diagnosis not present

## 2018-11-11 DIAGNOSIS — D860 Sarcoidosis of lung: Secondary | ICD-10-CM

## 2018-11-11 NOTE — Patient Instructions (Signed)
Medication Instructions:  Your physician recommends that you continue on your current medications as directed. Please refer to the Current Medication list given to you today.  *If you need a refill on your cardiac medications before your next appointment, please call your pharmacy*  Lab Work: None ordered  If you have labs (blood work) drawn today and your tests are completely normal, you will receive your results only by: Marland Kitchen MyChart Message (if you have MyChart) OR . A paper copy in the mail If you have any lab test that is abnormal or we need to change your treatment, we will call you to review the results.  Testing/Procedures: 1- Echo  Please return to [facility] on ______________ at _______________ AM/PM for an Echocardiogram. Your physician has requested that you have an echocardiogram. Echocardiography is a painless test that uses sound waves to create images of your heart. It provides your doctor with information about the size and shape of your heart and how well your heart's chambers and valves are working. This procedure takes approximately one hour. There are no restrictions for this procedure. Please note; depending on visual quality an IV may need to be placed.    Follow-Up: At [facility], you and your health needs are our priority.  As part of our continuing mission to provide you with exceptional heart care, we have created designated Provider Care Teams.  These Care Teams include your primary Cardiologist (physician) and Advanced Practice Providers (APPs -  Physician Assistants and Nurse Practitioners) who all work together to provide you with the care you need, when you need it.  Your next appointment:   PRN  The format for your next appointment:   In Person  Provider:    You may see Kate Sable, MD or Christell Faith, PA-C.

## 2018-12-09 ENCOUNTER — Other Ambulatory Visit: Payer: Self-pay | Admitting: Internal Medicine

## 2018-12-15 ENCOUNTER — Ambulatory Visit (INDEPENDENT_AMBULATORY_CARE_PROVIDER_SITE_OTHER): Payer: Managed Care, Other (non HMO)

## 2018-12-15 ENCOUNTER — Telehealth: Payer: Self-pay

## 2018-12-15 ENCOUNTER — Other Ambulatory Visit: Payer: Self-pay

## 2018-12-15 DIAGNOSIS — D869 Sarcoidosis, unspecified: Secondary | ICD-10-CM

## 2018-12-15 NOTE — Telephone Encounter (Signed)
Call to patient to discuss results of echo. She verbalized understanding. No further orders at this time.   Pt inquiring when she should come back for next appt. Per AVS, PRN basis.   Routing to provider to further advise.

## 2018-12-15 NOTE — Telephone Encounter (Signed)
Work up has been mostly unrevealing outside of trivially valvular findings. Calcium score of 0. She should continue primary prevention and risk factor modification with optimal BP/HR/lipid/glucose control. Heart heathy diet with regular exercise and weight loss are also recommended. She can follow up with her primary cardiologist in 12 months, sooner if needed. Echo can be considered at that time as well.

## 2018-12-15 NOTE — Telephone Encounter (Signed)
-----   Message from Rise Mu, PA-C sent at 12/15/2018  4:07 PM EST ----- Echo showed normal pump function and normal wall motion.  Trivially leaky mitral and tricuspid valves.   Overall, reassuring study. The leaky valves can be monitored with periodic echo.

## 2018-12-16 NOTE — Telephone Encounter (Signed)
Returned call to patient for update on POC notes from [employer]>   Pt verbalized understanding and had no further questions at this time.   Advised pt to call for any further questions or concerns.

## 2018-12-23 ENCOUNTER — Ambulatory Visit: Payer: Managed Care, Other (non HMO) | Admitting: Internal Medicine

## 2019-01-26 ENCOUNTER — Ambulatory Visit: Payer: Managed Care, Other (non HMO) | Admitting: Nurse Practitioner

## 2019-01-26 ENCOUNTER — Other Ambulatory Visit: Payer: Self-pay

## 2019-01-26 VITALS — BP 126/66 | HR 73 | Temp 97.2°F | Resp 18 | Ht 63.0 in | Wt 184.0 lb

## 2019-01-26 DIAGNOSIS — Z008 Encounter for other general examination: Secondary | ICD-10-CM

## 2019-01-26 NOTE — Progress Notes (Addendum)
Christy Escobar is a 69 y.o. pleasant female who presents to the Clinic today for her Biometric Screening exam. She reports that at this time she has no complaints. She does have a PCP, Christy Pheasant, MD, who she sees for her general health care. Vital signs are normal today BP 126/66 (BP Location: Left Arm, Patient Position: Sitting, Cuff Size: Normal)   Pulse 73   Temp (!) 97.2 F (36.2 C) (Temporal)   Resp 18   Ht 5\' 3"  (1.6 m)   Wt 184 lb (83.5 kg)   LMP 12/14/1984   SpO2 100%   BMI 32.59 kg/m   Physical Exam Constitutional:      General: She is not in acute distress.    Appearance: Normal appearance.  HENT:     Head: Normocephalic.     Right Ear: Tympanic membrane and ear canal normal.     Left Ear: Tympanic membrane and ear canal normal.     Nose: Nose normal.     Mouth/Throat:     Mouth: Mucous membranes are moist.     Pharynx: Oropharynx is clear.  Eyes:     Extraocular Movements: Extraocular movements intact.     Conjunctiva/sclera: Conjunctivae normal.     Pupils: Pupils are equal, round, and reactive to light.  Cardiovascular:     Rate and Rhythm: Normal rate and regular rhythm.     Pulses: Normal pulses.     Heart sounds: Normal heart sounds.  Pulmonary:     Effort: Pulmonary effort is normal.     Breath sounds: Normal breath sounds.  Musculoskeletal:        General: Normal range of motion.     Cervical back: Neck supple. No rigidity.  Skin:    General: Skin is warm and dry.  Neurological:     General: No focal deficit present.     Mental Status: She is alert and oriented to person, place, and time.     Cranial Nerves: No facial asymmetry.     Motor: Motor function is intact.     Gait: Gait is intact.     Deep Tendon Reflexes:     Reflex Scores:      Bicep reflexes are 2+ on the right side and 2+ on the left side.      Brachioradialis reflexes are 2+ on the right side and 2+ on the left side.      Patellar reflexes are 2+ on the right side and 2+ on the  left side.    Comments: Stands on one foot without difficulty.  Psychiatric:        Mood and Affect: Mood normal.   Assessment: 69 y.o. female here for Biometric Screening exam with normal limited physical exam.  Plan: Labs pending. Discussed with Christy Escobar need for well balanced diet and physical activity. She plans to increase her walking and will pay attention to foods that she is eating and improve her diet. She will f/u with her PCP on a regular basis for her general health care.

## 2019-01-27 LAB — LIPID PANEL
Chol/HDL Ratio: 3.8 ratio (ref 0.0–4.4)
Cholesterol, Total: 191 mg/dL (ref 100–199)
HDL: 50 mg/dL (ref >39)
LDL Chol Calc (NIH): 110 mg/dL — ABNORMAL HIGH (ref 0–99)
Triglycerides: 175 mg/dL — ABNORMAL HIGH (ref 0–149)
VLDL Cholesterol Cal: 31 mg/dL (ref 5–40)

## 2019-01-27 LAB — GLUCOSE, RANDOM: Glucose: 107 mg/dL — ABNORMAL HIGH (ref 65–99)

## 2019-02-17 ENCOUNTER — Other Ambulatory Visit: Payer: Self-pay | Admitting: Internal Medicine

## 2019-03-08 ENCOUNTER — Other Ambulatory Visit: Payer: Self-pay | Admitting: Internal Medicine

## 2019-04-02 ENCOUNTER — Other Ambulatory Visit: Payer: Self-pay | Admitting: Internal Medicine

## 2019-04-05 ENCOUNTER — Other Ambulatory Visit: Payer: Self-pay

## 2019-04-05 ENCOUNTER — Encounter: Payer: Self-pay | Admitting: Internal Medicine

## 2019-04-05 ENCOUNTER — Ambulatory Visit: Payer: Managed Care, Other (non HMO) | Admitting: Internal Medicine

## 2019-04-05 DIAGNOSIS — F439 Reaction to severe stress, unspecified: Secondary | ICD-10-CM

## 2019-04-05 DIAGNOSIS — R739 Hyperglycemia, unspecified: Secondary | ICD-10-CM

## 2019-04-05 DIAGNOSIS — R7989 Other specified abnormal findings of blood chemistry: Secondary | ICD-10-CM

## 2019-04-05 DIAGNOSIS — E7800 Pure hypercholesterolemia, unspecified: Secondary | ICD-10-CM | POA: Diagnosis not present

## 2019-04-05 DIAGNOSIS — D860 Sarcoidosis of lung: Secondary | ICD-10-CM

## 2019-04-05 DIAGNOSIS — K219 Gastro-esophageal reflux disease without esophagitis: Secondary | ICD-10-CM

## 2019-04-05 DIAGNOSIS — D869 Sarcoidosis, unspecified: Secondary | ICD-10-CM

## 2019-04-05 DIAGNOSIS — R945 Abnormal results of liver function studies: Secondary | ICD-10-CM
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
## 2019-04-07 DIAGNOSIS — J189 Pneumonia, unspecified organism: Secondary | ICD-10-CM

## 2019-04-07 HISTORY — DX: Pneumonia, unspecified organism: J18.9

## 2019-04-12 ENCOUNTER — Emergency Department
Admission: EM | Admit: 2019-04-12 | Discharge: 2019-04-12 | Disposition: A | Discharge status: Home / Self Care | Payer: Managed Care, Other (non HMO) | Attending: Emergency Medicine | Admitting: Emergency Medicine

## 2019-04-12 ENCOUNTER — Other Ambulatory Visit: Payer: Self-pay

## 2019-04-12 ENCOUNTER — Emergency Department: Payer: Managed Care, Other (non HMO)

## 2019-04-12 DIAGNOSIS — Z8541 Personal history of malignant neoplasm of cervix uteri: Secondary | ICD-10-CM | POA: Diagnosis not present

## 2019-04-12 DIAGNOSIS — R1011 Right upper quadrant pain: Secondary | ICD-10-CM | POA: Insufficient documentation

## 2019-04-12 LAB — CBC
HCT: 39.2 % (ref 36.0–46.0)
Hemoglobin: 12.9 g/dL (ref 12.0–15.0)
MCH: 30.1 pg (ref 26.0–34.0)
MCHC: 32.9 g/dL (ref 30.0–36.0)
MCV: 91.4 fL (ref 80.0–100.0)
Platelets: 270 10*3/uL (ref 150–400)
RBC: 4.29 MIL/uL (ref 3.87–5.11)
RDW: 12.1 % (ref 11.5–15.5)
WBC: 11.5 10*3/uL — ABNORMAL HIGH (ref 4.0–10.5)
nRBC: 0 % (ref 0.0–0.2)

## 2019-04-12 LAB — URINALYSIS, COMPLETE (UACMP) WITH MICROSCOPIC
Bilirubin Urine: NEGATIVE
Glucose, UA: NEGATIVE mg/dL
Hgb urine dipstick: NEGATIVE
Ketones, ur: NEGATIVE mg/dL
Leukocytes,Ua: NEGATIVE
Nitrite: NEGATIVE
Protein, ur: 30 mg/dL — AB
Specific Gravity, Urine: 1.032 — ABNORMAL HIGH (ref 1.005–1.030)
pH: 5 (ref 5.0–8.0)

## 2019-04-12 LAB — COMPREHENSIVE METABOLIC PANEL
ALT: 80 U/L — ABNORMAL HIGH (ref 0–44)
AST: 58 U/L — ABNORMAL HIGH (ref 15–41)
Albumin: 3.9 g/dL (ref 3.5–5.0)
Alkaline Phosphatase: 88 U/L (ref 38–126)
Anion gap: 12 (ref 5–15)
BUN: 18 mg/dL (ref 8–23)
CO2: 23 mmol/L (ref 22–32)
Calcium: 9.2 mg/dL (ref 8.9–10.3)
Chloride: 102 mmol/L (ref 98–111)
Creatinine, Ser: 0.76 mg/dL (ref 0.44–1.00)
GFR calc Af Amer: >60 mL/min (ref >60)
GFR calc non Af Amer: >60 mL/min (ref >60)
Glucose, Bld: 150 mg/dL — ABNORMAL HIGH (ref 70–99)
Potassium: 3.3 mmol/L — ABNORMAL LOW (ref 3.5–5.1)
Sodium: 137 mmol/L (ref 135–145)
Total Bilirubin: 0.9 mg/dL (ref 0.3–1.2)
Total Protein: 8 g/dL (ref 6.5–8.1)

## 2019-04-12 LAB — LIPASE, BLOOD: Lipase: 24 U/L (ref 11–51)

## 2019-04-12 MED ORDER — OMEPRAZOLE 20 MG PO CPDR: 20.0000 mg | DELAYED_RELEASE_CAPSULE | Freq: Two times a day (BID) | ORAL | 3 refills | Status: DC

## 2019-04-12 MED ORDER — SODIUM CHLORIDE 0.9% FLUSH: 3.0000 mL | Freq: Once | INTRAVENOUS | Status: DC

## 2019-04-12 MED ORDER — ACETAMINOPHEN 500 MG PO TABS
1000.0000 mg | ORAL_TABLET | Freq: Once | ORAL | Status: AC
  Administered 2019-04-12: 15:00:00 1000 mg via ORAL
  Filled 2019-04-12: qty 2

## 2019-04-12 MED ORDER — OXYCODONE-ACETAMINOPHEN 5-325 MG PO TABS: 1.0000 | ORAL_TABLET | Freq: Three times a day (TID) | ORAL | 0 refills | Status: DC | PRN

## 2019-04-12 NOTE — ED Notes (Addendum)
Pt reports similar episode to what is happening today last Saturday. States RUQ sharp pain which inc with movement or deep breathing. Denies nausea, tenderness, dizziness, SOB, or that pain radiates to anywhere else. Denies any major changes in food/drink consumption. Reports BMs and urination have been baseline for her. Pt resting calmly in bed. Pt given warm blanket. Visitor at bedside.

## 2019-04-12 NOTE — ED Triage Notes (Signed)
Pt c/o RUQ pain for the past 24hr, states she had an episode one day last week. Denies N/V/D. Still has her gall bladder.

## 2019-04-12 NOTE — ED Notes (Signed)
Pt remains off unit at [facility].  
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
## 2019-04-12 NOTE — ED Notes (Signed)
EDP Williams at bedside.  

## 2019-04-13 ENCOUNTER — Telehealth: Payer: Self-pay | Admitting: Internal Medicine

## 2019-04-13 NOTE — Telephone Encounter (Signed)
Pt husband called.. Pt was seen in The ED yesterday for side pain. Pt is wanting an appt with Dr. Nicki Reaper

## 2019-04-13 NOTE — Telephone Encounter (Signed)
Spoke with patient and patients husband. Pt went to [facility] walk in yesterday for 8/10 abd pain x 4 days. Patient was sent from there over to [facility] ED stating there was nothing they could do for her there. Was in ED from 9am-3:30pm. Localized pain to RUQ right under breast bone. Constant pain. Feels like it is hard for her to get a deep breath. Pt stated pain is worse at night when she is trying to lay down. No fever, nausea, vomiting, diarrhea, constipation, bleeding, etc. She is still able to eat and drink. ED did some labs- cbc w/ diff, cmp, lipase. Also collected urine for U/A and did an abdominal US limited- rt side. Pt stated that they confirmed nothing acute going on and sent her home with pain meds and omeprazole and told her she needed to see a GI doctor. Advised that Dr Nicki Reaper is not in the office this PM and has a full schedule. Advised that with her pain level, she should return to ED. Patient stated that she is not going back to the ED because they sat over there all day yesterday for them to do nothing and send her home with the same pain level she had when she got there.

## 2019-04-13 NOTE — Telephone Encounter (Signed)
Advised of below. Patient has been scheduled for 8:00 tomorrow morning. Patient will go to ED if symptoms worsen or develops new symptoms prior to appt.

## 2019-04-13 NOTE — Telephone Encounter (Signed)
Reviewed message.  I am not in the office this pm.  Agree with need for evaluation given increased pain and not being able to get her breath.  I can work her in tomorrow am (I think I have an 8:00) appt,  but really feel (with current symptoms) needs evaluation today - if not ER - [facility].

## 2019-04-14 ENCOUNTER — Ambulatory Visit
Admission: RE | Admit: 2019-04-14 | Discharge: 2019-04-14 | Disposition: A | Discharge status: Home / Self Care | Payer: Managed Care, Other (non HMO) | Source: Ambulatory Visit | Attending: Internal Medicine | Admitting: Internal Medicine

## 2019-04-14 ENCOUNTER — Ambulatory Visit: Payer: Managed Care, Other (non HMO) | Admitting: Internal Medicine

## 2019-04-14 ENCOUNTER — Other Ambulatory Visit: Payer: Self-pay

## 2019-04-14 ENCOUNTER — Encounter: Payer: Self-pay | Admitting: Internal Medicine

## 2019-04-14 ENCOUNTER — Other Ambulatory Visit
Admission: RE | Admit: 2019-04-14 | Discharge: 2019-04-14 | Disposition: A | Discharge status: Home / Self Care | Payer: Managed Care, Other (non HMO) | Source: Ambulatory Visit | Attending: Internal Medicine | Admitting: Internal Medicine

## 2019-04-14 ENCOUNTER — Ambulatory Visit: Payer: Managed Care, Other (non HMO) | Attending: Internal Medicine

## 2019-04-14 VITALS — BP 130/82 | HR 92 | Temp 97.8°F | Resp 16 | Wt 181.4 lb

## 2019-04-14 DIAGNOSIS — R071 Chest pain on breathing: Secondary | ICD-10-CM

## 2019-04-14 DIAGNOSIS — R7989 Other specified abnormal findings of blood chemistry: Secondary | ICD-10-CM

## 2019-04-14 DIAGNOSIS — R109 Unspecified abdominal pain: Secondary | ICD-10-CM | POA: Insufficient documentation

## 2019-04-14 DIAGNOSIS — R1010 Upper abdominal pain, unspecified: Secondary | ICD-10-CM

## 2019-04-14 DIAGNOSIS — R079 Chest pain, unspecified: Secondary | ICD-10-CM | POA: Diagnosis not present

## 2019-04-14 DIAGNOSIS — Z20822 Contact with and (suspected) exposure to covid-19: Secondary | ICD-10-CM

## 2019-04-14 DIAGNOSIS — J189 Pneumonia, unspecified organism: Secondary | ICD-10-CM

## 2019-04-14 DIAGNOSIS — D869 Sarcoidosis, unspecified: Secondary | ICD-10-CM

## 2019-04-14 DIAGNOSIS — D72829 Elevated white blood cell count, unspecified: Secondary | ICD-10-CM

## 2019-04-14 DIAGNOSIS — R945 Abnormal results of liver function studies: Secondary | ICD-10-CM

## 2019-04-14 LAB — HEPATIC FUNCTION PANEL
ALT: 49 U/L — ABNORMAL HIGH (ref 0–44)
AST: 25 U/L (ref 15–41)
Albumin: 3.9 g/dL (ref 3.5–5.0)
Alkaline Phosphatase: 92 U/L (ref 38–126)
Bilirubin, Direct: <0.1 mg/dL (ref 0.0–0.2)
Total Bilirubin: 0.6 mg/dL (ref 0.3–1.2)
Total Protein: 8.2 g/dL — ABNORMAL HIGH (ref 6.5–8.1)

## 2019-04-14 LAB — BASIC METABOLIC PANEL
Anion gap: 11 (ref 5–15)
BUN: 20 mg/dL (ref 8–23)
CO2: 28 mmol/L (ref 22–32)
Calcium: 9.2 mg/dL (ref 8.9–10.3)
Chloride: 99 mmol/L (ref 98–111)
Creatinine, Ser: 0.81 mg/dL (ref 0.44–1.00)
GFR calc Af Amer: >60 mL/min (ref >60)
GFR calc non Af Amer: >60 mL/min (ref >60)
Glucose, Bld: 113 mg/dL — ABNORMAL HIGH (ref 70–99)
Potassium: 3.6 mmol/L (ref 3.5–5.1)
Sodium: 138 mmol/L (ref 135–145)

## 2019-04-14 LAB — CBC WITH DIFFERENTIAL/PLATELET
Abs Immature Granulocytes: 0.05 10*3/uL (ref 0.00–0.07)
Basophils Absolute: 0.1 10*3/uL (ref 0.0–0.1)
Basophils Relative: 1 %
Eosinophils Absolute: 0.1 10*3/uL (ref 0.0–0.5)
Eosinophils Relative: 1 %
HCT: 38.9 % (ref 36.0–46.0)
Hemoglobin: 13 g/dL (ref 12.0–15.0)
Immature Granulocytes: 1 %
Lymphocytes Relative: 21 %
Lymphs Abs: 2.2 10*3/uL (ref 0.7–4.0)
MCH: 30.2 pg (ref 26.0–34.0)
MCHC: 33.4 g/dL (ref 30.0–36.0)
MCV: 90.5 fL (ref 80.0–100.0)
Monocytes Absolute: 0.7 10*3/uL (ref 0.1–1.0)
Monocytes Relative: 7 %
Neutro Abs: 7.3 10*3/uL (ref 1.7–7.7)
Neutrophils Relative %: 69 %
Platelets: 292 10*3/uL (ref 150–400)
RBC: 4.3 MIL/uL (ref 3.87–5.11)
RDW: 12.3 % (ref 11.5–15.5)
WBC: 10.4 10*3/uL (ref 4.0–10.5)
nRBC: 0 % (ref 0.0–0.2)

## 2019-04-14 MED ORDER — AMOXICILLIN-POT CLAVULANATE 875-125 MG PO TABS: 1.0000 | ORAL_TABLET | Freq: Two times a day (BID) | ORAL | 0 refills | Status: DC

## 2019-04-14 MED ORDER — AZITHROMYCIN 250 MG PO TABS: ORAL_TABLET | ORAL | 0 refills | Status: DC
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
## 2019-04-15 ENCOUNTER — Telehealth: Payer: Self-pay

## 2019-04-15 ENCOUNTER — Ambulatory Visit: Admission: RE | Admit: 2019-04-15 | Payer: Managed Care, Other (non HMO) | Source: Ambulatory Visit

## 2019-04-15 DIAGNOSIS — J189 Pneumonia, unspecified organism: Secondary | ICD-10-CM

## 2019-04-15 DIAGNOSIS — R9389 Abnormal findings on diagnostic imaging of other specified body structures: Secondary | ICD-10-CM

## 2019-04-15 LAB — SARS-COV-2, NAA 2 DAY TAT

## 2019-04-15 LAB — NOVEL CORONAVIRUS, NAA: SARS-CoV-2, NAA: NOT DETECTED

## 2019-04-15 NOTE — Telephone Encounter (Signed)
Called patient to get update. Patient aware of COVID results. Has started her abx. Feeling some better but not significantly better. Stated that she is coughing a little more- mostly at night when she tries to lay down. She is going to stay in and will let us know if symptoms does not continue to improve.

## 2019-04-15 NOTE — Telephone Encounter (Signed)
Noted.  Call for update Monday.  Thanks

## 2019-04-16 ENCOUNTER — Encounter: Payer: Self-pay | Admitting: Internal Medicine

## 2019-04-16 DIAGNOSIS — D860 Sarcoidosis of lung: Secondary | ICD-10-CM | POA: Insufficient documentation

## 2019-04-16 NOTE — Assessment & Plan Note (Signed)
On zoloft.  Stable.  Follow.  

## 2019-04-16 NOTE — Assessment & Plan Note (Signed)
On omeprazole.  Follow.  

## 2019-04-16 NOTE — Assessment & Plan Note (Signed)
Breathing stable.  Follow.    

## 2019-04-16 NOTE — Assessment & Plan Note (Signed)
Low carb diet and exercise.  Follow met b and a1c.  

## 2019-04-16 NOTE — Assessment & Plan Note (Signed)
Has been followed by pulmonary - Dr Raul Del.  Stable.  Breathing stable.  Follow.

## 2019-04-16 NOTE — Assessment & Plan Note (Signed)
On crestor.  Low cholesterol diet and exercise.  Follow lipid panel and liver function tests.   

## 2019-04-16 NOTE — Assessment & Plan Note (Signed)
Diet and exercise.  Follow liver function tests.   

## 2019-04-18 ENCOUNTER — Encounter: Payer: Self-pay | Admitting: Internal Medicine

## 2019-04-18 DIAGNOSIS — J189 Pneumonia, unspecified organism: Secondary | ICD-10-CM | POA: Insufficient documentation

## 2019-04-18 NOTE — Assessment & Plan Note (Signed)
Pain and cough as outlined.  Symptoms as outlined.  cxr with RLL infiltrate.  Treat with augmentin and azithromycin as directed.  Will check for covid.  Discussed quarantine.  Given findings on cxr, will hold on abdominal CT.  She is eating.  Follow closely.  Any change or worsening symptoms, she is to be reevaluated.

## 2019-04-18 NOTE — Assessment & Plan Note (Signed)
Right lower anterior chest/rib pain to palpation.  Also notices when she takes a deep breath.  Some cough.  cxr with RLL infiltrate.  Treat for CAP. Follow.  Will need f/u cxr and/or CT pending patients response.

## 2019-04-18 NOTE — Assessment & Plan Note (Signed)
Has been followed by pulmonary and has been stable.  Follow.

## 2019-04-18 NOTE — Telephone Encounter (Signed)
We had discussed remaining out of work this week and following to see how she is doing.  Ok to take robitussin DM twice a day as needed.  Can schedule a virtual f/u end of this week if desires or call with update.  If getting better, hold on scan.  Will need f/u cxr in the future, but too soon for this - if getting better.

## 2019-04-18 NOTE — Assessment & Plan Note (Signed)
Has been elevated previously and felt to be due to fatty liver.  Slightly elevated in ER.  Recheck today.  RUQ ultrasound as outlined.

## 2019-04-18 NOTE — Telephone Encounter (Signed)
Patient stated that she is feeling better. Not 100% but is not in as much pain. Patient is complaining of a dry nagging cough- worse at night. She was wondering if she could take any OTC cough syrup? She has been resting, taking deep breaths. She took the last pill of her z-pak this morning. Still on amoxicillin. Feels like she is doing better day by day. She did ask when she needs to f/u with you and what we decided to do about follow up scans, going bak to work, etc.? Advised that we were waiting on update today to determine what we need to do from there.

## 2019-04-18 NOTE — Telephone Encounter (Signed)
Pt aware and will call with update toward the end of the week. Does not want virtual visit.

## 2019-04-18 NOTE — Assessment & Plan Note (Signed)
Was evaluated in ER for abdominal pain.  Right upper quadrant ultrasound negative.  Liver function tests - slightly elevated.  Came in with persistent pain.  Was initially going to order abdominal CT.  Given exam findings and increased cough with deep breathing, etc - cxr obtained.  Revealed RLL infiltrate.  Will treat for CAP.  Hold abdominal CT.  Follow closely.  She is eating.  Keep bowels moving.

## 2019-04-20 NOTE — Telephone Encounter (Signed)
Left message for patient to call back and give update and see how she is feeling

## 2019-04-29 NOTE — Telephone Encounter (Signed)
Called patient to get update. She says she is feeling much better and has returned to work, normal daily activities. When do you want to have her do another cxr ?

## 2019-04-30 NOTE — Addendum Note (Signed)
Addended by: Alisa Graff on: 04/30/2019 03:45 PM   Modules accepted: Orders

## 2019-04-30 NOTE — Telephone Encounter (Signed)
Given she is doing better., recommend f/u cxr in two weeks.  I have placed the order.

## 2019-05-03 NOTE — Telephone Encounter (Signed)
Pt scheduled for follow up chest xray

## 2019-05-17 ENCOUNTER — Other Ambulatory Visit: Payer: Self-pay

## 2019-05-17 ENCOUNTER — Ambulatory Visit (INDEPENDENT_AMBULATORY_CARE_PROVIDER_SITE_OTHER): Payer: Managed Care, Other (non HMO)

## 2019-05-17 ENCOUNTER — Other Ambulatory Visit: Payer: Managed Care, Other (non HMO)

## 2019-05-17 DIAGNOSIS — R9389 Abnormal findings on diagnostic imaging of other specified body structures: Secondary | ICD-10-CM

## 2019-05-17 DIAGNOSIS — J189 Pneumonia, unspecified organism: Secondary | ICD-10-CM

## 2019-05-20 ENCOUNTER — Ambulatory Visit: Payer: Managed Care, Other (non HMO) | Admitting: Internal Medicine

## 2019-05-20 ENCOUNTER — Other Ambulatory Visit: Payer: Self-pay

## 2019-05-20 DIAGNOSIS — R739 Hyperglycemia, unspecified: Secondary | ICD-10-CM | POA: Diagnosis not present

## 2019-05-20 DIAGNOSIS — K219 Gastro-esophageal reflux disease without esophagitis: Secondary | ICD-10-CM

## 2019-05-20 DIAGNOSIS — E7800 Pure hypercholesterolemia, unspecified: Secondary | ICD-10-CM

## 2019-05-20 DIAGNOSIS — R7989 Other specified abnormal findings of blood chemistry: Secondary | ICD-10-CM

## 2019-05-20 DIAGNOSIS — R634 Abnormal weight loss: Secondary | ICD-10-CM

## 2019-05-20 DIAGNOSIS — J189 Pneumonia, unspecified organism: Secondary | ICD-10-CM

## 2019-05-20 DIAGNOSIS — F439 Reaction to severe stress, unspecified: Secondary | ICD-10-CM

## 2019-05-20 DIAGNOSIS — D860 Sarcoidosis of lung: Secondary | ICD-10-CM

## 2019-05-20 DIAGNOSIS — R945 Abnormal results of liver function studies: Secondary | ICD-10-CM
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
## 2019-05-21 ENCOUNTER — Telehealth: Payer: Self-pay | Admitting: Internal Medicine

## 2019-05-21 ENCOUNTER — Encounter: Payer: Self-pay | Admitting: Internal Medicine

## 2019-05-21 DIAGNOSIS — J189 Pneumonia, unspecified organism: Secondary | ICD-10-CM

## 2019-05-21 DIAGNOSIS — D869 Sarcoidosis, unspecified: Secondary | ICD-10-CM

## 2019-05-21 DIAGNOSIS — R634 Abnormal weight loss: Secondary | ICD-10-CM | POA: Insufficient documentation

## 2019-05-21 DIAGNOSIS — R9389 Abnormal findings on diagnostic imaging of other specified body structures: Secondary | ICD-10-CM

## 2019-05-21 NOTE — Assessment & Plan Note (Signed)
Previous pain and symptoms as outlined in previous note.  cxr with pneumonia.  Treated as outlined.  No pain.  No cough or congestion now.  No sob.  cxr with persistent changes.  Check CT chest.

## 2019-05-21 NOTE — Assessment & Plan Note (Signed)
On crestor.  Low cholesterol diet and exercise.  Follow lipid panel and liver function tests.   

## 2019-05-21 NOTE — Telephone Encounter (Signed)
Please notify and schedule pt for fasting labs within the next week.  Thank you.

## 2019-05-21 NOTE — Telephone Encounter (Signed)
I previously ordered chest Ct.  I think we were able to get insurance authorization for this.  If not, I can order again. Pt recently had pneumonia. Recent cxr with persistent changes.  Needs chest CT.  Also has a history of pulmonary sarcoidosis.  Let me know if I need to order again.  Thank you.

## 2019-05-21 NOTE — Assessment & Plan Note (Signed)
Discussed diet and exercise.  Discussed treatment options.  Pt desires a trial of saxenda.  CCM referral.

## 2019-05-21 NOTE — Assessment & Plan Note (Signed)
Has been elevated previously and felt to be due to fatty liver.  Recheck with next labs.  Diet, exercise and weight loss.

## 2019-05-21 NOTE — Assessment & Plan Note (Signed)
On omeprazole.  No upper symptoms reported.   

## 2019-05-21 NOTE — Assessment & Plan Note (Signed)
Continue on zoloft.  Stable.

## 2019-05-21 NOTE — Assessment & Plan Note (Signed)
History of sarcoid.  Has been stable.  Recent pneumonia.  Symptoms resolved.  cxr with persistent changes.  Check CT chest. Further w/up pending results.

## 2019-05-21 NOTE — Assessment & Plan Note (Signed)
Last a1c 5.9.  Recheck a1c.  Low carb diet and exercise. Discussed treatment options for weight loss.  CCM referral for saxenda.  Follow.

## 2019-05-23 ENCOUNTER — Telehealth: Payer: Self-pay | Admitting: Internal Medicine

## 2019-05-23 NOTE — Chronic Care Management (AMB) (Signed)
  Care Management   Note  05/23/2019 Name: Christy Escobar MRN: [id number] DOB: 18-Mar-1950  Christy Escobar is a 69 y.o. year old female who is a primary care patient of Einar Pheasant, MD. I reached out to [name] by phone today in response to a referral sent by Ms. Sheralyn Boatman Winstead's health plan.    Ms. Mesker was given information about care management services today including:  1. Care management services include personalized support from designated clinical staff supervised by her physician, including individualized plan of care and coordination with other care providers 2. 24/7 contact phone numbers for assistance for urgent and routine care needs. 3. The patient may stop care management services at any time by phone call to the office staff.  Patient agreed to services and verbal consent obtained.   Follow up plan: Telephone appointment with care management team member scheduled for:06/10/2019  Glenna Durand, LPN Health Advisor, [facility] ??[email or web address] [phone]

## 2019-05-23 NOTE — Telephone Encounter (Signed)
Called patient to schedule fasting lab appt she wanted to know why she needed labs and wanted Larena Glassman to call her back

## 2019-05-23 NOTE — Telephone Encounter (Signed)
Patient does not get her labs done here at our clinic. She is going to have them done through her work.

## 2019-05-23 NOTE — Telephone Encounter (Signed)
Good morning!  Ct chest was ordered 04/14/2019 I spoke with Trish per provider to close out CT due to denial. Please advise and Thank you!

## 2019-05-24 NOTE — Addendum Note (Signed)
Addended by: Alisa Graff on: 05/24/2019 04:06 AM   Modules accepted: Orders

## 2019-05-24 NOTE — Telephone Encounter (Signed)
Thank you :)

## 2019-05-24 NOTE — Telephone Encounter (Signed)
Pt is scheduled on 06/01/2019 to have CT done

## 2019-05-24 NOTE — Telephone Encounter (Signed)
CT chest reordered.  Please schedule.  Pt with history of pneumonia.  Recent cxr - persistent abnormal.  Also has a history of sarcoid.

## 2019-05-25 NOTE — Telephone Encounter (Signed)
Your welcome.

## 2019-05-31 ENCOUNTER — Other Ambulatory Visit: Payer: Managed Care, Other (non HMO)

## 2019-05-31 ENCOUNTER — Other Ambulatory Visit: Payer: Self-pay

## 2019-05-31 DIAGNOSIS — R945 Abnormal results of liver function studies: Secondary | ICD-10-CM

## 2019-05-31 DIAGNOSIS — E7800 Pure hypercholesterolemia, unspecified: Secondary | ICD-10-CM

## 2019-05-31 DIAGNOSIS — R739 Hyperglycemia, unspecified: Secondary | ICD-10-CM | POA: Diagnosis not present

## 2019-05-31 DIAGNOSIS — R7989 Other specified abnormal findings of blood chemistry: Secondary | ICD-10-CM

## 2019-06-01 ENCOUNTER — Ambulatory Visit: Admission: RE | Admit: 2019-06-01 | Payer: Managed Care, Other (non HMO) | Source: Ambulatory Visit

## 2019-06-01 ENCOUNTER — Telehealth: Payer: Self-pay | Admitting: Internal Medicine

## 2019-06-01 LAB — CBC WITH DIFFERENTIAL/PLATELET
Basophils Absolute: 0.1 10*3/uL (ref 0.0–0.2)
Basos: 1 %
EOS (ABSOLUTE): 0.1 10*3/uL (ref 0.0–0.4)
Eos: 1 %
Hematocrit: 42.2 % (ref 34.0–46.6)
Hemoglobin: 13.9 g/dL (ref 11.1–15.9)
Immature Grans (Abs): 0 10*3/uL (ref 0.0–0.1)
Immature Granulocytes: 0 %
Lymphocytes Absolute: 2.2 10*3/uL (ref 0.7–3.1)
Lymphs: 36 %
MCH: 30 pg (ref 26.6–33.0)
MCHC: 32.9 g/dL (ref 31.5–35.7)
MCV: 91 fL (ref 79–97)
Monocytes Absolute: 0.5 10*3/uL (ref 0.1–0.9)
Monocytes: 7 %
Neutrophils Absolute: 3.3 10*3/uL (ref 1.4–7.0)
Neutrophils: 55 %
Platelets: 253 10*3/uL (ref 150–450)
RBC: 4.64 x10E6/uL (ref 3.77–5.28)
RDW: 13 % (ref 11.7–15.4)
WBC: 6.1 10*3/uL (ref 3.4–10.8)

## 2019-06-01 LAB — HEPATIC FUNCTION PANEL
ALT: 28 IU/L (ref 0–32)
AST: 26 IU/L (ref 0–40)
Albumin: 4.5 g/dL (ref 3.8–4.8)
Alkaline Phosphatase: 59 IU/L (ref 48–121)
Bilirubin Total: 0.4 mg/dL (ref 0.0–1.2)
Bilirubin, Direct: 0.1 mg/dL (ref 0.00–0.40)
Total Protein: 7.2 g/dL (ref 6.0–8.5)

## 2019-06-01 LAB — LIPID PANEL
Chol/HDL Ratio: 4.7 ratio — ABNORMAL HIGH (ref 0.0–4.4)
Cholesterol, Total: 218 mg/dL — ABNORMAL HIGH (ref 100–199)
HDL: 46 mg/dL (ref >39)
LDL Chol Calc (NIH): 138 mg/dL — ABNORMAL HIGH (ref 0–99)
Triglycerides: 188 mg/dL — ABNORMAL HIGH (ref 0–149)
VLDL Cholesterol Cal: 34 mg/dL (ref 5–40)

## 2019-06-01 LAB — BASIC METABOLIC PANEL
BUN/Creatinine Ratio: 27 (ref 12–28)
BUN: 23 mg/dL (ref 8–27)
CO2: 24 mmol/L (ref 20–29)
Calcium: 9.4 mg/dL (ref 8.7–10.3)
Chloride: 103 mmol/L (ref 96–106)
Creatinine, Ser: 0.84 mg/dL (ref 0.57–1.00)
GFR calc Af Amer: 82 mL/min/{1.73_m2} (ref >59)
GFR calc non Af Amer: 71 mL/min/{1.73_m2} (ref >59)
Glucose: 97 mg/dL (ref 65–99)
Potassium: 4.6 mmol/L (ref 3.5–5.2)
Sodium: 141 mmol/L (ref 134–144)

## 2019-06-01 LAB — HGB A1C W/O EAG: Hgb A1c MFr Bld: 5.6 % (ref 4.8–5.6)

## 2019-06-01 LAB — TSH: TSH: 2.7 u[IU]/mL (ref 0.450–4.500)

## 2019-06-01 NOTE — Telephone Encounter (Signed)
Notify pt that I received her labs from outside lab.  Cholesterol increased some when compared to the last check.  I recommend continuing a low cholesterol diet and exercise.  Sugar improved.  White blood cell count, liver function tests, thyroid test and liver function tests are wnl.

## 2019-06-07 ENCOUNTER — Telehealth: Payer: Self-pay | Admitting: Internal Medicine

## 2019-06-07 NOTE — Telephone Encounter (Signed)
Melissa from [facility] called about patient's referral, CT Abdomen w/contrast. Please call her back at [phone] [phone]

## 2019-06-08 NOTE — Telephone Encounter (Signed)
LMTCB

## 2019-06-08 NOTE — Telephone Encounter (Signed)
Patient aware of results.

## 2019-06-10 ENCOUNTER — Ambulatory Visit: Payer: Managed Care, Other (non HMO) | Admitting: Pharmacist

## 2019-06-10 ENCOUNTER — Ambulatory Visit: Payer: Managed Care, Other (non HMO)

## 2019-06-10 DIAGNOSIS — R739 Hyperglycemia, unspecified: Secondary | ICD-10-CM

## 2019-06-10 DIAGNOSIS — E7800 Pure hypercholesterolemia, unspecified: Secondary | ICD-10-CM

## 2019-06-10 DIAGNOSIS — Z6832 Body mass index (BMI) 32.0-32.9, adult: Secondary | ICD-10-CM

## 2019-06-10 MED ORDER — VICTOZA 18 MG/3ML ~~LOC~~ SOPN: PEN_INJECTOR | SUBCUTANEOUS | 1 refills | Status: DC

## 2019-06-10 MED ORDER — PEN NEEDLES 32G X 4 MM MISC: 3 refills | Status: DC

## 2019-06-10 MED ORDER — SAXENDA 18 MG/3ML ~~LOC~~ SOPN: PEN_INJECTOR | SUBCUTANEOUS | 1 refills | Status: DC

## 2019-06-10 NOTE — Progress Notes (Signed)
I have reviewed the above note and agree. I was available to the pharmacist for consultation.  Undra Harriman, MD 

## 2019-06-10 NOTE — Patient Instructions (Addendum)
Visit Information  Goals Addressed            This Visit's Progress     Patient Stated   . PharmD "I want to work on weight loss" (pt-stated)       [place] (see longitudinal plan of care for additional care plan information)  Current Barriers:  . Obesity; complicated by chronic medical conditions including hyperglycemia (last A1c 5.9%); HLD; most recent BMI 32.6 (05/20/19), previous BMI 32.9 (04/05/19) . Most recent home weight: 184 lbs . Current meal patterns: o Breakfast: half egg sandwich; sometimes ham; sometimes soft drink, sometimes sweet tea o Lunch: mix; usually eats at work; pack of crackers, sometimes goes out to eat with the staff; sandwiches; o Supper: chicken in crock pot or air fryer; sides include vegetables; tries to generally avoid high carbohydrates o Snacks: sometimes peanuts o Drinks: sweet tea, sometimes soft drinks o Late: night sweet snack sometimes  . Current exercise: walks several times week, usually for 30-45 minutes at a time . Current weight management pharmacotherapy: none at this time  Pharmacist Clinical Goal(s):  Marland Kitchen Over the next 90 days, patient will work with PharmD and primary care provider to work towards 5-10% body weight loss  Interventions: . Comprehensive medication review performed, medication list updated in electronic medical record . Inter-disciplinary care team collaboration (see longitudinal plan of care) . Target 150 minutes moderate intensity exercise daily . Recommended focusing on snacks with protein, low carbohydrate content . Patient has not met goal of at least 5% of body weight loss with comprehensive lifestyle modifications alone in the past 3-6 months. Pharmacotherapy is appropriate to pursue as augmentation.  . Discussed Saxenda. Counseled on benefits, side effects. Patient amenable to start. Start 0.6 mg once weekly for 1 week, increase by 0.6 mg intervals each week as tolerated.  [facility]. PA is not  required, though copy is >$1500 for 30 day supply. Contacted [employer]. Unable to get through after being on hold for 20 minutes. Patient notes that she has to pay 20% of cost for some medications on her plan . Decided to send for Victoza to see if better covered. Contacted pharmacy, PA is required. Will collaborate w/ CMA to complete this for prediabetes. If Victoza not covered, consider metformin for prediabetes vs Weight Management referral.   Patient Self Care Activities:  . Patient will adhere to dietary modifications . Patient will target at least 150 minutes of moderate intensity exercise weekly . Patient will report any questions or concerns to provider   Please see past updates related to this goal by clicking on the "Past Updates" button in the selected goal         Patient verbalizes understanding of instructions provided today.   Plan: - Scheduled f/u call in ~ 6 weeks  Catie Darnelle Maffucci, PharmD, [place], [place] Pharmacist [place] [phone]

## 2019-06-10 NOTE — Chronic Care Management (AMB) (Signed)
Chronic Care Management   Note  06/10/2019 Name: Christy Escobar MRN: [id number] DOB: Mar 29, 1950   Subjective:  Christy Escobar is a 69 y.o. year old female who is a primary care patient of Einar Pheasant, MD. The CCM team was consulted for assistance with chronic disease management and care coordination needs.    Contacted patient for medication management review.    Review of patient status, including review of consultants reports, laboratory and other test data, was performed as part of comprehensive evaluation and provision of chronic care management services.   SDOH (Social Determinants of Health) assessments and interventions performed:  yes  Objective:  Lab Results  Component Value Date   CREATININE 0.84 05/31/2019   CREATININE 0.81 04/14/2019   CREATININE 0.76 04/12/2019    Lab Results  Component Value Date   HGBA1C 5.6 05/31/2019       Component Value Date/Time   CHOL 218 (H) 05/31/2019 0925   TRIG 188 (H) 05/31/2019 0925   HDL 46 05/31/2019 0925   CHOLHDL 4.7 (H) 05/31/2019 0925   LDLCALC 138 (H) 05/31/2019 0925    Clinical ASCVD: No  The 10-year ASCVD risk score Mikey Bussing DC Jr., et al., 2013) is: 8.9%   Values used to calculate the score:     Age: 52 years     Sex: Female     Is Non-Hispanic African American: No     Diabetic: No     Tobacco smoker: No     Systolic Blood Pressure: 782 mmHg     Is BP treated: No     HDL Cholesterol: 46 mg/dL     Total Cholesterol: 218 mg/dL    BP Readings from Last 3 Encounters:  05/20/19 126/76  04/14/19 130/82  04/12/19 129/71    Allergies  Allergen Reactions  . Clarithromycin Other (See Comments) and Nausea Only    Nausea, GI burning  GI burning  Nausea, GI burning    Medications Reviewed Today    Reviewed by De Hollingshead, [facility] (Pharmacist) on 06/10/19 at [place] List Status: <None>  Medication Order Taking? Sig Documenting Provider Last Dose Status Informant  estradiol (ESTRACE) 1 MG tablet  [id number] Yes Take 1 tablet by mouth once daily Einar Pheasant, MD Taking Active   fish oil-omega-3 fatty acids 1000 MG capsule [id number] Yes Take 1 g by mouth 3 (three) times daily. [provider] Taking Active   fluticasone (FLONASE) 50 MCG/ACT nasal spray [id number] Yes USE TWO SPRAY(S) IN EACH NOSTRIL ONCE DAILY  Patient taking differently: daily as needed. USE TWO SPRAY(S) IN EACH NOSTRIL ONCE DAILY   Einar Pheasant, MD Taking Active   minoxidil (LONITEN) 2.5 MG tablet [id number] Yes Take 2.5 mg by mouth daily.  [provider] Taking Active         Discontinued 66/44/03 4742 (Duplicate)   omeprazole (PRILOSEC) 20 MG capsule [id number] Yes Take 1 capsule (20 mg total) by mouth 2 (two) times daily before a meal. Earleen Newport, MD Taking Active   rosuvastatin (CRESTOR) 10 MG tablet [id number] Yes Take 1 tablet by mouth once daily Einar Pheasant, MD Taking Active   sertraline (ZOLOFT) 50 MG tablet [id number] Yes Take 1 tablet by mouth once daily Einar Pheasant, MD Taking Active   valACYclovir (VALTREX) 1000 MG tablet [id number] Yes as needed.  [provider] Taking Active   zolmitriptan (ZOMIG) 5 MG tablet [id number] Yes TAKE 1/2 (ONE-HALF) TABLET BY MOUTH ONCE DAILY AS NEEDED Einar Pheasant, MD Taking Active  Assessment:   Goals Addressed            This Visit's Progress     Patient Stated   . PharmD "I want to work on weight loss" (pt-stated)       [place] (see longitudinal plan of care for additional care plan information)  Current Barriers:  . Obesity; complicated by chronic medical conditions including hyperglycemia (last A1c 5.9%); HLD; most recent BMI 32.6 (05/20/19), previous BMI 32.9 (04/05/19) . Most recent home weight: 184 lbs . Current meal patterns: o Breakfast: half egg sandwich; sometimes ham; sometimes soft drink, sometimes sweet tea o Lunch: mix; usually eats at work; pack of crackers, sometimes goes out to eat  with the staff; sandwiches; o Supper: chicken in crock pot or air fryer; sides include vegetables; tries to generally avoid high carbohydrates o Snacks: sometimes peanuts o Drinks: sweet tea, sometimes soft drinks o Late: night sweet snack sometimes  . Current exercise: walks several times week, usually for 30-45 minutes at a time . Current weight management pharmacotherapy: none at this time  Pharmacist Clinical Goal(s):  Marland Kitchen Over the next 90 days, patient will work with PharmD and primary care provider to work towards 5-10% body weight loss  Interventions: . Comprehensive medication review performed, medication list updated in electronic medical record . Inter-disciplinary care team collaboration (see longitudinal plan of care) . Target 150 minutes moderate intensity exercise daily . Recommended focusing on snacks with protein, low carbohydrate content . Patient has not met goal of at least 5% of body weight loss with comprehensive lifestyle modifications alone in the past 3-6 months. Pharmacotherapy is appropriate to pursue as augmentation.  . Discussed Saxenda. Counseled on benefits, side effects. Patient amenable to start. Start 0.6 mg once weekly for 1 week, increase by 0.6 mg intervals each week as tolerated.  [facility]. PA is not required, though copy is >$1500 for 30 day supply. Contacted [employer]. Unable to get through after being on hold for 20 minutes. Patient notes that she has to pay 20% of cost for some medications on her plan . Decided to send for Victoza to see if better covered. Contacted pharmacy, PA is required. Will collaborate w/ CMA to complete this for prediabetes. If Victoza not covered, consider metformin for prediabetes vs Weight Management referral.   Patient Self Care Activities:  . Patient will adhere to dietary modifications . Patient will target at least 150 minutes of moderate intensity exercise weekly . Patient will report any questions or  concerns to provider   Please see past updates related to this goal by clicking on the "Past Updates" button in the selected goal         Plan: - Scheduled f/u call in ~ 6 weeks  Catie Darnelle Maffucci, PharmD, [place], [place] Pharmacist [place] [phone]

## 2019-06-13 ENCOUNTER — Ambulatory Visit: Payer: Self-pay | Admitting: Pharmacist

## 2019-06-13 DIAGNOSIS — R739 Hyperglycemia, unspecified: Secondary | ICD-10-CM

## 2019-06-13 DIAGNOSIS — Z6832 Body mass index (BMI) 32.0-32.9, adult: Secondary | ICD-10-CM

## 2019-06-13 NOTE — Progress Notes (Signed)
I have reviewed the above note and agree. I was available to the pharmacist for consultation.  Ronal Maybury, MD 

## 2019-06-13 NOTE — Patient Instructions (Signed)
Visit Information  Goals Addressed            This Visit's Progress     Patient Stated   . PharmD "I want to work on weight loss" (pt-stated)       [place] (see longitudinal plan of care for additional care plan information)  Current Barriers:  . Obesity; complicated by chronic medical conditions including hyperglycemia (last A1c 5.9%); HLD; most recent BMI 32.6 (05/20/19), previous BMI 32.9 (04/05/19) o Working on Copy for [employer]  . Most recent home weight: 184 lbs . Current meal patterns: o Breakfast: half egg sandwich; sometimes ham; sometimes soft drink, sometimes sweet tea o Lunch: mix; usually eats at work; pack of crackers, sometimes goes out to eat with the staff; sandwiches; o Supper: chicken in crock pot or air fryer; sides include vegetables; tries to generally avoid high carbohydrates o Snacks: sometimes peanuts o Drinks: sweet tea, sometimes soft drinks o Late: night sweet snack sometimes  . Current exercise: walks several times week, usually for 30-45 minutes at a time . Current weight management pharmacotherapy: none at this time  Pharmacist Clinical Goal(s):  Marland Kitchen Over the next 90 days, patient will work with PharmD and primary care provider to work towards 5-10% body weight loss  Interventions: . Comprehensive medication review performed, medication list updated in electronic medical record . Inter-disciplinary care team collaboration (see longitudinal plan of care) . Attempted Prior Authorization for Saxenda through [place] on Cover My Meds. PA was not accepted  . Contacted [employer]. They noted that Kirke Shaggy is excluded on her plan, and that no antiobesity drugs are covered. Will attempt an appeal to cover the medication. Faxed appeal into [employer] at [phone]  Patient Self Care Activities:  . Patient will adhere to dietary modifications . Patient will target at least 150 minutes of moderate intensity exercise weekly . Patient will report any  questions or concerns to provider   Please see past updates related to this goal by clicking on the "Past Updates" button in the selected goal         Patient verbalizes understanding of instructions provided today.    Plan:  - Will f/u on appeal within 1 week  Catie Darnelle Maffucci, PharmD, [place], [place] [phone]

## 2019-06-13 NOTE — Chronic Care Management (AMB) (Signed)
Chronic Care Management   Follow Up Note   06/13/2019 Name: Christy Escobar MRN: [id number] DOB: 01-16-1950  Referred by: Einar Pheasant, MD Reason for referral : Chronic Care Management (Medication Management)   Christy Escobar is a 69 y.o. year old female who is a primary care patient of Einar Pheasant, MD. The CCM team was consulted for assistance with chronic disease management and care coordination needs.    Care coordination completed today.  Review of patient status, including review of consultants reports, relevant laboratory and other test results, and collaboration with appropriate care team members and the patient's provider was performed as part of comprehensive patient evaluation and provision of chronic care management services.    SDOH (Social Determinants of Health) assessments performed: Yes See Care Plan activities for detailed interventions related to [name])     Outpatient Encounter Medications as of 06/13/2019  Medication Sig  . estradiol (ESTRACE) 1 MG tablet Take 1 tablet by mouth once daily  . fish oil-omega-3 fatty acids 1000 MG capsule Take 1 g by mouth 3 (three) times daily.  . fluticasone (FLONASE) 50 MCG/ACT nasal spray USE TWO SPRAY(S) IN EACH NOSTRIL ONCE DAILY (Patient taking differently: daily as needed. USE TWO SPRAY(S) IN EACH NOSTRIL ONCE DAILY)  . Insulin Pen Needle (PEN NEEDLES) 32G X 4 MM MISC Use to inject Saxenda daily  . liraglutide (VICTOZA) 18 MG/3ML SOPN Inject 0.6 mg daily. Titrate as instructed. Max daily dose 1.8 mg.  . minoxidil (LONITEN) 2.5 MG tablet Take 2.5 mg by mouth daily.   Marland Kitchen omeprazole (PRILOSEC) 20 MG capsule Take 1 capsule (20 mg total) by mouth 2 (two) times daily before a meal.  . rosuvastatin (CRESTOR) 10 MG tablet Take 1 tablet by mouth once daily  . sertraline (ZOLOFT) 50 MG tablet Take 1 tablet by mouth once daily  . valACYclovir (VALTREX) 1000 MG tablet as needed.   . zolmitriptan (ZOMIG) 5 MG tablet TAKE 1/2 (ONE-HALF)  TABLET BY MOUTH ONCE DAILY AS NEEDED   No facility-administered encounter medications on file as of 06/13/2019.     Objective:   Goals Addressed            This Visit's Progress     Patient Stated   . PharmD "I want to work on weight loss" (pt-stated)       [place] (see longitudinal plan of care for additional care plan information)  Current Barriers:  . Obesity; complicated by chronic medical conditions including hyperglycemia (last A1c 5.9%); HLD; most recent BMI 32.6 (05/20/19), previous BMI 32.9 (04/05/19) o Working on Copy for [employer]  . Most recent home weight: 184 lbs . Current meal patterns: o Breakfast: half egg sandwich; sometimes ham; sometimes soft drink, sometimes sweet tea o Lunch: mix; usually eats at work; pack of crackers, sometimes goes out to eat with the staff; sandwiches; o Supper: chicken in crock pot or air fryer; sides include vegetables; tries to generally avoid high carbohydrates o Snacks: sometimes peanuts o Drinks: sweet tea, sometimes soft drinks o Late: night sweet snack sometimes  . Current exercise: walks several times week, usually for 30-45 minutes at a time . Current weight management pharmacotherapy: none at this time  Pharmacist Clinical Goal(s):  Marland Kitchen Over the next 90 days, patient will work with PharmD and primary care provider to work towards 5-10% body weight loss  Interventions: . Comprehensive medication review performed, medication list updated in electronic medical record . Inter-disciplinary care team collaboration (see longitudinal plan  of care) . Attempted Prior Authorization for Saxenda through [place] on Cover My Meds. PA was not accepted  . Contacted [employer]. They noted that Kirke Shaggy is excluded on her plan, and that no antiobesity drugs are covered. Will attempt an appeal to cover the medication. Faxed appeal into [employer] at [phone]  Patient Self Care Activities:  . Patient will adhere to dietary  modifications . Patient will target at least 150 minutes of moderate intensity exercise weekly . Patient will report any questions or concerns to provider   Please see past updates related to this goal by clicking on the "Past Updates" button in the selected goal          Plan:  - Will f/u on appeal within 1 week  Catie Darnelle Maffucci, PharmD, [place], [place] Pharmacist [place] [phone]

## 2019-06-21 ENCOUNTER — Ambulatory Visit: Payer: Self-pay | Admitting: Pharmacist

## 2019-06-21 NOTE — Chronic Care Management (AMB) (Signed)
  Chronic Care Management   Note  06/21/2019 Name: Christy Escobar MRN: [id number] DOB: 20-Feb-1950  Christy Escobar is a 69 y.o. year old female who is a primary care patient of Einar Pheasant, MD. The CCM team was consulted for assistance with chronic disease management and care coordination needs.  Patient called to f/u on status of Saxenda/Victoza. I faxed coverage appeal to [place] on 06/13/19. Have not heard anything back. When I am back in the office later this week, I will call [employer] to f/u. If appeal denied, will work on [place] for [employer].   Catie Darnelle Maffucci, PharmD, [place], CPP Clinical Pharmacist [place] [phone]

## 2019-06-22 NOTE — Progress Notes (Signed)
I have reviewed the above note and agree. I was available to the pharmacist for consultation.  Tekisha Darcey, MD 

## 2019-06-23 ENCOUNTER — Ambulatory Visit: Payer: Self-pay | Admitting: Pharmacist

## 2019-06-23 DIAGNOSIS — R739 Hyperglycemia, unspecified: Secondary | ICD-10-CM

## 2019-06-23 DIAGNOSIS — R634 Abnormal weight loss: Secondary | ICD-10-CM

## 2019-06-23 DIAGNOSIS — Z6832 Body mass index (BMI) 32.0-32.9, adult: Secondary | ICD-10-CM

## 2019-06-23 NOTE — Progress Notes (Signed)
I have reviewed the above note and agree. I was available to the pharmacist for consultation.  Brittony Billick, MD 

## 2019-06-23 NOTE — Addendum Note (Signed)
Addended by: De Hollingshead on: 06/23/2019 04:19 PM   Modules accepted: Orders

## 2019-06-23 NOTE — Chronic Care Management (AMB) (Addendum)
Chronic Care Management   Follow Up Note   06/23/2019 Name: Christy Escobar MRN: [id number] DOB: Mar 23, 1950  Referred by: Einar Pheasant, MD Reason for referral : Chronic Care Management (Medication Management)   Christy Escobar is a 69 y.o. year old female who is a primary care patient of Einar Pheasant, MD. The CCM team was consulted for assistance with chronic disease management and care coordination needs.    Care coordination completed today   Review of patient status, including review of consultants reports, relevant laboratory and other test results, and collaboration with appropriate care team members and the patient's provider was performed as part of comprehensive patient evaluation and provision of chronic care management services.    SDOH (Social Determinants of Health) assessments performed: No See Care Plan activities for detailed interventions related to [facility])     Outpatient Encounter Medications as of 06/23/2019  Medication Sig  . estradiol (ESTRACE) 1 MG tablet Take 1 tablet by mouth once daily  . fish oil-omega-3 fatty acids 1000 MG capsule Take 1 g by mouth 3 (three) times daily.  . fluticasone (FLONASE) 50 MCG/ACT nasal spray USE TWO SPRAY(S) IN EACH NOSTRIL ONCE DAILY (Patient taking differently: daily as needed. USE TWO SPRAY(S) IN EACH NOSTRIL ONCE DAILY)  . Insulin Pen Needle (PEN NEEDLES) 32G X 4 MM MISC Use to inject Saxenda daily  . liraglutide (VICTOZA) 18 MG/3ML SOPN Inject 0.6 mg daily. Titrate as instructed. Max daily dose 1.8 mg.  . minoxidil (LONITEN) 2.5 MG tablet Take 2.5 mg by mouth daily.   Marland Kitchen omeprazole (PRILOSEC) 20 MG capsule Take 1 capsule (20 mg total) by mouth 2 (two) times daily before a meal.  . rosuvastatin (CRESTOR) 10 MG tablet Take 1 tablet by mouth once daily  . sertraline (ZOLOFT) 50 MG tablet Take 1 tablet by mouth once daily  . valACYclovir (VALTREX) 1000 MG tablet as needed.   . zolmitriptan (ZOMIG) 5 MG tablet TAKE 1/2 (ONE-HALF)  TABLET BY MOUTH ONCE DAILY AS NEEDED   No facility-administered encounter medications on file as of 06/23/2019.     Objective:   Goals Addressed              This Visit's Progress     Patient Stated   .  PharmD "I want to work on weight loss" (pt-stated)        [place] (see longitudinal plan of care for additional care plan information)  Current Barriers:  . 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 Saxenda appeal denied.  . Most recent home weight: 184 lbs . Current exercise: walks several times week, usually for 30-45 minutes at a time . Current weight management pharmacotherapy: none at this time  Pharmacist Clinical Goal(s):  Marland Kitchen Over the next 90 days, patient will work with PharmD and primary care provider to work towards 5-10% body weight loss  Interventions: . Comprehensive medication review performed, medication list updated in electronic medical record . Inter-disciplinary care team collaboration (see longitudinal plan of care) . Completed PA for Victoza. Received favorable response that Victoza was approved. Contacted [facility]. Victoza ran through for $50/30 day supply. She notes that this is not going to be affordable. Victoza no longer offers a coupon card to aid in prescription cost. However, Ozempic does. Ozempic has demonstrated benefit for weight loss. Mancel Parsons is not going to be covered by her  plan for similar reasons Saxenda was not, because weight loss medications are not covered. Patient agreeable to pursue coverage for Ozempic. Will complete PA and submit tomorrow.   Patient Self Care Activities:  . Patient will adhere to dietary modifications . Patient will target at least 150 minutes of moderate intensity exercise weekly . Patient will report any questions or concerns to provider   Please see  past updates related to this goal by clicking on the "Past Updates" button in the selected goal          Plan:  - Will continue to collaborate w/ patient as above  Catie Darnelle Maffucci, PharmD, [place], [place] Pharmacist [facility] [phone]

## 2019-06-23 NOTE — Patient Instructions (Addendum)
Visit Information  Goals Addressed              This Visit's Progress     Patient Stated   .  PharmD "I want to work on weight loss" (pt-stated)        [place] (see longitudinal plan of care for additional care plan information)  Current Barriers:  . 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 Saxenda appeal denied.  . Most recent home weight: 184 lbs . Current exercise: walks several times week, usually for 30-45 minutes at a time . Current weight management pharmacotherapy: none at this time  Pharmacist Clinical Goal(s):  Marland Kitchen Over the next 90 days, patient will work with PharmD and primary care provider to work towards 5-10% body weight loss  Interventions: . Comprehensive medication review performed, medication list updated in electronic medical record . Inter-disciplinary care team collaboration (see longitudinal plan of care) . Completed PA for Victoza. Received favorable response that Victoza was approved. Contacted [facility]. Victoza ran through for $50/30 day supply. She notes that this is not going to be affordable. Victoza no longer offers a coupon card to aid in prescription cost. However, Ozempic does. Ozempic has demonstrated benefit for weight loss. Mancel Parsons is not going to be covered by her plan for similar reasons Saxenda was not, because weight loss medications are not covered. Patient agreeable to pursue coverage for Ozempic. Will complete PA and submit tomorrow.   Patient Self Care Activities:  . Patient will adhere to dietary modifications . Patient will target at least 150 minutes of moderate intensity exercise weekly . Patient will report any questions or concerns to provider   Please see past updates related to this goal by clicking on the "Past Updates" button in the selected goal         The  patient verbalized understanding of instructions provided today and declined a print copy of patient instruction materials.   Plan:  - Will continue to collaborate w/ patient as above  Catie Darnelle Maffucci, PharmD, [place], [place] Pharmacist [place] [phone]

## 2019-06-24 ENCOUNTER — Ambulatory Visit: Payer: Managed Care, Other (non HMO) | Admitting: Pharmacist

## 2019-06-24 DIAGNOSIS — R739 Hyperglycemia, unspecified: Secondary | ICD-10-CM

## 2019-06-24 DIAGNOSIS — R634 Abnormal weight loss: Secondary | ICD-10-CM

## 2019-06-24 DIAGNOSIS — Z6832 Body mass index (BMI) 32.0-32.9, adult: Secondary | ICD-10-CM

## 2019-06-24 MED ORDER — OZEMPIC (0.25 OR 0.5 MG/DOSE) 2 MG/1.5ML ~~LOC~~ SOPN: PEN_INJECTOR | SUBCUTANEOUS | 2 refills | Status: DC

## 2019-06-24 NOTE — Patient Instructions (Signed)
Visit Information  Goals Addressed              This Visit's Progress     Patient Stated   .  PharmD "I want to work on weight loss" (pt-stated)        [place] (see longitudinal plan of care for additional care plan information)  Current Barriers:  . Obesity; complicated by chronic medical conditions including hyperglycemia (last A1c 5.9%); HLD; most recent BMI 32.6 (05/20/19), previous BMI 32.9 (04/05/19) o Saxenda appeal denied. Victoza too expensive on insurance and there is not a copay card available. Decided to pursue Ozempic coverage given weight loss data   . Most recent home weight: 184 lbs . Current exercise: walks several times week, usually for 30-45 minutes at a time . Current weight management pharmacotherapy: none at this time  Pharmacist Clinical Goal(s):  Marland Kitchen Over the next 90 days, patient will work with PharmD and primary care provider to work towards 5-10% body weight loss  Interventions: . Comprehensive medication review performed, medication list updated in electronic medical record . Inter-disciplinary care team collaboration (see longitudinal plan of care) . Start Ozempic 0.25 mg once weekly x 4 weeks, then increase to 0.5 mg weekly. Will titrate to 1 mg weekly as tolerated.  . Assisted patient in downloading savings card for [place]. Copay is $24.99/30 day supply. Once we have on stable dose, will send for 3 month supply, which should also be $24.99 with savings card.  . Patient denies any questions or concerns at this time. She is going to pick up Ozempic today and start the medication.  Patient Self Care Activities:  . Patient will adhere to dietary modifications . Patient will target at least 150 minutes of moderate intensity exercise weekly . Patient will report any questions or concerns to provider   Please see past updates related to this goal by clicking on the "Past Updates" button in the selected goal         The patient verbalized  understanding of instructions provided today and declined a print copy of patient instruction materials.   Plan:  - Will outreach in ~ 6 weeks as previously scheduled  Catie Darnelle Maffucci, PharmD, [place], [place] Pharmacist [facility] [phone]

## 2019-06-24 NOTE — Chronic Care Management (AMB) (Signed)
Chronic Care Management   Follow Up Note   06/24/2019 Name: Christy Escobar MRN: [id number] DOB: Nov 16, 1950  Referred by: Einar Pheasant, MD Reason for referral : Chronic Care Management (Medication Management)   Christy Escobar is a 69 y.o. year old female who is a primary care patient of Einar Pheasant, MD. The CCM team was consulted for assistance with chronic disease management and care coordination needs.    Contacted patient for medication management support.  Review of patient status, including review of consultants reports, relevant laboratory and other test results, and collaboration with appropriate care team members and the patient's provider was performed as part of comprehensive patient evaluation and provision of chronic care management services.    SDOH (Social Determinants of Health) assessments performed: Yes See Care Plan activities for detailed interventions related to [name]     Outpatient Encounter Medications as of 06/24/2019  Medication Sig  . estradiol (ESTRACE) 1 MG tablet Take 1 tablet by mouth once daily  . fish oil-omega-3 fatty acids 1000 MG capsule Take 1 g by mouth 3 (three) times daily.  . fluticasone (FLONASE) 50 MCG/ACT nasal spray USE TWO SPRAY(S) IN EACH NOSTRIL ONCE DAILY (Patient taking differently: daily as needed. USE TWO SPRAY(S) IN EACH NOSTRIL ONCE DAILY)  . minoxidil (LONITEN) 2.5 MG tablet Take 2.5 mg by mouth daily.   Marland Kitchen omeprazole (PRILOSEC) 20 MG capsule Take 1 capsule (20 mg total) by mouth 2 (two) times daily before a meal.  . rosuvastatin (CRESTOR) 10 MG tablet Take 1 tablet by mouth once daily  . Semaglutide,0.25 or 0.5MG /DOS, (OZEMPIC, 0.25 OR 0.5 MG/DOSE,) 2 MG/1.5ML SOPN Inject 0.25 mg weekly for 4 weeks, then increase to 0.5 mg weekly  . sertraline (ZOLOFT) 50 MG tablet Take 1 tablet by mouth once daily  . valACYclovir (VALTREX) 1000 MG tablet as needed.   . zolmitriptan (ZOMIG) 5 MG tablet TAKE 1/2 (ONE-HALF) TABLET BY MOUTH ONCE  DAILY AS NEEDED   No facility-administered encounter medications on file as of 06/24/2019.     Objective:   Goals Addressed              This Visit's Progress     Patient Stated   .  PharmD "I want to work on weight loss" (pt-stated)        [place] (see longitudinal plan of care for additional care plan information)  Current Barriers:  . Obesity; complicated by chronic medical conditions including hyperglycemia (last A1c 5.9%); HLD; most recent BMI 32.6 (05/20/19), previous BMI 32.9 (04/05/19) o Saxenda appeal denied. Victoza too expensive on insurance and there is not a copay card available. Decided to pursue Ozempic coverage given weight loss data   . Most recent home weight: 184 lbs . Current exercise: walks several times week, usually for 30-45 minutes at a time . Current weight management pharmacotherapy: none at this time  Pharmacist Clinical Goal(s):  Marland Kitchen Over the next 90 days, patient will work with PharmD and primary care provider to work towards 5-10% body weight loss  Interventions: . Comprehensive medication review performed, medication list updated in electronic medical record . Inter-disciplinary care team collaboration (see longitudinal plan of care) . Start Ozempic 0.25 mg once weekly x 4 weeks, then increase to 0.5 mg weekly. Will titrate to 1 mg weekly as tolerated.  . Assisted patient in downloading savings card for [place]. Copay is $24.99/30 day supply. Once we have on stable dose, will send for 3 month supply, which should also  be $24.99 with savings card.  . Patient denies any questions or concerns at this time.   Patient Self Care Activities:  . Patient will adhere to dietary modifications . Patient will target at least 150 minutes of moderate intensity exercise weekly . Patient will report any questions or concerns to provider   Please see past updates related to this goal by clicking on the "Past Updates" button in the selected goal           Plan:  - Will outreach in ~ 6 weeks as previously scheduled  Catie Darnelle Maffucci, PharmD, [place], [place] Pharmacist [facility] [phone]

## 2019-06-24 NOTE — Progress Notes (Signed)
I have reviewed the above note and agree. I was available to the pharmacist for consultation.  Raye Wiens, MD 

## 2019-06-30 ENCOUNTER — Telehealth: Payer: Self-pay | Admitting: Internal Medicine

## 2019-06-30 NOTE — Telephone Encounter (Signed)
Pt returned your call.  

## 2019-06-30 NOTE — Telephone Encounter (Signed)
lft vm for pt to call ofc regarding CT.

## 2019-07-01 NOTE — Telephone Encounter (Signed)
Good morning!  FYI pt CT chest w contrast you ordered was Approved. Pt is scheduled.

## 2019-07-08 ENCOUNTER — Ambulatory Visit: Payer: Managed Care, Other (non HMO)

## 2019-07-12 ENCOUNTER — Ambulatory Visit
Admission: RE | Admit: 2019-07-12 | Discharge: 2019-07-12 | Disposition: A | Discharge status: Home / Self Care | Payer: Managed Care, Other (non HMO) | Source: Ambulatory Visit | Attending: Internal Medicine | Admitting: Internal Medicine

## 2019-07-12 ENCOUNTER — Other Ambulatory Visit: Payer: Self-pay

## 2019-07-12 DIAGNOSIS — D869 Sarcoidosis, unspecified: Secondary | ICD-10-CM

## 2019-07-12 DIAGNOSIS — J189 Pneumonia, unspecified organism: Secondary | ICD-10-CM | POA: Diagnosis present

## 2019-07-12 DIAGNOSIS — R9389 Abnormal findings on diagnostic imaging of other specified body structures: Secondary | ICD-10-CM | POA: Diagnosis present

## 2019-07-12 LAB — POCT I-STAT CREATININE: Creatinine, Ser: 0.7 mg/dL (ref 0.44–1.00)

## 2019-07-12 MED ORDER — IOHEXOL 300 MG/ML  SOLN
75.0000 mL | Freq: Once | INTRAMUSCULAR | Status: AC | PRN
  Administered 2019-07-12: 75 mL via INTRAVENOUS

## 2019-07-13 ENCOUNTER — Telehealth: Payer: Self-pay | Admitting: Internal Medicine

## 2019-07-13 NOTE — Telephone Encounter (Signed)
See result note.  Recommend f/u with pulmonary - she sees Dr Raul Del.  See note for details.

## 2019-07-13 NOTE — Telephone Encounter (Signed)
I think you have already seen this but this is [place]

## 2019-07-13 NOTE — Telephone Encounter (Signed)
CT Scan Results are in [place], per [facility].

## 2019-07-14 NOTE — Telephone Encounter (Signed)
See result note.  

## 2019-07-25 ENCOUNTER — Ambulatory Visit: Payer: Managed Care, Other (non HMO) | Admitting: Pharmacist

## 2019-07-25 VITALS — Wt 173.0 lb

## 2019-07-25 DIAGNOSIS — R634 Abnormal weight loss: Secondary | ICD-10-CM

## 2019-07-25 DIAGNOSIS — Z6832 Body mass index (BMI) 32.0-32.9, adult: Secondary | ICD-10-CM

## 2019-07-25 DIAGNOSIS — R739 Hyperglycemia, unspecified: Secondary | ICD-10-CM

## 2019-07-25 MED ORDER — OZEMPIC (1 MG/DOSE) 2 MG/1.5ML ~~LOC~~ SOPN: 1.0000 mg | PEN_INJECTOR | SUBCUTANEOUS | 3 refills | Status: DC

## 2019-07-25 NOTE — Patient Instructions (Signed)
Visit Information  Goals Addressed              This Visit's Progress     Patient Stated   .  PharmD "I want to work on weight loss" (pt-stated)        [place] (see longitudinal plan of care for additional care plan information)  Current Barriers:  . Obesity; complicated by chronic medical conditions including hyperglycemia (last A1c 5.9%); HLD; most recent BMI 32.6 (05/20/19), previous BMI 32.9 (04/05/19); Attempted Saxenda therapy, but weight loss medications excluded from patient's insurance coverage. Victoza too expensive on insurance and there is not a copay card available. Decided to pursue Ozempic coverage given weight loss data. Started Ozempic therapy ~06/24/19 . Most recent home weight: 173 lbs o Current weight loss total on therapy: 11 lbs . Current exercise: walks several times week, usually for 30-45 minutes at a time . Current weight management pharmacotherapy: Ozempic 0.5 mg x 2 weeks o Denies any stomach upset, nausea, vomiting since starting therapy   Pharmacist Clinical Goal(s):  Marland Kitchen Over the next 90 days, patient will work with PharmD and primary care provider to work towards 5-10% body weight loss  Interventions: . Comprehensive medication review performed, medication list updated in electronic medical record . Inter-disciplinary care team collaboration (see longitudinal plan of care) . Praised for weight loss with Ozempic. Discussed potential to increase to 1 mg weekly. Patient amenable. Complete current supply of 0.5 mg weekly, then increase to 1 mg weekly. Patient aware to contact me if significant nausea/vomiting develops and we can discuss reducing dose.  . F/u with PCP in 4 weeks, me in ~8 weeks  Patient Self Care Activities:  . Patient will adhere to dietary modifications . Patient will target at least 150 minutes of moderate intensity exercise weekly . Patient will report any questions or concerns to provider   Please see past updates related to  this goal by clicking on the "Past Updates" button in the selected goal         The patient verbalized understanding of instructions provided today and declined a print copy of patient instruction materials.   Plan:  - Scheduled f/u in ~ 8 weeks  Catie Darnelle Maffucci, PharmD, [place], [place] Pharmacist [place] [phone]

## 2019-07-25 NOTE — Chronic Care Management (AMB) (Signed)
Chronic Care Management   Follow Up Note   07/25/2019 Name: MICHAELYN WALL MRN: [id number] DOB: [birth year]  Referred by: Einar Pheasant, MD Reason for referral : Chronic Care Management (Medication Management)   BLAKLEY MICHNA is a 69 y.o. year old female who is a primary care patient of Einar Pheasant, MD. The CCM team was consulted for assistance with chronic disease management and care coordination needs.    Patient contacted me requesting 90 day supply of medication, as Ozempic copay card notes that either a 30 or 90 day supply will be $24.99.   Review of patient status, including review of consultants reports, relevant laboratory and other test results, and collaboration with appropriate care team members and the patient's provider was performed as part of comprehensive patient evaluation and provision of chronic care management services.    SDOH (Social Determinants of Health) assessments performed: No See Care Plan activities for detailed interventions related to [facility])     Outpatient Encounter Medications as of 07/25/2019  Medication Sig  . Semaglutide,0.25 or 0.5MG /DOS, (OZEMPIC, 0.25 OR 0.5 MG/DOSE,) 2 MG/1.5ML SOPN Inject 0.25 mg weekly for 4 weeks, then increase to 0.5 mg weekly  . estradiol (ESTRACE) 1 MG tablet Take 1 tablet by mouth once daily  . fish oil-omega-3 fatty acids 1000 MG capsule Take 1 g by mouth 3 (three) times daily.  . fluticasone (FLONASE) 50 MCG/ACT nasal spray USE TWO SPRAY(S) IN EACH NOSTRIL ONCE DAILY (Patient taking differently: daily as needed. USE TWO SPRAY(S) IN EACH NOSTRIL ONCE DAILY)  . minoxidil (LONITEN) 2.5 MG tablet Take 2.5 mg by mouth daily.   Marland Kitchen omeprazole (PRILOSEC) 20 MG capsule Take 1 capsule (20 mg total) by mouth 2 (two) times daily before a meal.  . rosuvastatin (CRESTOR) 10 MG tablet Take 1 tablet by mouth once daily  . sertraline (ZOLOFT) 50 MG tablet Take 1 tablet by mouth once daily  . valACYclovir (VALTREX) 1000 MG tablet as  needed.   . zolmitriptan (ZOMIG) 5 MG tablet TAKE 1/2 (ONE-HALF) TABLET BY MOUTH ONCE DAILY AS NEEDED   No facility-administered encounter medications on file as of 07/25/2019.     Objective:   Goals Addressed              This Visit's Progress     Patient Stated   .  PharmD "I want to work on weight loss" (pt-stated)        [place] (see longitudinal plan of care for additional care plan information)  Current Barriers:  . Obesity; complicated by chronic medical conditions including hyperglycemia (last A1c 5.9%); HLD; most recent BMI 32.6 (05/20/19), previous BMI 32.9 (04/05/19); Attempted Saxenda therapy, but weight loss medications excluded from patient's insurance coverage. Victoza too expensive on insurance and there is not a copay card available. Decided to pursue Ozempic coverage given weight loss data. Started Ozempic therapy ~06/24/19 . Most recent home weight: 173 lbs o Current weight loss total on therapy: 11 lbs . Current exercise: walks several times week, usually for 30-45 minutes at a time . Current weight management pharmacotherapy: Ozempic 0.5 mg x 2 weeks o Denies any stomach upset, nausea, vomiting since starting therapy   Pharmacist Clinical Goal(s):  Marland Kitchen Over the next 90 days, patient will work with PharmD and primary care provider to work towards 5-10% body weight loss  Interventions: . Comprehensive medication review performed, medication list updated in electronic medical record . Inter-disciplinary care team collaboration (see longitudinal plan of care) . Praised  for weight loss with Ozempic. Discussed potential to increase to 1 mg weekly. Patient amenable. Complete current supply of 0.5 mg weekly, then increase to 1 mg weekly. Patient aware to contact me if significant nausea/vomiting develops and we can discuss reducing dose.  . F/u with PCP in 4 weeks, me in ~8 weeks  Patient Self Care Activities:  . Patient will adhere to dietary  modifications . Patient will target at least 150 minutes of moderate intensity exercise weekly . Patient will report any questions or concerns to provider   Please see past updates related to this goal by clicking on the "Past Updates" button in the selected goal          Plan:  - Scheduled f/u in ~ 8 weeks  Catie Darnelle Maffucci, PharmD, [place], [place] Pharmacist [place] [place] [phone]

## 2019-07-26 NOTE — Progress Notes (Signed)
Reviewed above information.  Agree with assessment and plan.    Dr Chrisma Hurlock 

## 2019-08-02 ENCOUNTER — Telehealth: Payer: Self-pay

## 2019-08-02 NOTE — Chronic Care Management (AMB) (Signed)
  Care Management   Note  08/02/2019 Name: [name] MRN: [id number] DOB: 1950/02/04  Christy Escobar is a 69 y.o. year old female who is a primary care patient of Einar Pheasant, MD and is actively engaged with the care management team. I reached out to [name] by phone today to assist with re-scheduling a follow up visit with the Pharmacist  Follow up plan: Telephone appointment with care management team member scheduled for:09/21/2019  Noreene Larsson, [place], [place], [place] [address] Direct Dial: [phone] [email or web address]: [email or web address]

## 2019-08-03 ENCOUNTER — Other Ambulatory Visit: Payer: Self-pay | Admitting: Internal Medicine

## 2019-08-04 ENCOUNTER — Telehealth: Payer: Managed Care, Other (non HMO)

## 2019-08-04 ENCOUNTER — Ambulatory Visit: Payer: Managed Care, Other (non HMO) | Admitting: Pharmacist

## 2019-08-04 DIAGNOSIS — Z6832 Body mass index (BMI) 32.0-32.9, adult: Secondary | ICD-10-CM

## 2019-08-04 DIAGNOSIS — R739 Hyperglycemia, unspecified: Secondary | ICD-10-CM

## 2019-08-04 NOTE — Patient Instructions (Signed)
Visit Information  Goals Addressed              This Visit's Progress     Patient Stated   .  PharmD "I want to work on weight loss" (pt-stated)        [place] (see longitudinal plan of care for additional care plan information)  Current Barriers:  . Obesity; complicated by chronic medical conditions including hyperglycemia (last A1c 5.9%); HLD; most recent BMI 32.6 (05/20/19), previous BMI 32.9 (04/05/19); Weight loss therapy excluded from her plan coverage. Started on Ozempic 06/24/19.  Marland Kitchen Per manufacturer savings card, Ozempic should be ~$25 for either 30 or 90 day supply. Patient called me earlier this week noting that she was told by the pharmacy that her insurance would not cover a 90 day supply . Most recent home weight: 173 lbs o Current weight loss total on therapy: 11 lbs . Current exercise: walks several times week, usually for 30-45 minutes at a time . Current weight management pharmacotherapy: Ozempic 1 mg weekly -  o Denies any stomach upset, nausea, vomiting since starting therapy   Pharmacist Clinical Goal(s):  Marland Kitchen Over the next 90 days, patient will work with PharmD and primary care provider to work towards 5-10% body weight loss  Interventions: . Comprehensive medication review performed, medication list updated in electronic medical record . Inter-disciplinary care team collaboration (see longitudinal plan of care) . Contacted [employer]. They noted that a 90 day supply should run through the insurance with no concerns. Contacted [facility], however, as patient picked up a 28 day supply on 07/25/19, it is too soon for them to run a 90 day supply. Will f/u next month. Updated patient  Patient Self Care Activities:  . Patient will adhere to dietary modifications . Patient will target at least 150 minutes of moderate intensity exercise weekly . Patient will report any questions or concerns to provider   Please see past updates related to this goal by clicking on the  "Past Updates" button in the selected goal         The patient verbalized understanding of instructions provided today and declined a print copy of patient instruction materials.   Plan:  - Will outreach next month as previously scheduled  Catie Darnelle Maffucci, PharmD, [place], [place] Pharmacist [facility] [phone]

## 2019-08-04 NOTE — Chronic Care Management (AMB) (Signed)
Chronic Care Management   Follow Up Note   08/04/2019 Name: [name] MRN: [id number] DOB: 1950-02-12  Referred by: Christy Pheasant, MD Reason for referral : Chronic Care Management (Medication Management)   Christy Escobar is a 69 y.o. year old female who is a primary care patient of Christy Pheasant, MD. The CCM team was consulted for assistance with chronic disease management and care coordination needs.    Patient called with medication access concerns  Review of patient status, including review of consultants reports, relevant laboratory and other test results, and collaboration with appropriate care team members and the patient's provider was performed as part of comprehensive patient evaluation and provision of chronic care management services.    SDOH (Social Determinants of Health) assessments performed: Yes See Care Plan activities for detailed interventions related to SDOH)  SDOH Interventions     Most Recent Value  SDOH Interventions  Financial Strain Interventions Other (Comment)  [collaboration w/ insurance]       Outpatient Encounter Medications as of 08/04/2019  Medication Sig   estradiol (ESTRACE) 1 MG tablet Take 1 tablet by mouth once daily   fish oil-omega-3 fatty acids 1000 MG capsule Take 1 g by mouth 3 (three) times daily.   fluticasone (FLONASE) 50 MCG/ACT nasal spray USE TWO SPRAY(S) IN EACH NOSTRIL ONCE DAILY (Patient taking differently: daily as needed. USE TWO SPRAY(S) IN EACH NOSTRIL ONCE DAILY)   minoxidil (LONITEN) 2.5 MG tablet Take 2.5 mg by mouth daily.    omeprazole (PRILOSEC) 20 MG capsule Take 1 capsule (20 mg total) by mouth 2 (two) times daily before a meal.   rosuvastatin (CRESTOR) 10 MG tablet Take 1 tablet by mouth once daily   Semaglutide, 1 MG/DOSE, (OZEMPIC, 1 MG/DOSE,) 2 MG/1.5ML SOPN Inject 0.75 mLs (1 mg total) into the skin once a week.   sertraline (ZOLOFT) 50 MG tablet Take 1 tablet by mouth once daily   valACYclovir  (VALTREX) 1000 MG tablet as needed.    zolmitriptan (ZOMIG) 5 MG tablet TAKE 1/2 (ONE-HALF) TABLET BY MOUTH ONCE DAILY AS NEEDED   No facility-administered encounter medications on file as of 08/04/2019.     Objective:   Goals Addressed              This Visit's Progress     Patient Stated     PharmD "I want to work on weight loss" (pt-stated)        CARE PLAN ENTRY (see longitudinal plan of care for additional care plan information)  Current Barriers:   Obesity; complicated by chronic medical conditions including hyperglycemia (last A1c 5.9%); HLD; most recent BMI 32.6 (05/20/19), previous BMI 32.9 (04/05/19); Weight loss therapy excluded from her plan coverage. Started on Ozempic 06/24/19.   Per manufacturer savings card, Ozempic should be ~$25 for either 30 or 90 day supply. Patient called me earlier this week noting that she was told by the pharmacy that her insurance would not cover a 90 day supply  Most recent home weight: 173 lbs o Current weight loss total on therapy: 11 lbs  Current exercise: walks several times week, usually for 30-45 minutes at a time  Current weight management pharmacotherapy: Ozempic 1 mg weekly -  o Denies any stomach upset, nausea, vomiting since starting therapy   Pharmacist Clinical Goal(s):   Over the next 90 days, patient will work with PharmD and primary care provider to work towards 5-10% body weight loss  Interventions:  Comprehensive medication review performed, medication list  updated in electronic medical record  Inter-disciplinary care team collaboration (see longitudinal plan of care)  Contacted [employer]. They noted that a 90 day supply should run through the insurance with no concerns. Contacted [facility], however, as patient picked up a 28 day supply on 07/25/19, it is too soon for them to run a 90 day supply. Will f/u next month. Updated patient  Patient Self Care Activities:   Patient will adhere to dietary  modifications  Patient will target at least 150 minutes of moderate intensity exercise weekly  Patient will report any questions or concerns to provider   Please see past updates related to this goal by clicking on the "Past Updates" button in the selected goal          Plan:  - Will outreach next month as previously scheduled  Catie Darnelle Maffucci, PharmD, [place], [place] Pharmacist [place] [phone]

## 2019-08-05 NOTE — Progress Notes (Signed)
I have reviewed the above note and agree. I was available to the pharmacist for consultation.  Karsyn Jamie, MD 

## 2019-08-23 ENCOUNTER — Other Ambulatory Visit: Payer: Self-pay

## 2019-08-23 ENCOUNTER — Encounter: Payer: Self-pay | Admitting: Internal Medicine

## 2019-08-23 ENCOUNTER — Ambulatory Visit: Payer: Managed Care, Other (non HMO) | Admitting: Internal Medicine

## 2019-08-23 DIAGNOSIS — R739 Hyperglycemia, unspecified: Secondary | ICD-10-CM | POA: Diagnosis not present

## 2019-08-23 DIAGNOSIS — M5441 Lumbago with sciatica, right side: Secondary | ICD-10-CM

## 2019-08-23 DIAGNOSIS — F439 Reaction to severe stress, unspecified: Secondary | ICD-10-CM

## 2019-08-23 DIAGNOSIS — R7989 Other specified abnormal findings of blood chemistry: Secondary | ICD-10-CM

## 2019-08-23 DIAGNOSIS — E7800 Pure hypercholesterolemia, unspecified: Secondary | ICD-10-CM

## 2019-08-23 DIAGNOSIS — J189 Pneumonia, unspecified organism: Secondary | ICD-10-CM

## 2019-08-23 DIAGNOSIS — D860 Sarcoidosis of lung: Secondary | ICD-10-CM

## 2019-08-23 DIAGNOSIS — K219 Gastro-esophageal reflux disease without esophagitis: Secondary | ICD-10-CM

## 2019-08-23 DIAGNOSIS — R945 Abnormal results of liver function studies: Secondary | ICD-10-CM
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
## 2019-08-23 NOTE — Patient Instructions (Signed)
Appointment with Dr Raul Del 08/25/19 - 3:30 pm.

## 2019-08-25 ENCOUNTER — Telehealth: Payer: Managed Care, Other (non HMO)

## 2019-08-29 ENCOUNTER — Encounter: Payer: Self-pay | Admitting: Internal Medicine

## 2019-08-29 DIAGNOSIS — M545 Low back pain, unspecified: Secondary | ICD-10-CM | POA: Insufficient documentation

## 2019-08-29 NOTE — Assessment & Plan Note (Signed)
Chest CT as outlined.  Persistent changes.  Symptoms have improved.  Schedule f/u with Dr Raul Del for further evaluation with question of need for bronchoscopy, etc.

## 2019-08-29 NOTE — Assessment & Plan Note (Signed)
Felt to be due to fatty liver.  Has lost weight.  Continue diet and exercise.  Follow liver function tests.

## 2019-08-29 NOTE — Assessment & Plan Note (Signed)
On crestor.  Low cholesterol diet and exercise.  Follow lipid panel and liver function tests.   

## 2019-08-29 NOTE — Assessment & Plan Note (Signed)
Low carb diet and exercise.  Follow met b and a1c.  

## 2019-08-29 NOTE — Assessment & Plan Note (Signed)
Low back pain and pain into right hip.  Refer to physical therapy for further evaluation and treatment.

## 2019-08-29 NOTE — Assessment & Plan Note (Signed)
History of sarcoid.  Has been seeing Dr Raul Del.  Recent CT as outlined.  Needs f/u with pulmonary.  No sob now.  Breathing stable.

## 2019-08-29 NOTE — Assessment & Plan Note (Signed)
On zoloft.  Stable.  Follow.  

## 2019-08-29 NOTE — Assessment & Plan Note (Signed)
Some recent burping - acid and bad taste.  Omeprazole.  Follow.

## 2019-09-08 ENCOUNTER — Encounter: Payer: Self-pay | Admitting: Internal Medicine

## 2019-09-09 NOTE — Telephone Encounter (Signed)
Confirm that pt has not had any covid vaccine.  If no, yes I do recommend getting the vaccine.  The only true contraindication is if allergic to the vaccine or any of its ingredients.  [employer]izer or [employer] - either ok.

## 2019-09-13 ENCOUNTER — Other Ambulatory Visit: Payer: Self-pay | Admitting: Pulmonary Disease

## 2019-09-13 DIAGNOSIS — R918 Other nonspecific abnormal finding of lung field: Secondary | ICD-10-CM

## 2019-09-13 DIAGNOSIS — D869 Sarcoidosis, unspecified: Secondary | ICD-10-CM

## 2019-09-20 ENCOUNTER — Telehealth: Payer: Managed Care, Other (non HMO)

## 2019-09-21 ENCOUNTER — Ambulatory Visit: Payer: Managed Care, Other (non HMO) | Admitting: Pharmacist

## 2019-09-21 VITALS — Wt 168.0 lb

## 2019-09-21 DIAGNOSIS — R739 Hyperglycemia, unspecified: Secondary | ICD-10-CM

## 2019-09-21 DIAGNOSIS — E7800 Pure hypercholesterolemia, unspecified: Secondary | ICD-10-CM

## 2019-09-21 DIAGNOSIS — Z6829 Body mass index (BMI) 29.0-29.9, adult: Secondary | ICD-10-CM

## 2019-09-21 MED ORDER — ROSUVASTATIN CALCIUM 10 MG PO TABS: 10.0000 mg | ORAL_TABLET | Freq: Every day | ORAL | 3 refills | Status: DC

## 2019-09-21 NOTE — Chronic Care Management (AMB) (Signed)
Chronic Care Management   Follow Up Note   09/21/2019 Name: JESSELLE LAFLAMME MRN: [id number] DOB: [birth year]  Referred by: Einar Pheasant, MD Reason for referral : Chronic Care Management (Medication Management)   AZARAH DACY is a 69 y.o. year old female who is a primary care patient of Einar Pheasant, MD. The CCM team was consulted for assistance with chronic disease management and care coordination needs.    Contacted patient for medication management review .  Review of patient status, including review of consultants reports, relevant laboratory and other test results, and collaboration with appropriate care team members and the patient's provider was performed as part of comprehensive patient evaluation and provision of chronic care management services.    SDOH (Social Determinants of Health) assessments performed: Yes See Care Plan activities for detailed interventions related to SDOH)  SDOH Interventions     Most Recent Value  SDOH Interventions  Financial Strain Interventions Intervention Not Indicated       Outpatient Encounter Medications as of 09/21/2019  Medication Sig  . fish oil-omega-3 fatty acids 1000 MG capsule Take 1 g by mouth 3 (three) times daily.  . fluticasone (FLONASE) 50 MCG/ACT nasal spray USE TWO SPRAY(S) IN EACH NOSTRIL ONCE DAILY (Patient taking differently: daily as needed. USE TWO SPRAY(S) IN EACH NOSTRIL ONCE DAILY)  . minoxidil (LONITEN) 2.5 MG tablet Take 1.25 mg by mouth daily.   Marland Kitchen omeprazole (PRILOSEC) 20 MG capsule Take 1 capsule (20 mg total) by mouth 2 (two) times daily before a meal.  . rosuvastatin (CRESTOR) 10 MG tablet Take 1 tablet (10 mg total) by mouth daily.  . Semaglutide, 1 MG/DOSE, (OZEMPIC, 1 MG/DOSE,) 2 MG/1.5ML SOPN Inject 0.75 mLs (1 mg total) into the skin once a week.  . sertraline (ZOLOFT) 50 MG tablet Take 1 tablet by mouth once daily  . [DISCONTINUED] estradiol (ESTRACE) 1 MG tablet Take 1 tablet by mouth once daily  .  [DISCONTINUED] rosuvastatin (CRESTOR) 10 MG tablet Take 1 tablet by mouth once daily  . finasteride (PROSCAR) 5 MG tablet Take 2.5 mg by mouth daily.  . valACYclovir (VALTREX) 1000 MG tablet as needed.  (Patient not taking: Reported on 09/21/2019)  . zolmitriptan (ZOMIG) 5 MG tablet TAKE 1/2 (ONE-HALF) TABLET BY MOUTH ONCE DAILY AS NEEDED (Patient not taking: Reported on 09/21/2019)   No facility-administered encounter medications on file as of 09/21/2019.     Objective:   Goals Addressed              This Visit's Progress     Patient Stated   .  PharmD "I want to work on weight loss" (pt-stated)        [place] (see longitudinal plan of care for additional care plan information)  Current Barriers:  . Social, financial, and community barriers:  o Patient reports concern w/ insurance declining coverage of PET scan for further evaluation of lungs. Worries about next steps.  o Reports she received her first [place] vaccine on 09/09/19.  . Obesity; complicated by chronic medical conditions including hyperglycemia (last A1c 5.9%); HLD; most recent BMI 32.6 (05/20/19); Weight loss therapy excluded from her plan coverage. Started on Ozempic 06/24/19.  o Baseline weight prior to therapy: 184 lbs o Most recent home weight: 168 lbs  o Current weight loss total on therapy: 16 lbs . Current exercise: walks 4-5 days weekly for 30-45 minutes. . Current weight management pharmacotherapy: Ozempic 1 mg weekly  o Patient confirms she was able  to fill 90 day supply for $25 . HLD: rosuvastatin 10 mg daily prescribed, but no recent fills. Last LDL uncontrolled. 10 year ASCVD risk 9% . Depression: sertraline 50 mg daily . Migraines: zolmitriptan 5 mg PRN migraines, patient reports she has not needed recently  . Dermatologic conditions: minoxidil 1.25 mg daily, finasteride 2.5 mg daily   Pharmacist Clinical Goal(s):  Marland Kitchen Over the next 90 days, patient will work with PharmD and primary care  provider to work towards 5-10% body weight loss  Interventions: . Comprehensive medication review performed, medication list updated in electronic medical record . Inter-disciplinary care team collaboration (see longitudinal plan of care) . Praised for continued adherence to therapy and continued weight loss. Encouraged continued focus on dietary and lifestyle modifications.  . Discussed rosuvastatin therapy. Refill provided. Encouraged adherence for primary prevention of ASCVD.  Marland Kitchen Praised for attaining first COVID vaccine. Encouraged to complete series as scheduled.  . Encouraged to contact pulmonary office for next steps, as PET scan canceled  Patient Self Care Activities:  . Patient will adhere to dietary modifications . Patient will target at least 150 minutes of moderate intensity exercise weekly . Patient will report any questions or concerns to provider   Please see past updates related to this goal by clicking on the "Past Updates" button in the selected goal          Plan:  - Scheduled f/u call in ~ 10 weeks (PCP f/u in ~8 weeks)  Catie Darnelle Maffucci, PharmD, [place], CPP Clinical Pharmacist [place] [phone]

## 2019-09-21 NOTE — Patient Instructions (Signed)
Visit Information  Goals Addressed              This Visit's Progress     Patient Stated     PharmD "I want to work on weight loss" (pt-stated)        [place] (see longitudinal plan of care for additional care plan information)  Current Barriers:   Social, financial, and community barriers:  o Patient reports concern w/ insurance declining coverage of PET scan for further evaluation of lungs. Worries about next steps.  o Reports she received her first [place] vaccine on 09/09/19.   Obesity; complicated by chronic medical conditions including hyperglycemia (last A1c 5.9%); HLD; most recent BMI 32.6 (05/20/19); Weight loss therapy excluded from her plan coverage. Started on Ozempic 06/24/19.  o Baseline weight prior to therapy: 184 lbs o Most recent home weight: 168 lbs  o Current weight loss total on therapy: 16 lbs  Current exercise: walks 4-5 days weekly for 30-45 minutes.  Current weight management pharmacotherapy: Ozempic 1 mg weekly  o Patient confirms she was able to fill 90 day supply for $25  HLD: rosuvastatin 10 mg daily prescribed, but no recent fills. Last LDL uncontrolled. 10 year ASCVD risk 9%  Depression: sertraline 50 mg daily  Migraines: zolmitriptan 5 mg PRN migraines, patient reports she has not needed recently   Dermatologic conditions: minoxidil 1.25 mg daily, finasteride 2.5 mg daily   Pharmacist Clinical Goal(s):   Over the next 90 days, patient will work with PharmD and primary care provider to work towards 5-10% body weight loss  Interventions:  Comprehensive medication review performed, medication list updated in electronic medical record  Inter-disciplinary care team collaboration (see longitudinal plan of care)  Praised for continued adherence to therapy and continued weight loss. Encouraged continued focus on dietary and lifestyle modifications.   Discussed rosuvastatin therapy. Refill provided. Encouraged adherence for primary  prevention of ASCVD.   Praised for attaining first COVID vaccine. Encouraged to complete series as scheduled.   Encouraged to contact pulmonary office for next steps, as PET scan canceled  Patient Self Care Activities:   Patient will adhere to dietary modifications  Patient will target at least 150 minutes of moderate intensity exercise weekly  Patient will report any questions or concerns to provider   Please see past updates related to this goal by clicking on the "Past Updates" button in the selected goal         The patient verbalized understanding of instructions provided today and declined a print copy of patient instruction materials.   Plan:  - Scheduled f/u call in ~ 10 weeks (PCP f/u in ~8 weeks)  Catie Darnelle Maffucci, PharmD, [place], [place] Pharmacist [place] [phone]

## 2019-10-05 ENCOUNTER — Other Ambulatory Visit: Payer: Self-pay

## 2019-10-05 ENCOUNTER — Ambulatory Visit: Payer: Managed Care, Other (non HMO) | Attending: Internal Medicine | Admitting: Physical Therapy

## 2019-10-05 DIAGNOSIS — G8929 Other chronic pain: Secondary | ICD-10-CM | POA: Diagnosis present

## 2019-10-05 DIAGNOSIS — M545 Low back pain: Secondary | ICD-10-CM | POA: Diagnosis present

## 2019-10-05 DIAGNOSIS — M25651 Stiffness of right hip, not elsewhere classified: Secondary | ICD-10-CM | POA: Diagnosis present

## 2019-10-05 DIAGNOSIS — M25551 Pain in right hip: Secondary | ICD-10-CM | POA: Diagnosis not present
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
## 2019-10-09 ENCOUNTER — Other Ambulatory Visit: Payer: Self-pay | Admitting: Internal Medicine

## 2019-10-12 ENCOUNTER — Ambulatory Visit: Payer: Managed Care, Other (non HMO) | Admitting: Physical Therapy

## 2019-10-12 ENCOUNTER — Other Ambulatory Visit: Payer: Self-pay

## 2019-10-17 ENCOUNTER — Encounter: Payer: Self-pay | Admitting: Internal Medicine

## 2019-10-18 ENCOUNTER — Other Ambulatory Visit: Payer: Self-pay

## 2019-10-18 ENCOUNTER — Ambulatory Visit: Payer: Managed Care, Other (non HMO) | Attending: Internal Medicine | Admitting: Physical Therapy

## 2019-10-18 ENCOUNTER — Encounter: Payer: Self-pay | Admitting: Internal Medicine

## 2019-10-18 DIAGNOSIS — M25651 Stiffness of right hip, not elsewhere classified: Secondary | ICD-10-CM | POA: Diagnosis present

## 2019-10-18 DIAGNOSIS — M545 Low back pain, unspecified: Secondary | ICD-10-CM | POA: Insufficient documentation

## 2019-10-18 DIAGNOSIS — G8929 Other chronic pain: Secondary | ICD-10-CM | POA: Insufficient documentation

## 2019-10-18 DIAGNOSIS — M25551 Pain in right hip: Secondary | ICD-10-CM | POA: Diagnosis not present

## 2019-10-18 MED ORDER — ESTRADIOL 1 MG PO TABS
1.0000 mg | ORAL_TABLET | Freq: Every day | ORAL | 0 refills | Status: DC
Start: 2019-10-18 — End: 2019-10-18

## 2019-10-18 MED ORDER — ESTRADIOL 1 MG PO TABS
1.0000 mg | ORAL_TABLET | Freq: Every day | ORAL | 0 refills | Status: DC
Start: 2019-10-18 — End: 2020-06-18

## 2019-10-18 NOTE — Telephone Encounter (Signed)
rx placed in box to fax.
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
## 2019-10-19 ENCOUNTER — Encounter: Payer: Managed Care, Other (non HMO) | Admitting: Physical Therapy

## 2019-10-26 ENCOUNTER — Encounter: Payer: Self-pay | Admitting: Physical Therapy

## 2019-10-26 ENCOUNTER — Other Ambulatory Visit: Payer: Self-pay | Admitting: Internal Medicine

## 2019-10-26 ENCOUNTER — Other Ambulatory Visit: Payer: Self-pay

## 2019-10-26 ENCOUNTER — Ambulatory Visit: Payer: Managed Care, Other (non HMO) | Admitting: Physical Therapy

## 2019-10-26 DIAGNOSIS — M25551 Pain in right hip: Secondary | ICD-10-CM | POA: Diagnosis not present

## 2019-10-26 DIAGNOSIS — Z1231 Encounter for screening mammogram for malignant neoplasm of breast: Secondary | ICD-10-CM

## 2019-10-26 DIAGNOSIS — G8929 Other chronic pain: Secondary | ICD-10-CM

## 2019-10-26 DIAGNOSIS — M545 Low back pain, unspecified: Secondary | ICD-10-CM

## 2019-10-26 DIAGNOSIS — M25651 Stiffness of right hip, not elsewhere classified: Secondary | ICD-10-CM
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
## 2019-11-02 ENCOUNTER — Encounter: Payer: Self-pay | Admitting: Physical Therapy

## 2019-11-02 ENCOUNTER — Ambulatory Visit: Payer: Managed Care, Other (non HMO) | Admitting: Physical Therapy

## 2019-11-02 DIAGNOSIS — M25651 Stiffness of right hip, not elsewhere classified: Secondary | ICD-10-CM

## 2019-11-02 DIAGNOSIS — M545 Low back pain, unspecified: Secondary | ICD-10-CM

## 2019-11-02 DIAGNOSIS — M25551 Pain in right hip: Secondary | ICD-10-CM

## 2019-11-02 DIAGNOSIS — G8929 Other chronic pain: Secondary | ICD-10-CM
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
## 2019-11-09 ENCOUNTER — Other Ambulatory Visit: Payer: Managed Care, Other (non HMO)

## 2019-11-10 ENCOUNTER — Encounter: Payer: Managed Care, Other (non HMO) | Admitting: Physical Therapy

## 2019-11-10 ENCOUNTER — Telehealth (INDEPENDENT_AMBULATORY_CARE_PROVIDER_SITE_OTHER): Payer: Managed Care, Other (non HMO) | Admitting: Internal Medicine

## 2019-11-10 ENCOUNTER — Other Ambulatory Visit: Payer: Self-pay

## 2019-11-10 VITALS — HR 78 | Resp 18

## 2019-11-10 DIAGNOSIS — R0981 Nasal congestion: Secondary | ICD-10-CM | POA: Diagnosis not present

## 2019-11-10 DIAGNOSIS — R0989 Other specified symptoms and signs involving the circulatory and respiratory systems: Secondary | ICD-10-CM
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
## 2019-11-11 ENCOUNTER — Encounter: Payer: Self-pay | Admitting: Internal Medicine

## 2019-11-12 LAB — NOVEL CORONAVIRUS, NAA: SARS-CoV-2, NAA: NOT DETECTED

## 2019-11-12 LAB — SARS-COV-2, NAA 2 DAY TAT

## 2019-11-15 ENCOUNTER — Encounter: Payer: Managed Care, Other (non HMO) | Admitting: Physical Therapy

## 2019-11-20 ENCOUNTER — Encounter: Payer: Self-pay | Admitting: Internal Medicine

## 2019-11-20 DIAGNOSIS — R0981 Nasal congestion: Secondary | ICD-10-CM | POA: Insufficient documentation

## 2019-11-20 NOTE — Assessment & Plan Note (Signed)
Symptoms started with sore throat and congestion.  Increased sinus congestion and drainage.  Taking mucinex and nyquil.  Discussed possible covid.  She has been out of work this week.  Will schedule for covid test.  Remain quarantined.  Saline nasal spray, steroid nasal spray as directed.  Continue mucinex.  Follow symptoms.  Notify me if any worsening symptoms or problems.

## 2019-11-23 ENCOUNTER — Encounter: Payer: Managed Care, Other (non HMO) | Admitting: Physical Therapy

## 2019-11-24 ENCOUNTER — Ambulatory Visit (INDEPENDENT_AMBULATORY_CARE_PROVIDER_SITE_OTHER): Payer: Managed Care, Other (non HMO) | Admitting: Internal Medicine

## 2019-11-24 ENCOUNTER — Other Ambulatory Visit: Payer: Self-pay

## 2019-11-24 VITALS — BP 126/70 | HR 74 | Temp 98.1°F | Resp 16 | Ht 63.0 in | Wt 163.0 lb

## 2019-11-24 DIAGNOSIS — E7800 Pure hypercholesterolemia, unspecified: Secondary | ICD-10-CM

## 2019-11-24 DIAGNOSIS — R7989 Other specified abnormal findings of blood chemistry: Secondary | ICD-10-CM

## 2019-11-24 DIAGNOSIS — K219 Gastro-esophageal reflux disease without esophagitis: Secondary | ICD-10-CM

## 2019-11-24 DIAGNOSIS — Z0000 Encounter for general adult medical examination without abnormal findings: Secondary | ICD-10-CM

## 2019-11-24 DIAGNOSIS — R945 Abnormal results of liver function studies: Secondary | ICD-10-CM | POA: Diagnosis not present

## 2019-11-24 DIAGNOSIS — D860 Sarcoidosis of lung: Secondary | ICD-10-CM

## 2019-11-24 DIAGNOSIS — J189 Pneumonia, unspecified organism: Secondary | ICD-10-CM

## 2019-11-24 DIAGNOSIS — R739 Hyperglycemia, unspecified: Secondary | ICD-10-CM

## 2019-11-24 NOTE — Assessment & Plan Note (Addendum)
Physical today 11/24/19.  Colonoscopy 12/2014 - recommended f/u in 5 years.  Mammogram 10/13/18 - Birads I. Due f/u mammogram.
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
## 2019-11-27 ENCOUNTER — Encounter: Payer: Self-pay | Admitting: Internal Medicine

## 2019-11-27 NOTE — Assessment & Plan Note (Signed)
Diet, exercise and weight loss. Follow liver function tests.   

## 2019-11-28 ENCOUNTER — Encounter: Payer: Managed Care, Other (non HMO) | Admitting: Physical Therapy

## 2019-11-28 NOTE — Assessment & Plan Note (Signed)
Followed by Dr Raul Del.  CT as outlined.  Planning for f/u PET scan.

## 2019-11-28 NOTE — Assessment & Plan Note (Signed)
Low carb diet and exercise.  Follow met b and a1c.  

## 2019-11-28 NOTE — Assessment & Plan Note (Signed)
On crestor.  Low cholesterol diet and exercise.  Follow lipid panel and liver function tests.   

## 2019-11-28 NOTE — Assessment & Plan Note (Signed)
Controlled on omeprazole.  Follow.

## 2019-11-28 NOTE — Assessment & Plan Note (Signed)
CT - changes as outlined.  Referred to pulmonary.  Planning for PET scan.  Awaiting insurance approval.

## 2019-11-29 ENCOUNTER — Other Ambulatory Visit: Payer: Self-pay

## 2019-11-29 ENCOUNTER — Ambulatory Visit
Admission: RE | Admit: 2019-11-29 | Discharge: 2019-11-29 | Disposition: A | Discharge status: Home / Self Care | Payer: Managed Care, Other (non HMO) | Source: Ambulatory Visit | Attending: Internal Medicine | Admitting: Internal Medicine

## 2019-11-29 DIAGNOSIS — Z1231 Encounter for screening mammogram for malignant neoplasm of breast: Secondary | ICD-10-CM | POA: Diagnosis not present

## 2019-12-05 ENCOUNTER — Encounter: Payer: Managed Care, Other (non HMO) | Admitting: Physical Therapy

## 2019-12-06 ENCOUNTER — Ambulatory Visit: Payer: Managed Care, Other (non HMO) | Admitting: Pharmacist

## 2019-12-06 DIAGNOSIS — R739 Hyperglycemia, unspecified: Secondary | ICD-10-CM

## 2019-12-06 DIAGNOSIS — E7800 Pure hypercholesterolemia, unspecified: Secondary | ICD-10-CM

## 2019-12-06 DIAGNOSIS — Z6828 Body mass index (BMI) 28.0-28.9, adult: Secondary | ICD-10-CM

## 2019-12-06 NOTE — Patient Instructions (Signed)
Visit Information Patient Care Plan: Medication Management    Problem Identified: Diabetes, Obesity     Long-Range Goal: Disease Progression Prevention   This Visit's Progress: On track  Priority: High  Note:   Current Barriers:  . Unable to achieve control of weight   Pharmacist Clinical Goal(s):  Marland Kitchen Over the next 90 days, patient will maintain control of weight through collaboration with PharmD and provider.   Interventions: . Inter-disciplinary care team collaboration (see longitudinal plan of care) . Comprehensive medication review performed; medication list updated in electronic medical record  Weight Management/Hyperglycemia: . Controlled; current treatment: Ozempic 1 mg weekly- insurance does not cover weight loss medication, using GLP1 for hyperglycemia, weight management, prevention of progression to T2DM . Baseline weight prior to therapy: 184 lbs . Most recent home weight: 163 lbs  . Current weight loss total on therapy: 21 lbs (~11.4% weight loss from baseline) . Reviewed importance of continuing therapy along with focus on diet and exercise to maintain weight loss. Patient notified by insurance that new PA required in January for [place]. Will collaborate w/ provider for that.   Hyperlipidemia: . Controlled; current treatment: rosuvastatin 10 mg daily  . Discussed importance of adherence at last visit. Patient up to date on fills . Recommended to continue current therapy  Depression: . Currently well managed; current treatment: sertraline 50 mg daily . Recommended to continue current therapy  Migraines: . Currently well managed; current treatment: zolmitriptan 5 mg PRN, no recent use . Recommend to continue current regimen   Dermatologic Conditions . Appropriately managed by dermatology; current treatment: minoxidil 1.25 mg daily, finasteride 2.5 mg daily  . Recommend to continue current regimen  Patient Goals/Self-Care Activities . Over the next 90 days,  patient will:  - take medications as prescribed target a minimum of 150 minutes of moderate intensity exercise weekly engage in dietary modifications by continuing to monitor portion sizes  Follow Up Plan: Telephone follow up appointment with care management team member scheduled for:~ 4 weeks     The patient verbalized understanding of instructions, educational materials, and care plan provided today and declined offer to receive copy of patient instructions, educational materials, and care plan.   Plan: Telephone follow up appointment with care management team member scheduled for:~ 4 weeks  Catie Darnelle Maffucci, PharmD, [place], [place] Pharmacist [place] [phone]
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
## 2019-12-07 ENCOUNTER — Ambulatory Visit
Admission: RE | Admit: 2019-12-07 | Discharge: 2019-12-07 | Disposition: A | Discharge status: Home / Self Care | Payer: Managed Care, Other (non HMO) | Source: Ambulatory Visit | Attending: Pulmonary Disease | Admitting: Pulmonary Disease

## 2019-12-07 ENCOUNTER — Other Ambulatory Visit: Payer: Self-pay

## 2019-12-07 DIAGNOSIS — D869 Sarcoidosis, unspecified: Secondary | ICD-10-CM | POA: Diagnosis not present

## 2019-12-07 DIAGNOSIS — R918 Other nonspecific abnormal finding of lung field: Secondary | ICD-10-CM | POA: Diagnosis present

## 2019-12-07 LAB — GLUCOSE, CAPILLARY: Glucose-Capillary: 79 mg/dL (ref 70–99)

## 2019-12-07 MED ORDER — FLUDEOXYGLUCOSE F - 18 (FDG) INJECTION
8.4000 | Freq: Once | INTRAVENOUS | Status: AC | PRN
  Administered 2019-12-07: 8.73 via INTRAVENOUS

## 2019-12-23 ENCOUNTER — Other Ambulatory Visit: Payer: Self-pay

## 2019-12-23 ENCOUNTER — Other Ambulatory Visit: Payer: Managed Care, Other (non HMO)

## 2019-12-23 DIAGNOSIS — E785 Hyperlipidemia, unspecified: Secondary | ICD-10-CM | POA: Diagnosis not present

## 2019-12-23 DIAGNOSIS — D869 Sarcoidosis, unspecified: Secondary | ICD-10-CM

## 2019-12-23 DIAGNOSIS — R739 Hyperglycemia, unspecified: Secondary | ICD-10-CM | POA: Diagnosis not present

## 2019-12-24 LAB — ANGIOTENSIN CONVERTING ENZYME: Angio Convert Enzyme: 29 U/L (ref 14–82)

## 2019-12-24 LAB — BASIC METABOLIC PANEL
BUN/Creatinine Ratio: 22 (ref 12–28)
BUN: 19 mg/dL (ref 8–27)
CO2: 26 mmol/L (ref 20–29)
Calcium: 9.6 mg/dL (ref 8.7–10.3)
Chloride: 101 mmol/L (ref 96–106)
Creatinine, Ser: 0.86 mg/dL (ref 0.57–1.00)
GFR calc Af Amer: 80 mL/min/{1.73_m2} (ref >59)
GFR calc non Af Amer: 69 mL/min/{1.73_m2} (ref >59)
Glucose: 100 mg/dL — ABNORMAL HIGH (ref 65–99)
Potassium: 4.3 mmol/L (ref 3.5–5.2)
Sodium: 141 mmol/L (ref 134–144)

## 2019-12-24 LAB — HEPATIC FUNCTION PANEL
ALT: 12 IU/L (ref 0–32)
AST: 17 IU/L (ref 0–40)
Albumin: 4.8 g/dL (ref 3.8–4.8)
Alkaline Phosphatase: 57 IU/L (ref 44–121)
Bilirubin Total: 0.3 mg/dL (ref 0.0–1.2)
Bilirubin, Direct: <0.1 mg/dL (ref 0.00–0.40)
Total Protein: 7.2 g/dL (ref 6.0–8.5)

## 2019-12-24 LAB — HGB A1C W/O EAG: Hgb A1c MFr Bld: 5.4 % (ref 4.8–5.6)

## 2019-12-24 LAB — LIPID PANEL WITH LDL/HDL RATIO
Cholesterol, Total: 155 mg/dL (ref 100–199)
HDL: 46 mg/dL (ref >39)
LDL Chol Calc (NIH): 80 mg/dL (ref 0–99)
LDL/HDL Ratio: 1.7 ratio (ref 0.0–3.2)
Triglycerides: 173 mg/dL — ABNORMAL HIGH (ref 0–149)
VLDL Cholesterol Cal: 29 mg/dL (ref 5–40)

## 2019-12-24 LAB — C-REACTIVE PROTEIN: CRP: 3 mg/L (ref 0–10)

## 2019-12-26 ENCOUNTER — Encounter: Payer: Self-pay | Admitting: Internal Medicine

## 2020-01-05 ENCOUNTER — Other Ambulatory Visit: Payer: Self-pay

## 2020-01-05 ENCOUNTER — Encounter
Admission: RE | Admit: 2020-01-05 | Discharge: 2020-01-05 | Disposition: A | Discharge status: Home / Self Care | Payer: Managed Care, Other (non HMO) | Source: Ambulatory Visit | Attending: Pulmonary Disease | Admitting: Pulmonary Disease

## 2020-01-05 HISTORY — DX: Other specified postprocedural states: Z98.890

## 2020-01-05 HISTORY — DX: Other specified postprocedural states: R11.2

## 2020-01-05 HISTORY — DX: Cardiac murmur, unspecified: R01.1

## 2020-01-05 HISTORY — DX: Other complications of anesthesia, initial encounter: T88.59XA

## 2020-01-05 NOTE — Patient Instructions (Signed)
Your procedure is scheduled on:01-13-20 FRIDAY Report to the Registration Desk on the 1st floor of the Medical Mall-Then proceed to the 2nd floor Surgery Desk in the Medical Mall To find out your arrival time, please call [phone] between 1PM - 3PM on:01-12-20 THURSDAY  REMEMBER: Instructions that are not followed completely may result in serious medical risk, up to and including death; or upon the discretion of your surgeon and anesthesiologist your surgery may need to be rescheduled.  Do not eat food after midnight the night before surgery.  No gum chewing, lozengers or hard candies.  You may however, drink CLEAR liquids up to 2 hours before you are scheduled to arrive for your surgery. Do not drink anything within 2 hours of your scheduled arrival time.  Clear liquids include: - water  - apple juice without pulp - gatorade  - black coffee or tea (Do NOT add milk or creamers to the coffee or tea) Do NOT drink anything that is not on this list.  TAKE THESE MEDICATIONS THE MORNING OF SURGERY WITH A SIP OF WATER:  -SERTRALINE (ZOLOFT) -PRILOSEC (OMEPRAZOLE)-take one the night before and one on the morning of surgery - helps to prevent nausea after surgery.)  One week prior to surgery: Stop Anti-inflammatories (NSAIDS) such as Advil, Aleve, Ibuprofen, Motrin, Naproxen, Naprosyn and Aspirin based products such as Excedrin, Goodys Powder, BC Powder-OK TO TAKE TYLENOL IF NEEDED  Stop ANY OVER THE COUNTER supplements until after surgery-STOP YOUR FISH OIL NOW-YOU MAY RESUME AFTER SURGERY  No Alcohol for 24 hours before or after surgery.  No Smoking including e-cigarettes for 24 hours prior to surgery.  No chewable tobacco products for at least 6 hours prior to surgery.  No nicotine patches on the day of surgery.  Do not use any "recreational" drugs for at least a week prior to your surgery.  Please be advised that the combination of cocaine and anesthesia may have negative outcomes, up  to and including death. If you test positive for cocaine, your surgery will be cancelled.  On the morning of surgery brush your teeth with toothpaste and water, you may rinse your mouth with mouthwash if you wish. Do not swallow any toothpaste or mouthwash.  Do not wear jewelry, make-up, hairpins, clips or nail polish.  Do not wear lotions, powders, or perfumes.   Do not shave body from the neck down 48 hours prior to surgery just in case you cut yourself which could leave a site for infection.  Also, freshly shaved skin may become irritated if using the CHG soap.  Contact lenses, hearing aids and dentures may not be worn into surgery.  Do not bring valuables to the hospital. [facility] is not responsible for any missing/lost belongings or valuables.   Notify your doctor if there is any change in your medical condition (cold, fever, infection).  Wear comfortable clothing (specific to your surgery type) to the hospital.  Plan for stool softeners for home use; pain medications have a tendency to cause constipation. You can also help prevent constipation by eating foods high in fiber such as fruits and vegetables and drinking plenty of fluids as your diet allows.  After surgery, you can help prevent lung complications by doing breathing exercises.  Take deep breaths and cough every 1-2 hours. Your doctor may order a device called an Incentive Spirometer to help you take deep breaths. When coughing or sneezing, hold a pillow firmly against your incision with both hands. This is called "splinting."  Doing this helps protect your incision. It also decreases belly discomfort.  If you are being admitted to the hospital overnight, leave your suitcase in the car. After surgery it may be brought to your room.  If you are being discharged the day of surgery, you will not be allowed to drive home. You will need a responsible adult (18 years or older) to drive you home and stay with you that night.    If you are taking public transportation, you will need to have a responsible adult (18 years or older) with you. Please confirm with your physician that it is acceptable to use public transportation.   Please call the Pre-admissions Testing Dept. at [phone] if you have any questions about these instructions.  Visitation Policy:  Patients undergoing a surgery or procedure may have one family member or support person with them as long as that person is not COVID-19 positive or experiencing its symptoms.  That person may remain in the waiting area during the procedure.  Inpatient Visitation Update:   In an effort to ensure the safety of our team members and our patients, we are implementing a change to our visitation policy:  Effective Monday, Aug. 9, at 7 a.m., inpatients will be allowed one support person.  o The support person may change daily.  o The support person must pass our screening, gel in and out, and wear a mask at all times, including in the patient's room.  o Patients must also wear a mask when staff or their support person are in the room.  o Masking is required regardless of vaccination status.  Systemwide, no visitors 17 or younger.

## 2020-01-06 ENCOUNTER — Other Ambulatory Visit: Payer: Self-pay | Admitting: Internal Medicine

## 2020-01-09 ENCOUNTER — Ambulatory Visit: Payer: Managed Care, Other (non HMO) | Admitting: Pharmacist

## 2020-01-09 ENCOUNTER — Encounter
Admission: RE | Admit: 2020-01-09 | Discharge: 2020-01-09 | Disposition: A | Discharge status: Home / Self Care | Payer: Managed Care, Other (non HMO) | Source: Ambulatory Visit | Attending: Pulmonary Disease | Admitting: Pulmonary Disease

## 2020-01-09 ENCOUNTER — Other Ambulatory Visit: Payer: Self-pay

## 2020-01-09 DIAGNOSIS — R011 Cardiac murmur, unspecified: Secondary | ICD-10-CM | POA: Insufficient documentation

## 2020-01-09 DIAGNOSIS — R739 Hyperglycemia, unspecified: Secondary | ICD-10-CM

## 2020-01-09 DIAGNOSIS — Z01818 Encounter for other preprocedural examination: Secondary | ICD-10-CM | POA: Diagnosis present

## 2020-01-09 DIAGNOSIS — E7800 Pure hypercholesterolemia, unspecified: Secondary | ICD-10-CM

## 2020-01-09 DIAGNOSIS — Z6828 Body mass index (BMI) 28.0-28.9, adult: Secondary | ICD-10-CM

## 2020-01-09 LAB — PROTIME-INR
INR: 0.9 (ref 0.8–1.2)
Prothrombin Time: 12.2 seconds (ref 11.4–15.2)

## 2020-01-09 LAB — CBC
HCT: 39.4 % (ref 36.0–46.0)
Hemoglobin: 13 g/dL (ref 12.0–15.0)
MCH: 29.7 pg (ref 26.0–34.0)
MCHC: 33 g/dL (ref 30.0–36.0)
MCV: 90 fL (ref 80.0–100.0)
Platelets: 216 10*3/uL (ref 150–400)
RBC: 4.38 MIL/uL (ref 3.87–5.11)
RDW: 11.9 % (ref 11.5–15.5)
WBC: 7.7 10*3/uL (ref 4.0–10.5)
nRBC: 0 % (ref 0.0–0.2)

## 2020-01-09 LAB — APTT: aPTT: 31 seconds (ref 24–36)

## 2020-01-09 NOTE — Patient Instructions (Signed)
Visit Information  Patient Care Plan: Medication Management    Problem Identified: Diabetes, Obesity     Long-Range Goal: Disease Progression Prevention   This Visit's Progress: On track  Recent Progress: On track  Priority: High  Note:   Current Barriers:  . Unable to achieve control of weight  . Diagnosis of hyperglycemia/prediabetes  Pharmacist Clinical Goal(s):  Marland Kitchen Over the next 90 days, patient will maintain control of weight through collaboration with PharmD and provider.   Interventions: . Inter-disciplinary care team collaboration (see longitudinal plan of care) . Comprehensive medication review performed; medication list updated in electronic medical record  Weight Management/Hyperglycemia: . Controlled; current treatment: Ozempic 1 mg weekly- insurance does not cover weight loss medication, using GLP1 for hyperglycemia, weight management, prevention of progression to T2DM . Baseline weight prior to therapy: 184 lbs . Most recent home weight: 163 lbs  . Current weight loss total on therapy: 21 lbs (~11.4% weight loss from baseline) . Submitted PA for Ozempic. PA approved 01/09/20-01/08/21. Contacted patient, LVM with this information  Hyperlipidemia: . Controlled; current treatment: rosuvastatin 10 mg daily  . Recommended to continue current therapy  Depression: . Currently well managed; current treatment: sertraline 50 mg daily . Recommended to continue current therapy  Migraines: . Currently well managed; current treatment: zolmitriptan 5 mg PRN, no recent use . Recommend to continue current regimen   Dermatologic Conditions . Appropriately managed by dermatology; current treatment: minoxidil 1.25 mg daily, finasteride 2.5 mg daily  . Recommend to continue current regimen  Patient Goals/Self-Care Activities . Over the next 90 days, patient will:  - take medications as prescribed target a minimum of 150 minutes of moderate intensity exercise weekly engage in  dietary modifications by continuing to monitor portion sizes  Follow Up Plan: Telephone follow up appointment with care management team member scheduled for:~ 4 weeks     The patient verbalized understanding of instructions, educational materials, and care plan provided today and declined offer to receive copy of patient instructions, educational materials, and care plan.   Plan: Telephone follow up appointment with care management team member scheduled for:~ 4 weeks  Catie Feliz Beam, PharmD, [place], CPP Clinical Pharmacist [employer] at [employer] [phone]

## 2020-01-09 NOTE — Chronic Care Management (AMB) (Signed)
Care Management   Pharmacy Note  01/09/2020 Name: Christy Escobar MRN: [id number] DOB: 02-Jul-1950  Christy Escobar is a 70 y.o. year old female who is a primary care patient of Einar Pheasant, MD. The Care Management/Care Coordination team team was consulted for assistance with care management and care coordination needs.     Collaboration with insurance/CoverMyMeds for follow up visit in response to provider referral for pharmacy case management and/or care coordination services.   Consent to Services:  Patient was given information about care management/care coordination services, agreed to services, and gave verbal consent prior to initiation of services. Please see initial visit note for detailed documentation.   Review of patient status, including review of consultants reports, laboratory and other test data, was performed as part of comprehensive evaluation and provision of chronic care management services.   SDOH (Social Determinants of Health) assessments and interventions performed:  no  Objective:  Lab Results  Component Value Date   CREATININE 0.86 12/23/2019   CREATININE 0.70 07/12/2019   CREATININE 0.84 05/31/2019    Lab Results  Component Value Date   HGBA1C 5.4 12/23/2019       Component Value Date/Time   CHOL 155 12/23/2019 0932   TRIG 173 (H) 12/23/2019 0932   HDL 46 12/23/2019 0932   CHOLHDL 4.7 (H) 05/31/2019 0925   LDLCALC 80 12/23/2019 0932    Clinical ASCVD: No  The 10-year ASCVD risk score Mikey Bussing DC Jr., et al., 2013) is: 7.9%   Values used to calculate the score:     Age: 74 years     Sex: Female     Is Non-Hispanic African American: No     Diabetic: No     Tobacco smoker: No     Systolic Blood Pressure: 123XX123 mmHg     Is BP treated: No     HDL Cholesterol: 46 mg/dL     Total Cholesterol: 155 mg/dL     BP Readings from Last 3 Encounters:  11/24/19 126/70  08/23/19 120/70  05/20/19 126/76    Care Plan  Allergies  Allergen Reactions  .  Clarithromycin Nausea Only and Other (See Comments)    Nausea, GI burning     Medications Reviewed Today    Reviewed by Melanee Left, RN (Registered Nurse) on 01/05/20 at 1351  Med List Status: Complete  Medication Order Taking? Sig Documenting Provider Last Dose Status Informant  estradiol (ESTRACE) 1 MG tablet [id number] Yes Take 1 tablet (1 mg total) by mouth daily. Einar Pheasant, MD  Active Self  finasteride (PROSCAR) 5 MG tablet [id number] Yes Take 2.5 mg by mouth every evening. [provider]  Active Self  fish oil-omega-3 fatty acids 1000 MG capsule [id number] Yes Take 1 g by mouth 3 (three) times daily. [provider]  Active Self  fluticasone (FLONASE) 50 MCG/ACT nasal spray [id number] Yes USE TWO SPRAY(S) IN EACH NOSTRIL ONCE DAILY  Patient taking differently: Place 1 spray into both nostrils daily as needed for allergies.   Einar Pheasant, MD  Active Self  minoxidil (LONITEN) 2.5 MG tablet [id number] Yes Take 1.25 mg by mouth every morning. [provider]  Active Self  omeprazole (PRILOSEC) 20 MG capsule [id number] Yes Take 1 capsule (20 mg total) by mouth 2 (two) times daily before a meal.  Patient taking differently: Take 20 mg by mouth every morning.   Earleen Newport, MD  Active Self  rosuvastatin (CRESTOR) 10 MG tablet [id number] Yes Take 1 tablet (10 mg  total) by mouth daily.  Patient taking differently: Take 10 mg by mouth every evening.   Dale [place], MD  Active Self  Semaglutide, 1 MG/DOSE, (OZEMPIC, 1 MG/DOSE,) 2 MG/1.5ML SOPN [id number] Yes Inject 0.75 mLs (1 mg total) into the skin once a week.  Patient taking differently: Inject 1 mg into the skin every Friday.   Dale [place], MD  Active Self  sertraline (ZOLOFT) 50 MG tablet [id number] Yes Take 1 tablet by mouth once daily  Patient taking differently: Take 50 mg by mouth every morning.   Dale Ness, MD  Active Self  valACYclovir (VALTREX) 1000 MG tablet [id number] Yes Take  1,000 mg by mouth daily as needed (outbreaks). [provider]  Active Self  zolmitriptan (ZOMIG) 5 MG tablet [id number] Yes TAKE 1/2 (ONE-HALF) TABLET BY MOUTH ONCE DAILY AS NEEDED  Patient taking differently: Take 2.5 mg by mouth daily as needed for migraine.   Dale [place], MD  Active Self          Patient Active Problem List   Diagnosis Date Noted  . Sinus congestion 11/20/2019  . Low back pain 08/29/2019  . Weight loss 05/21/2019  . CAP (community acquired pneumonia) 04/18/2019  . 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 Abdominal pain 04/14/2019  . Left ovarian cyst 09/24/2018  . DOE (dyspnea on exertion) 08/28/2018  . 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 Depression 02/01/2018  . History of traumatic injury to musculoskeletal system 02/01/2018  . Migraine headache 02/01/2018  . Onychomycosis due to dermatophyte 02/01/2018  . 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 Synovitis and tenosynovitis, unspecified 02/01/2018  . Hair loss 08/09/2017  . Tick bite 04/18/2017  . Stress incontinence in female 05/27/2016  . BMI 32.0-32.9,adult 08/01/2015  . Hyperglycemia 08/01/2015  . History of colonic polyps 12/20/2014  . Plantar fasciitis 12/04/2014  . Chest pain 09/03/2014  . Health care maintenance 07/06/2014  . Stress 08/01/2013  . Vaginal dryness 05/08/2013  . Elevated blood pressure 12/02/2012  . Menopausal syndrome 03/21/2012  . GERD (gastroesophageal reflux disease) 03/21/2012  . Abnormal liver function test 03/21/2012  . Sarcoidosis 12/16/2011  . Hypercholesterolemia 12/16/2011  . Urinary incontinence 12/16/2011  . Osteopenia 12/16/2011    Conditions to be addressed/monitored per PCP order: prediabetes, weight management, HLD  Patient Care Plan: Medication Management    Problem Identified: Diabetes, Obesity     Long-Range Goal: Disease Progression Prevention   This Visit's Progress: On track  Recent Progress: On track   Priority: High  Note:   Current Barriers:  . Unable to achieve control of weight  . Diagnosis of hyperglycemia/prediabetes  Pharmacist Clinical Goal(s):  Marland Kitchen Over the next 90 days, patient will maintain control of weight through collaboration with PharmD and provider.   Interventions: . Inter-disciplinary care team collaboration (see longitudinal plan of care) . Comprehensive medication review performed; medication list updated in electronic medical record  Weight Management/Hyperglycemia: . Controlled; current treatment: Ozempic 1 mg weekly- insurance does not cover weight loss medication, using GLP1 for hyperglycemia, weight management, prevention of progression to T2DM . Baseline weight prior to therapy: 184 lbs . Most recent home weight: 163 lbs  . Current weight loss total on therapy: 21 lbs (~11.4% weight loss from baseline) . Submitted PA for Ozempic. PA approved 01/09/20-01/08/21. Contacted patient, LVM with this information  Hyperlipidemia: . Controlled; current treatment: rosuvastatin 10 mg daily  . Recommended to continue current therapy  Depression: . Currently well managed; current treatment: sertraline 50 mg daily .  Recommended to continue current therapy  Migraines: . Currently well managed; current treatment: zolmitriptan 5 mg PRN, no recent use . Recommend to continue current regimen   Dermatologic Conditions . Appropriately managed by dermatology; current treatment: minoxidil 1.25 mg daily, finasteride 2.5 mg daily  . Recommend to continue current regimen  Patient Goals/Self-Care Activities . Over the next 90 days, patient will:  - take medications as prescribed target a minimum of 150 minutes of moderate intensity exercise weekly engage in dietary modifications by continuing to monitor portion sizes  Follow Up Plan: Telephone follow up appointment with care management team member scheduled for:~ 4 weeks     Medication Assistance: None required. Patient  affirms current coverage meets needs.   Plan: Telephone follow up appointment with care management team member scheduled for:~ 4 weeks  Catie Darnelle Maffucci, PharmD, [place], [place] Clinical Pharmacist [employer] at [employer] [phone]

## 2020-01-10 ENCOUNTER — Other Ambulatory Visit: Payer: Self-pay | Admitting: Pulmonary Disease

## 2020-01-10 DIAGNOSIS — R918 Other nonspecific abnormal finding of lung field: Secondary | ICD-10-CM

## 2020-01-11 ENCOUNTER — Other Ambulatory Visit
Admission: RE | Admit: 2020-01-11 | Discharge: 2020-01-11 | Disposition: A | Discharge status: Home / Self Care | Payer: Managed Care, Other (non HMO) | Source: Ambulatory Visit | Attending: Pulmonary Disease | Admitting: Pulmonary Disease

## 2020-01-11 ENCOUNTER — Other Ambulatory Visit: Payer: Self-pay

## 2020-01-11 DIAGNOSIS — Z20822 Contact with and (suspected) exposure to covid-19: Secondary | ICD-10-CM | POA: Diagnosis not present

## 2020-01-11 DIAGNOSIS — Z01818 Encounter for other preprocedural examination: Secondary | ICD-10-CM | POA: Insufficient documentation

## 2020-01-11 LAB — SARS CORONAVIRUS 2 (TAT 6-24 HRS): SARS Coronavirus 2: NEGATIVE

## 2020-01-12 ENCOUNTER — Ambulatory Visit
Admission: RE | Admit: 2020-01-12 | Discharge: 2020-01-12 | Disposition: A | Discharge status: Home / Self Care | Payer: Managed Care, Other (non HMO) | Source: Ambulatory Visit | Attending: Pulmonary Disease | Admitting: Pulmonary Disease

## 2020-01-12 DIAGNOSIS — R918 Other nonspecific abnormal finding of lung field: Secondary | ICD-10-CM | POA: Insufficient documentation

## 2020-01-13 ENCOUNTER — Ambulatory Visit: Payer: Managed Care, Other (non HMO)

## 2020-01-13 ENCOUNTER — Encounter: Payer: Self-pay | Admitting: *Deleted

## 2020-01-13 ENCOUNTER — Ambulatory Visit: Payer: Managed Care, Other (non HMO) | Admitting: Certified Registered Nurse Anesthetist

## 2020-01-13 ENCOUNTER — Encounter
Admission: RE | Disposition: A | Discharge status: Home / Self Care | Payer: Self-pay | Source: Home / Self Care | Attending: Pulmonary Disease

## 2020-01-13 ENCOUNTER — Ambulatory Visit
Admission: RE | Admit: 2020-01-13 | Discharge: 2020-01-13 | Disposition: A | Discharge status: Home / Self Care | Payer: Managed Care, Other (non HMO) | Attending: Pulmonary Disease | Admitting: Pulmonary Disease

## 2020-01-13 ENCOUNTER — Other Ambulatory Visit: Payer: Self-pay

## 2020-01-13 DIAGNOSIS — R011 Cardiac murmur, unspecified: Secondary | ICD-10-CM | POA: Insufficient documentation

## 2020-01-13 DIAGNOSIS — R918 Other nonspecific abnormal finding of lung field: Secondary | ICD-10-CM | POA: Insufficient documentation

## 2020-01-13 DIAGNOSIS — Z8541 Personal history of malignant neoplasm of cervix uteri: Secondary | ICD-10-CM | POA: Insufficient documentation

## 2020-01-13 DIAGNOSIS — Z9889 Other specified postprocedural states: Secondary | ICD-10-CM

## 2020-01-13 HISTORY — PX: VIDEO BRONCHOSCOPY WITH ENDOBRONCHIAL NAVIGATION: SHX6175

## 2020-01-13 SURGERY — VIDEO BRONCHOSCOPY WITH ENDOBRONCHIAL NAVIGATION
Anesthesia: General

## 2020-01-13 MED ORDER — ORAL CARE MOUTH RINSE: 15.0000 mL | Freq: Once | OROMUCOSAL | Status: AC

## 2020-01-13 MED ORDER — PROPOFOL 10 MG/ML IV BOLUS
INTRAVENOUS | Status: AC
  Filled 2020-01-13: qty 20

## 2020-01-13 MED ORDER — PHENYLEPHRINE HCL 0.25 % NA SOLN
1.0000 | Freq: Four times a day (QID) | NASAL | Status: DC | PRN
  Filled 2020-01-13: qty 15

## 2020-01-13 MED ORDER — MIDAZOLAM HCL 2 MG/2ML IJ SOLN
INTRAMUSCULAR | Status: DC | PRN
  Administered 2020-01-13: 2 mg via INTRAVENOUS

## 2020-01-13 MED ORDER — MIDAZOLAM HCL 2 MG/2ML IJ SOLN
INTRAMUSCULAR | Status: AC
  Filled 2020-01-13: qty 2

## 2020-01-13 MED ORDER — GLYCOPYRROLATE 0.2 MG/ML IJ SOLN
INTRAMUSCULAR | Status: AC
  Filled 2020-01-13: qty 1

## 2020-01-13 MED ORDER — FENTANYL CITRATE (PF) 100 MCG/2ML IJ SOLN
INTRAMUSCULAR | Status: AC
  Filled 2020-01-13: qty 2

## 2020-01-13 MED ORDER — ONDANSETRON HCL 4 MG/2ML IJ SOLN
INTRAMUSCULAR | Status: AC
  Filled 2020-01-13: qty 2

## 2020-01-13 MED ORDER — LIDOCAINE HCL URETHRAL/MUCOSAL 2 % EX GEL
1.0000 "application " | Freq: Once | CUTANEOUS | Status: DC
  Filled 2020-01-13: qty 5

## 2020-01-13 MED ORDER — DEXAMETHASONE SODIUM PHOSPHATE 10 MG/ML IJ SOLN
INTRAMUSCULAR | Status: AC
  Filled 2020-01-13: qty 1

## 2020-01-13 MED ORDER — DEXAMETHASONE SODIUM PHOSPHATE 10 MG/ML IJ SOLN
INTRAMUSCULAR | Status: DC | PRN
  Administered 2020-01-13: 10 mg via INTRAVENOUS

## 2020-01-13 MED ORDER — ONDANSETRON HCL 4 MG/2ML IJ SOLN
INTRAMUSCULAR | Status: DC | PRN
  Administered 2020-01-13: 4 mg via INTRAVENOUS

## 2020-01-13 MED ORDER — FENTANYL CITRATE (PF) 100 MCG/2ML IJ SOLN
INTRAMUSCULAR | Status: DC | PRN
  Administered 2020-01-13: 50 ug via INTRAVENOUS
  Administered 2020-01-13 (×2): 25 ug via INTRAVENOUS

## 2020-01-13 MED ORDER — ROCURONIUM BROMIDE 100 MG/10ML IV SOLN
INTRAVENOUS | Status: DC | PRN
  Administered 2020-01-13: 50 mg via INTRAVENOUS

## 2020-01-13 MED ORDER — LACTATED RINGERS IV SOLN: INTRAVENOUS | Status: DC

## 2020-01-13 MED ORDER — BUTAMBEN-TETRACAINE-BENZOCAINE 2-2-14 % EX AERO
1.0000 | INHALATION_SPRAY | Freq: Once | CUTANEOUS | Status: DC
  Filled 2020-01-13: qty 20

## 2020-01-13 MED ORDER — CHLORHEXIDINE GLUCONATE 0.12 % MT SOLN
OROMUCOSAL | Status: AC
  Administered 2020-01-13: 15 mL via OROMUCOSAL
  Filled 2020-01-13: qty 15

## 2020-01-13 MED ORDER — SCOPOLAMINE 1 MG/3DAYS TD PT72
MEDICATED_PATCH | TRANSDERMAL | Status: AC
  Administered 2020-01-13: 1.5 mg via TRANSDERMAL
  Filled 2020-01-13: qty 1

## 2020-01-13 MED ORDER — SUGAMMADEX SODIUM 200 MG/2ML IV SOLN
INTRAVENOUS | Status: DC | PRN
  Administered 2020-01-13: 100 mg via INTRAVENOUS
  Administered 2020-01-13: 200 mg via INTRAVENOUS

## 2020-01-13 MED ORDER — SCOPOLAMINE 1 MG/3DAYS TD PT72: 1.0000 | MEDICATED_PATCH | Freq: Once | TRANSDERMAL | Status: DC

## 2020-01-13 MED ORDER — PROPOFOL 10 MG/ML IV BOLUS
INTRAVENOUS | Status: DC | PRN
  Administered 2020-01-13: 150 mg via INTRAVENOUS

## 2020-01-13 MED ORDER — LIDOCAINE HCL (PF) 1 % IJ SOLN
30.0000 mL | Freq: Once | INTRAMUSCULAR | Status: DC
  Filled 2020-01-13: qty 30

## 2020-01-13 MED ORDER — CHLORHEXIDINE GLUCONATE 0.12 % MT SOLN: 15.0000 mL | Freq: Once | OROMUCOSAL | Status: AC

## 2020-01-13 MED ORDER — ROCURONIUM BROMIDE 10 MG/ML (PF) SYRINGE
PREFILLED_SYRINGE | INTRAVENOUS | Status: AC
  Filled 2020-01-13: qty 10

## 2020-01-13 MED ORDER — PHENYLEPHRINE HCL (PRESSORS) 10 MG/ML IV SOLN
INTRAVENOUS | Status: DC | PRN
  Administered 2020-01-13 (×3): 100 ug via INTRAVENOUS

## 2020-01-13 MED ORDER — LIDOCAINE HCL (PF) 2 % IJ SOLN
INTRAMUSCULAR | Status: AC
  Filled 2020-01-13: qty 5

## 2020-01-13 MED ORDER — LIDOCAINE HCL (CARDIAC) PF 100 MG/5ML IV SOSY
PREFILLED_SYRINGE | INTRAVENOUS | Status: DC | PRN
  Administered 2020-01-13: 80 mg via INTRAVENOUS

## 2020-01-13 MED ORDER — EPHEDRINE 5 MG/ML INJ
INTRAVENOUS | Status: AC
  Filled 2020-01-13: qty 10

## 2020-01-13 MED ORDER — PHENYLEPHRINE HCL (PRESSORS) 10 MG/ML IV SOLN
INTRAVENOUS | Status: AC
  Filled 2020-01-13: qty 1

## 2020-01-13 MED ORDER — EPHEDRINE SULFATE 50 MG/ML IJ SOLN
INTRAMUSCULAR | Status: DC | PRN
  Administered 2020-01-13 (×2): 5 mg via INTRAVENOUS

## 2020-01-13 NOTE — Discharge Instructions (Addendum)
Flexible Bronchoscopy, Care After This sheet gives you information about how to care for yourself after your test. Your doctor may also give you more specific instructions. If you have problems or questions, contact your doctor. Follow these instructions at home: Eating and drinking  Do not eat or drink anything (not even water) for 2 hours after your test, or until your numbing medicine (local anesthetic) wears off.  When your numbness is gone and your cough and gag reflexes have come back, you may: ? Eat only soft foods. ? Slowly drink liquids.  The day after the test, go back to your normal diet. Driving  Do not drive for 24 hours if you were given a medicine to help you relax (sedative).  Do not drive or use heavy machinery while taking prescription pain medicine. General instructions   Take over-the-counter and prescription medicines only as told by your doctor.  Return to your normal activities as told. Ask what activities are safe for you.  Do not use any products that have nicotine or tobacco in them. This includes cigarettes and e-cigarettes. If you need help quitting, ask your doctor.  Keep all follow-up visits as told by your doctor. This is important. It is very important if you had a tissue sample (biopsy) taken. Get help right away if:  You have shortness of breath that gets worse.  You get light-headed.  You feel like you are going to pass out (faint).  You have chest pain.  You cough up: ? More than a little blood. ? More blood than before. Summary  Do not eat or drink anything (not even water) for 2 hours after your test, or until your numbing medicine wears off.  Do not use cigarettes. Do not use e-cigarettes.  Get help right away if you have chest pain. This information is not intended to replace advice given to you by your health care provider. Make sure you discuss any questions you have with your health care provider. Document Revised: 12/05/2016  Document Reviewed: 01/11/2016 Elsevier Patient Education  2020 [place]   1) The drugs that you were given will stay in your system until tomorrow so for the next 24 hours you should not:  A) Drive an automobile B) Make any legal decisions C) Drink any alcoholic beverage   2) You may resume regular meals tomorrow.  Today it is better to start with liquids and gradually work up to solid foods.  You may eat anything you prefer, but it is better to start with liquids, then soup and crackers, and gradually work up to solid foods.   3) Please notify your doctor immediately if you have any unusual bleeding, trouble breathing, redness and pain at the surgery site, drainage, fever, or pain not relieved by medication.    4) Additional Instructions:        Please contact your physician with any problems or Same Day Surgery at [phone], Monday through Friday 6 am to 4 pm, or [place] at [facility] number at [phone].

## 2020-01-13 NOTE — Anesthesia Procedure Notes (Addendum)
Procedure Name: Intubation Date/Time: 01/13/2020 11:59 AM Performed by: Lily Peer, Johnluke Haugen, CRNA Pre-anesthesia Checklist: Patient identified, Emergency Drugs available, Suction available and Patient being monitored Patient Re-evaluated:Patient Re-evaluated prior to induction Oxygen Delivery Method: Circle system utilized Preoxygenation: Pre-oxygenation with 100% oxygen Induction Type: IV induction Ventilation: Mask ventilation without difficulty Laryngoscope Size: McGraph and 3 Grade View: Grade I Tube size: 8.0 mm Number of attempts: 1 Placement Confirmation: ETT inserted through vocal cords under direct vision,  positive ETCO2 and breath sounds checked- equal and bilateral Secured at: 22 cm Tube secured with: Tape Dental Injury: Teeth and Oropharynx as per pre-operative assessment

## 2020-01-13 NOTE — Anesthesia Preprocedure Evaluation (Signed)
Anesthesia Evaluation  Patient identified by MRN, date of birth, ID band Patient awake    Reviewed: Allergy & Precautions, NPO status , Patient's Chart, lab work & pertinent test results  History of Anesthesia Complications (+) PONV and history of anesthetic complications  Airway Mallampati: III       Dental  (+) Teeth Intact, Dental Advidsory Given   Pulmonary neg pulmonary ROS,    Pulmonary exam normal        Cardiovascular (-) hypertension(-) angina(-) Past MI and (-) Cardiac Stents (-) dysrhythmias + Valvular Problems/Murmurs  Rhythm:Regular     Neuro/Psych PSYCHIATRIC DISORDERS Depression    GI/Hepatic Neg liver ROS, GERD  ,  Endo/Other  negative endocrine ROS  Renal/GU negative Renal ROS     Musculoskeletal negative musculoskeletal ROS (+)   Abdominal Normal abdominal exam  (+)   Peds negative pediatric ROS (+)  Hematology negative hematology ROS (+)   Anesthesia Other Findings Past Medical History: No date: Cancer ([place])     Comment:  cervical cancer No date: Chronic headaches No date: Complication of anesthesia No date: Depression No date: Heart murmur     Comment:  asymptomatic No date: Hiatal hernia with gastroesophageal reflux No date: Hypercholesterolemia No date: Migraines 04/2019: Pneumonia No date: PONV (postoperative nausea and vomiting) No date: Sarcoidosis   Reproductive/Obstetrics                             Anesthesia Physical  Anesthesia Plan  ASA: II  Anesthesia Plan: General   Post-op Pain Management:    Induction: Intravenous  PONV Risk Score and Plan: 4 or greater and Ondansetron, Dexamethasone, Scopolamine patch - Pre-op and Treatment may vary due to age or medical condition  Airway Management Planned: Oral ETT  Additional Equipment:   Intra-op Plan:   Post-operative Plan: Extubation in OR  Informed Consent: I have reviewed the patients  History and Physical, chart, labs and discussed the procedure including the risks, benefits and alternatives for the proposed anesthesia with the patient or authorized representative who has indicated his/her understanding and acceptance.       Plan Discussed with: CRNA  Anesthesia Plan Comments:         Anesthesia Quick Evaluation

## 2020-01-13 NOTE — Procedures (Signed)
ELECTROMAGNETIC NAVIGATIONAL BRONCHOSCOPY PROCEDURE NOTE  FIBEROPTIC BRONCHOSCOPY WITH BRONCHOALVEOLAR LAVAGE PROCEDURE NOTE    Flexible bronchoscopy was performed  by : Lanney Gins MD  assistance by : 1)Repiratory therapist  and 2)[employer] staff and 3) Anesthesia team and 4) Flouroscopy team and 5) [employer] supporting staff   Indication for the procedure was :  Pre-procedural H&P. The following assessment was performed on the day of the procedure prior to initiating sedation History:  Chest pain n Dyspnea y Hemoptysis n Cough y Fever n Other pertinent items n  Examination Vital signs -reviewed as per nursing documentation today Cardiac    Murmurs: n  Rubs : n  Gallop: n Lungs Wheezing: n Rales : n Rhonchi :y  Other pertinent findings: +SARCOIDOSIS  Pre-procedural assessment for Procedural Sedation included: Depth of sedation: As per anesthesia team  ASA Classification:  1 Mallampati airway assessment: 3    Medication list reviewed: y  The patient's interval history was taken and revealed: no new complaints The pre- procedure physical examination revealed: No new findings Refer to prior clinic note for details.  Informed Consent: Informed consent was obtained from:  patient after explanation of procedure and risks, benefits, as well as alternative procedures available.  Explanation of level of sedation and possible transfusion was also provided.    Procedural Preparation: Time out was performed and patient was identified by name and birthdate and procedure to be performed and side for sampling, if any, was specified. Pt was intubated by anesthesia.  The patient was appropriately draped.   Fiberoptic bronchoscopy with airway inspection and BAL Procedure findings:  Bronchoscope was inserted via ETT  without difficulty.  Posterior oropharynx, epiglottis, arytenoids, false cords and vocal cords were not visualized as these were bypassed by endotracheal  tube. The distal trachea was normal in circumference and appearance without mucosal, cartilaginous or branching abnormalities.  The main carina was mildly splayed . All right and left lobar airways were visualized to the Subsegmental level.  Sub- sub segmental carinae were identified in all the distal airways.   Secretions were visible in the following airways and appeared to be clear.  The mucosa was : normal   Airways were notable for:        exophytic lesions :n       extrinsic compression in the following distributions: n.       Friable mucosa: no       Anthrocotic material /pigmentation: n     Post procedure Diagnosis:   Mucus plugging with benight bronchial cells and inflammatory cells     Electromagnetic Navigational Bronchoscopy Procedure Findings:  After appropriate CT-guided planning ENB scope was advanced via endotracheal tube and LG was advanced for registration.  Post appropriate planning and registration peripheral navigation was used to visualize target lesion.     LG was positioned at RLL to taget lesion.    Cytobrush performed x 3 and surgical forceps biopsy x 5  Post procedure diagnosis: Mucus plugging with benign bronchial cells and inflammatory cells.      Specimens obtained included:                    Cytology brushes : cytology   Broncho-alveolar lavage site:RLL   sent for cytology                              44m volume infused 341mvolume returned with serosang and mucoid appearance   Fluoroscopy Used:  yes ;        Pictorial documentation attached: none                   With secondary sampling in none additional lobes  Immediate sampling complications included:none  Epinephrine zero ml was used topically  The bronchoscopy was terminated due to completion of the planned procedure and the bronchoscope was removed.   Total dosage of Lidocaine was zero mg Total fluoroscopy time was 1.6  minutes   Estimated Blood loss: 1  cc.  Complications included:  none immediate     Disposition: Home with husband LEE Prehn.  I waited for husband in waiting room for some time but he was unavailable,  have called husband and left voice mail.   Follow up with Dr. Lanney Gins in 5 days for result discussion.     Ottie Glazier MD  [facility] Division of Pulmonary & Critical Care Medicine
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
## 2020-01-13 NOTE — Anesthesia Postprocedure Evaluation (Signed)
Anesthesia Post Note  Patient: Christy Escobar  Procedure(s) Performed: VIDEO BRONCHOSCOPY WITH ENDOBRONCHIAL NAVIGATION (N/A )  Patient location during evaluation: PACU Anesthesia Type: General Level of consciousness: awake and alert Pain management: pain level controlled Vital Signs Assessment: post-procedure vital signs reviewed and stable Respiratory status: spontaneous breathing, nonlabored ventilation, respiratory function stable and patient connected to nasal cannula oxygen Cardiovascular status: blood pressure returned to baseline and stable Postop Assessment: no apparent nausea or vomiting Anesthetic complications: no   No complications documented.   Last Vitals:  Vitals:   01/13/20 1337 01/13/20 1352  BP: (!) 102/56 (!) 103/48  Pulse: 76   Resp: 13   Temp:  36.7 C  SpO2: 100%     Last Pain:  Vitals:   01/13/20 1337  TempSrc:   PainSc: 0-No pain                 Martha Clan

## 2020-01-13 NOTE — Transfer of Care (Signed)
Immediate Anesthesia Transfer of Care Note  Patient: Christy Escobar  Procedure(s) Performed: VIDEO BRONCHOSCOPY WITH ENDOBRONCHIAL NAVIGATION (N/A )  Patient Location: PACU  Anesthesia Type:General  Level of Consciousness: drowsy  Airway & Oxygen Therapy: Patient Spontanous Breathing and Patient connected to face mask oxygen  Post-op Assessment: Report given to RN and Post -op Vital signs reviewed and stable  Post vital signs: Reviewed and stable  Last Vitals:  Vitals Value Taken Time  BP 113/58   Temp    Pulse 73 01/13/20 1308  Resp 18 01/13/20 1308  SpO2 100 % 01/13/20 1308  Vitals shown include unvalidated device data.  Last Pain:  Vitals:   01/13/20 1026  TempSrc: Oral  PainSc: 0-No pain         Complications: No complications documented.

## 2020-01-16 ENCOUNTER — Encounter: Payer: Self-pay | Admitting: Pulmonary Disease

## 2020-01-16 LAB — SURGICAL PATHOLOGY

## 2020-01-16 LAB — CYTOLOGY - NON PAP

## 2020-02-08 ENCOUNTER — Ambulatory Visit: Payer: Managed Care, Other (non HMO) | Admitting: Pharmacist

## 2020-02-08 VITALS — Wt 155.0 lb

## 2020-02-08 DIAGNOSIS — Z6827 Body mass index (BMI) 27.0-27.9, adult: Secondary | ICD-10-CM

## 2020-02-08 DIAGNOSIS — E7800 Pure hypercholesterolemia, unspecified: Secondary | ICD-10-CM

## 2020-02-08 DIAGNOSIS — R739 Hyperglycemia, unspecified: Secondary | ICD-10-CM

## 2020-02-08 NOTE — Chronic Care Management (AMB) (Signed)
Care Management   Pharmacy Note  02/08/2020 Name: Christy Escobar MRN: [id number] DOB: [birth year]  Subjective: Christy Escobar is a 70 y.o. year old female who is a primary care patient of Einar Pheasant, MD. The Care Management team was consulted for assistance with care management and care coordination needs.    Engaged with patient by telephone for follow up visit in response to provider referral for pharmacy case management and/or care coordination services.   The patient was given information about Care Management services today including:  1. Care Management services includes personalized support from designated clinical staff supervised by the patient's primary care provider, including individualized plan of care and coordination with other care providers. 2. 24/7 contact phone numbers for assistance for urgent and routine care needs. 3. The patient may stop case management services at any time by phone call to the office staff.  Patient agreed to services and consent obtained.  Assessment:  Review of patient status, including review of consultants reports, laboratory and other test data, was performed as part of comprehensive evaluation and provision of chronic care management services.   SDOH (Social Determinants of Health) assessments and interventions performed:  SDOH Interventions   Flowsheet Row Most Recent Value  SDOH Interventions   Financial Strain Interventions Intervention Not Indicated  Physical Activity Interventions Other (Comments)  [extensive discussion regarding setting exercise goal]       Objective:  Lab Results  Component Value Date   CREATININE 0.86 12/23/2019   CREATININE 0.70 07/12/2019   CREATININE 0.84 05/31/2019    Lab Results  Component Value Date   HGBA1C 5.4 12/23/2019       Component Value Date/Time   CHOL 155 12/23/2019 0932   TRIG 173 (H) 12/23/2019 0932   HDL 46 12/23/2019 0932   CHOLHDL 4.7 (H) 05/31/2019 0925   LDLCALC 80  12/23/2019 0932       BP Readings from Last 3 Encounters:  01/13/20 126/66  11/24/19 126/70  08/23/19 120/70    Care Plan  Allergies  Allergen Reactions  . Clarithromycin Nausea Only and Other (See Comments)    Nausea, GI burning     Medications Reviewed Today    Reviewed by De Hollingshead, RPH-CPP (Pharmacist) on 02/08/20 at 1010  Med List Status: <None>  Medication Order Taking? Sig Documenting Provider Last Dose Status Informant  amoxicillin (AMOXIL) 500 MG tablet [id number]  Take 500 mg by mouth as needed. PRIOR TO DENTAL PROCEDURES [provider]  Active   estradiol (ESTRACE) 1 MG tablet [id number] Yes Take 1 tablet (1 mg total) by mouth daily. Einar Pheasant, MD Taking Active Self  finasteride (PROSCAR) 5 MG tablet [id number] Yes Take 2.5 mg by mouth every evening. [provider] Taking Active Self  fish oil-omega-3 fatty acids 1000 MG capsule [id number] Yes Take 1 g by mouth 3 (three) times daily. [provider] Taking Active Self  fluticasone (FLONASE) 50 MCG/ACT nasal spray [id number] Yes USE TWO SPRAY(S) IN EACH NOSTRIL ONCE DAILY  Patient taking differently: Place 1 spray into both nostrils daily as needed for allergies.   Einar Pheasant, MD Taking Active Self  minoxidil (LONITEN) 2.5 MG tablet [id number] Yes Take 1.25 mg by mouth every morning. TAKES FOR HAIR LOSS [provider] Taking Active Self  omeprazole (PRILOSEC) 20 MG capsule [id number] Yes Take 1 capsule (20 mg total) by mouth 2 (two) times daily before a meal.  Patient taking differently: Take 20 mg by mouth every morning.   Jimmye Norman,  Algis Liming, MD Taking Active   rosuvastatin (CRESTOR) 10 MG tablet [id number] Yes Take 1 tablet (10 mg total) by mouth daily.  Patient taking differently: Take 10 mg by mouth every evening.   Einar Pheasant, MD Taking Active Self  Semaglutide, 1 MG/DOSE, (OZEMPIC, 1 MG/DOSE,) 2 MG/1.5ML SOPN [id number] Yes Inject 0.75 mLs (1 mg total) into  the skin once a week. Einar Pheasant, MD Taking Active Self  sertraline (ZOLOFT) 50 MG tablet [id number] Yes Take 1 tablet by mouth once daily Einar Pheasant, MD Taking Active   valACYclovir (VALTREX) 1000 MG tablet [id number] Yes Take 1,000 mg by mouth daily as needed (outbreaks). [provider] Taking Active Self  zolmitriptan (ZOMIG) 5 MG tablet [id number] Yes TAKE 1/2 (ONE-HALF) TABLET BY MOUTH ONCE DAILY AS NEEDED  Patient taking differently: Take 2.5 mg by mouth daily as needed for migraine.   Einar Pheasant, MD Taking Active Self          Patient Active Problem List   Diagnosis Date Noted  . Sinus congestion 11/20/2019  . Low back pain 08/29/2019  . Weight loss 05/21/2019  . CAP (community acquired pneumonia) 04/18/2019  . 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 Abdominal pain 04/14/2019  . Left ovarian cyst 09/24/2018  . DOE (dyspnea on exertion) 08/28/2018  . 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 Depression 02/01/2018  . History of traumatic injury to musculoskeletal system 02/01/2018  . Migraine headache 02/01/2018  . Onychomycosis due to dermatophyte 02/01/2018  . 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 Synovitis and tenosynovitis, unspecified 02/01/2018  . Hair loss 08/09/2017  . Tick bite 04/18/2017  . Stress incontinence in female 05/27/2016  . BMI 32.0-32.9,adult 08/01/2015  . Hyperglycemia 08/01/2015  . History of colonic polyps 12/20/2014  . Plantar fasciitis 12/04/2014  . Chest pain 09/03/2014  . Health care maintenance 07/06/2014  . Stress 08/01/2013  . Vaginal dryness 05/08/2013  . Elevated blood pressure 12/02/2012  . Menopausal syndrome 03/21/2012  . GERD (gastroesophageal reflux disease) 03/21/2012  . Abnormal liver function test 03/21/2012  . Sarcoidosis 12/16/2011  . Hypercholesterolemia 12/16/2011  . Urinary incontinence 12/16/2011  . Osteopenia 12/16/2011    Conditions to be addressed/monitored: HTN, HLD and  overweight, hyperglycemia  Care Plan : Medication Management  Updates made by De Hollingshead, RPH-CPP since 02/08/2020 12:00 AM    Problem: Diabetes, Obesity     Long-Range Goal: Disease Progression Prevention   Recent Progress: On track  Priority: High  Note:   Current Barriers:  . Unable to achieve control of weight  . Diagnosis of hyperglycemia/prediabetes  Pharmacist Clinical Goal(s):  Marland Kitchen Over the next 90 days, patient will maintain control of weight through collaboration with PharmD and provider.   Interventions: . 1:1 collaboration with Einar Pheasant, MD regarding development and update of comprehensive plan of care as evidenced by provider attestation and co-signature . Inter-disciplinary care team collaboration (see longitudinal plan of care) . Comprehensive medication review performed; medication list updated in electronic medical record  Weight Management/Hyperglycemia: . Improved with treatment; current treatment: Ozempic 1 mg weekly- insurance did not cover weight loss medication, using GLP1 for hyperglycemia, weight management, prevention of progression to T2DM . Baseline weight prior to therapy: 184 lbs . Most recent home weight: 155 lbs  . Current weight loss total on therapy: 29 lbs (~15.8% weight loss from baseline) . Current meal patterns: notes that she feels she has regained some appetite. Discussed principals of increasing fiber intake, water intake and focus  on filling up more on fruits/vegetables to avoid increasing snacking . Current physical activity: little lately d/t the cold weather. Recognizes that she needs to get back into a routine.  . Patient feels like she has reached a plateau with her weight. Discussed importance of maintaining principals of appropriate portion sizes. Discussed importance of getting back into an exercise routine. Discussed setting an attainable goal. Decided to target ~2-3 days of walking on the treadmill 20-30 minutes. Patient in  agreement. . Reviewed formulary. Weight loss medication [facility], opportunity for higher doses than Ozempic) still not covered by plan  Hyperlipidemia: . Controlled per last lab work; current treatment: rosuvastatin 10 mg daily  . Recommended to continue current therapy  Depression: . Currently well managed per patient report; current treatment: sertraline 50 mg daily . Acknowledge that SSRI may increase risk of weight gain, though benefit may outweigh risk . Recommended to continue current therapy  Migraines: . Currently well managed; current treatment: zolmitriptan 5 mg PRN, no recent use . Recommend to continue current regimen   Dermatologic Conditions . Appropriately managed by dermatology; current treatment: minoxidil 1.25 mg daily, finasteride 2.5 mg daily  . Recommend to continue current regimen  Patient Goals/Self-Care Activities . Over the next 90 days, patient will:  - take medications as prescribed target a minimum of 150 minutes of moderate intensity exercise weekly engage in dietary modifications by continuing to monitor portion sizes  Follow Up Plan: Telephone follow up appointment with care management team member scheduled for:~ 6 weeks     Medication Assistance:  None required.  Patient affirms current coverage meets needs.  Follow Up:  Patient agrees to Care Plan and Follow-up.  Plan: Telephone follow up appointment with care management team member scheduled for:  ~ 6 weeks  Catie Darnelle Maffucci, PharmD, [place], [place] Clinical Pharmacist [employer] [facility] [employer] [phone]

## 2020-02-08 NOTE — Patient Instructions (Signed)
Visit Information  Goals Addressed              This Visit's Progress     Patient Stated   .  Medication Monitoring (pt-stated)        Patient Goals/Self-Care Activities . Over the next 90 days, patient will:  - take medications as prescribed target a minimum of 150 minutes of moderate intensity exercise weekly engage in dietary modifications by continuing to monitor portion sizes        Patient verbalizes understanding of instructions provided today and agrees to view in [place].  Plan: Telephone follow up appointment with care management team member scheduled for:  ~ 6 weeks  Catie Darnelle Maffucci, PharmD, [place], [place] Clinical Pharmacist [employer] [facility] [employer] [phone]

## 2020-02-13 ENCOUNTER — Other Ambulatory Visit: Payer: Self-pay

## 2020-02-13 ENCOUNTER — Ambulatory Visit: Payer: Managed Care, Other (non HMO) | Admitting: Nurse Practitioner

## 2020-02-13 VITALS — BP 128/82 | HR 83 | Temp 97.0°F | Resp 16 | Ht 64.0 in | Wt 150.0 lb

## 2020-02-13 DIAGNOSIS — Z0000 Encounter for general adult medical examination without abnormal findings: Secondary | ICD-10-CM

## 2020-02-13 DIAGNOSIS — Z008 Encounter for other general examination: Secondary | ICD-10-CM

## 2020-02-13 NOTE — Patient Instructions (Signed)
Health Maintenance After Age 70 After age 27, you are at a higher risk for certain long-term diseases and infections as well as injuries from falls. Falls are a major cause of broken bones and head injuries in people who are older than age 84. Getting regular preventive care can help to keep you healthy and well. Preventive care includes getting regular testing and making lifestyle changes as recommended by your health care provider. Talk with your health care provider about:  Which screenings and tests you should have. A screening is a test that checks for a disease when you have no symptoms.  A diet and exercise plan that is right for you. What should I know about screenings and tests to prevent falls? Screening and testing are the best ways to find a health problem early. Early diagnosis and treatment give you the best chance of managing medical conditions that are common after age 61. Certain conditions and lifestyle choices may make you more likely to have a fall. Your health care provider may recommend:           Practice standing on one foot and other exercises to improve           balance and help prevent falls.   Regular vision checks. Poor vision and conditions such as cataracts can make you more likely to have a fall. If you wear glasses, make sure to get your prescription updated if your vision changes.  Medicine review. Work with your health care provider to regularly review all of the medicines you are taking, including over-the-counter medicines. Ask your health care provider about any side effects that may make you more likely to have a fall. Tell your health care provider if any medicines that you take make you feel dizzy or sleepy.  Osteoporosis screening. Osteoporosis is a condition that causes the bones to get weaker. This can make the bones weak and cause them to break more easily.  Blood pressure screening. Blood pressure changes and medicines to control blood pressure can make  you feel dizzy.  Strength and balance checks. Your health care provider may recommend certain tests to check your strength and balance while standing, walking, or changing positions.  Foot health exam. Foot pain and numbness, as well as not wearing proper footwear, can make you more likely to have a fall.  Depression screening. You may be more likely to have a fall if you have a fear of falling, feel emotionally low, or feel unable to do activities that you used to do.  Alcohol use screening. Using too much alcohol can affect your balance and may make you more likely to have a fall. What actions can I take to lower my risk of falls? General instructions  Talk with your health care provider about your risks for falling. Tell your health care provider if: ? You fall. Be sure to tell your health care provider about all falls, even ones that seem minor. ? You feel dizzy, sleepy, or off-balance.  Take over-the-counter and prescription medicines only as told by your health care provider. These include any supplements.  Eat a healthy diet and maintain a healthy weight. A healthy diet includes low-fat dairy products, low-fat (lean) meats, and fiber from whole grains, beans, and lots of fruits and vegetables. Home safety  Remove any tripping hazards, such as rugs, cords, and clutter.  Install safety equipment such as grab bars in bathrooms and safety rails on stairs.  Keep rooms and walkways well-lit. Activity  Follow a regular exercise program to stay fit. This will help you maintain your balance. Ask your health care provider what types of exercise are appropriate for you.  If you need a cane or walker, use it as recommended by your health care provider.  Wear supportive shoes that have nonskid soles.   Lifestyle  Do not drink alcohol if your health care provider tells you not to drink.  If you drink alcohol, limit how much you have: ? 0-1 drink a day for women. ? 0-2 drinks a day for  men.  Be aware of how much alcohol is in your drink. In the [place] one drink equals one typical bottle of beer (12 oz), one-half glass of wine (5 oz), or one shot of hard liquor (1 oz).  Do not use any products that contain nicotine or tobacco, such as cigarettes and e-cigarettes. If you need help quitting, ask your health care provider. Summary  Having a healthy lifestyle and getting preventive care can help to protect your health and wellness after age 81.  Screening and testing are the best way to find a health problem early and help you avoid having a fall. Early diagnosis and treatment give you the best chance for managing medical conditions that are more common for people who are older than age 24.  Falls are a major cause of broken bones and head injuries in people who are older than age 73. Take precautions to prevent a fall at home.  Work with your health care provider to learn what changes you can make to improve your health and wellness and to prevent falls. This information is not intended to replace advice given to you by your health care provider. Make sure you discuss any questions you have with your health care provider. Document Revised: 04/15/2018 Document Reviewed: 11/05/2016 Elsevier Patient Education  2021 [place] protect organs, store calcium, anchor muscles, and support the whole body. Keeping your bones strong is important, especially as you get older. You can take actions to help keep your bones strong and healthy. Why is keeping my bones healthy important? Keeping your bones healthy is important because your body constantly replaces bone cells. Cells get old, and new cells take their place. As we age, we lose bone cells because the body may not be able to make enough new cells to replace the old cells. The amount of bone cells and bone tissue you have is referred to as bone mass. The higher your bone mass, the stronger your bones. The aging  process leads to an overall loss of bone mass in the body, which can increase the likelihood of:  Joint pain and stiffness.  Broken bones.  A condition in which the bones become weak and brittle (osteoporosis). A large decline in bone mass occurs in older adults. In women, it occurs about the time of menopause.   What actions can I take to keep my bones healthy? Good health habits are important for maintaining healthy bones. This includes eating nutritious foods and exercising regularly. To have healthy bones, you need to get enough of the right minerals and vitamins. Most nutrition experts recommend getting these nutrients from the foods that you eat. In some cases, taking supplements may also be recommended. Doing certain types of exercise is also important for bone health. What are the nutritional recommendations for healthy bones? Eating a well-balanced diet with plenty of calcium and vitamin D will help to protect your bones. Nutritional  recommendations vary from person to person. Ask your health care provider what is healthy for you. Here are some general guidelines. Get enough calcium Calcium is the most important (essential) mineral for bone health. Most people can get enough calcium from their diet, but supplements may be recommended for people who are at risk for osteoporosis. Good sources of calcium include:  Dairy products, such as low-fat or nonfat milk, cheese, and yogurt.  Dark green leafy vegetables, such as bok choy and broccoli.  Calcium-fortified foods, such as orange juice, cereal, bread, soy beverages, and tofu products.  Nuts, such as almonds. Follow these recommended amounts for daily calcium intake:  Children, age 27-3: 700 mg.  Children, age 31-8: 1,000 mg.  Children, age 71-13: 1,300 mg.  Teens, age 72-18: 1,300 mg.  Adults, age 28-50: 1,000 mg.  Adults, age 311-70: ? Men: 1,000 mg. ? Women: 1,200 mg.  Adults, age 39 or older: 1,200 mg.  Pregnant and  breastfeeding females: ? Teens: 1,300 mg. ? Adults: 1,000 mg. Get enough vitamin D Vitamin D is the most essential vitamin for bone health. It helps the body absorb calcium. Sunlight stimulates the skin to make vitamin D, so be sure to get enough sunlight. If you live in a cold climate or you do not get outside often, your health care provider may recommend that you take vitamin D supplements. Good sources of vitamin D in your diet include:  Egg yolks.  Saltwater fish.  Milk and cereal fortified with vitamin D. Follow these recommended amounts for daily vitamin D intake:  Children and teens, age 27-18: 600 international units.  Adults, age 77 or younger: 400-800 international units.  Adults, age 33 or older: 800-1,000 international units. Get other important nutrients Other nutrients that are important for bone health include:  Phosphorus. This mineral is found in meat, poultry, dairy foods, nuts, and legumes. The recommended daily intake for adult men and adult women is 700 mg.  Magnesium. This mineral is found in seeds, nuts, dark green vegetables, and legumes. The recommended daily intake for adult men is 400-420 mg. For adult women, it is 310-320 mg.  Vitamin K. This vitamin is found in green leafy vegetables. The recommended daily intake is 120 mg for adult men and 90 mg for adult women.   What type of physical activity is best for building and maintaining healthy bones? Weight-bearing and strength-building activities are important for building and maintaining healthy bones. Weight-bearing activities cause muscles and bones to work against gravity. Strength-building activities increase the strength of the muscles that support bones. Weight-bearing and muscle-building activities include:  Walking and hiking.  Jogging and running.  Dancing.  Gym exercises.  Lifting weights.  Tennis and racquetball.  Climbing stairs.  Aerobics. Adults should get at least 30 minutes of  moderate physical activity on most days. Children should get at least 60 minutes of moderate physical activity on most days. Ask your health care provider what type of exercise is best for you.   How can I find out if my bone mass is low? Bone mass can be measured with an X-ray test called a bone mineral density (BMD) test. This test is recommended for all women who are age 274 or older. It may also be recommended for:  Men who are age 8 or older.  People who are at risk for osteoporosis because of: ? Having bones that break easily. ? Having a long-term disease that weakens bones, such as kidney disease or rheumatoid arthritis. ?  Having menopause earlier than normal. ? Taking medicine that weakens bones, such as steroids, thyroid hormones, or hormone treatment for breast cancer or prostate cancer. ? Smoking. ? Drinking three or more alcoholic drinks a day. If you find that you have a low bone mass, you may be able to prevent osteoporosis or further bone loss by changing your diet and lifestyle. Where can I find more information? For more information, check out the following websites:  [place]: AviationTales.fr  [employer] of Health: www.bones.SouthExposed.es  International Osteoporosis Foundation: Administrator.iofbonehealth.org Summary  The aging process leads to an overall loss of bone mass in the body, which can increase the likelihood of broken bones and osteoporosis.  Eating a well-balanced diet with plenty of calcium and vitamin D will help to protect your bones.  Weight-bearing and strength-building activities are also important for building and maintaining strong bones.  Bone mass can be measured with an X-ray test called a bone mineral density (BMD) test. This information is not intended to replace advice given to you by your health care provider. Make sure you discuss any questions you have with your health care provider. Document Revised: 01/19/2017  Document Reviewed: 01/19/2017 Elsevier Patient Education  2021 [employer].
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
## 2020-02-14 LAB — LIPID PANEL
Chol/HDL Ratio: 3.4 ratio (ref 0.0–4.4)
Cholesterol, Total: 166 mg/dL (ref 100–199)
HDL: 49 mg/dL (ref >39)
LDL Chol Calc (NIH): 90 mg/dL (ref 0–99)
Triglycerides: 159 mg/dL — ABNORMAL HIGH (ref 0–149)
VLDL Cholesterol Cal: 27 mg/dL (ref 5–40)

## 2020-02-14 LAB — GLUCOSE, RANDOM: Glucose: 69 mg/dL (ref 65–99)

## 2020-03-29 ENCOUNTER — Telehealth: Payer: Managed Care, Other (non HMO)

## 2020-03-29 ENCOUNTER — Ambulatory Visit: Payer: Managed Care, Other (non HMO) | Admitting: Internal Medicine

## 2020-03-29 ENCOUNTER — Telehealth: Payer: Self-pay | Admitting: Pharmacist

## 2020-03-29 NOTE — Telephone Encounter (Signed)
  Chronic Care Management   Note  03/29/2020 Name: Christy Escobar MRN: [id number] DOB: 1950/01/15   Contacted patient for scheduled medication management call. With her particular insurance plan, she would have a $20 copay for CCM services. Discussed this with her. Given stability at this time, she would like to declined continued CCM enrolleent. She will let us know moving forward if she is interested in re-enrolling.   Catie Darnelle Maffucci, PharmD, [place], [place] Clinical Pharmacist [employer] [facility] [place]

## 2020-04-09 ENCOUNTER — Encounter: Payer: Self-pay | Admitting: Internal Medicine

## 2020-04-09 ENCOUNTER — Other Ambulatory Visit: Payer: Self-pay

## 2020-04-09 ENCOUNTER — Ambulatory Visit (INDEPENDENT_AMBULATORY_CARE_PROVIDER_SITE_OTHER): Payer: Managed Care, Other (non HMO) | Admitting: Internal Medicine

## 2020-04-09 VITALS — BP 120/68 | HR 87 | Temp 97.4°F | Resp 16 | Ht 64.0 in | Wt 152.6 lb

## 2020-04-09 DIAGNOSIS — R945 Abnormal results of liver function studies: Secondary | ICD-10-CM

## 2020-04-09 DIAGNOSIS — F439 Reaction to severe stress, unspecified: Secondary | ICD-10-CM | POA: Diagnosis not present

## 2020-04-09 DIAGNOSIS — M5441 Lumbago with sciatica, right side: Secondary | ICD-10-CM | POA: Diagnosis not present

## 2020-04-09 DIAGNOSIS — K219 Gastro-esophageal reflux disease without esophagitis: Secondary | ICD-10-CM

## 2020-04-09 DIAGNOSIS — D869 Sarcoidosis, unspecified: Secondary | ICD-10-CM | POA: Diagnosis not present

## 2020-04-09 DIAGNOSIS — R7989 Other specified abnormal findings of blood chemistry: Secondary | ICD-10-CM

## 2020-04-09 DIAGNOSIS — R739 Hyperglycemia, unspecified: Secondary | ICD-10-CM

## 2020-04-09 DIAGNOSIS — Z1211 Encounter for screening for malignant neoplasm of colon: Secondary | ICD-10-CM

## 2020-04-09 DIAGNOSIS — E7800 Pure hypercholesterolemia, unspecified: Secondary | ICD-10-CM

## 2020-04-09 DIAGNOSIS — Z86010 Personal history of colonic polyps: Secondary | ICD-10-CM

## 2020-04-09 MED ORDER — TIZANIDINE HCL 2 MG PO TABS: 2.0000 mg | ORAL_TABLET | Freq: Every evening | ORAL | 0 refills | Status: DC | PRN

## 2020-04-09 NOTE — Assessment & Plan Note (Signed)
Has been followed by pulmonary.  Stable.   
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
## 2020-04-09 NOTE — Assessment & Plan Note (Signed)
Has adjusted diet.  Lost weight.  Follow liver function tests.

## 2020-04-09 NOTE — Assessment & Plan Note (Signed)
No upper symptoms reported.  On omeprazole.  

## 2020-04-09 NOTE — Assessment & Plan Note (Signed)
Colonoscopy 12/2014.  Recommended f/u in 5 years.  Agreeable to referral.

## 2020-04-09 NOTE — Assessment & Plan Note (Signed)
Low carb diet and exercise.  Follow met b and a1c.

## 2020-04-09 NOTE — Assessment & Plan Note (Signed)
Continue crestor.  Low cholesterol diet and exercise. Follow lipid panel and liver function tests.   

## 2020-04-09 NOTE — Assessment & Plan Note (Signed)
Continue zoloft.  Stable.  Follow.   

## 2020-04-09 NOTE — Assessment & Plan Note (Signed)
Low back pain as outlined.  Exam as outlined.  Appears to be msk in origin.  Tizanidine as directed.  Avoid driving.  Call with update.

## 2020-04-17 IMAGING — CT CT HEART SCORING
2 series · 16 of 20 positions shown, 18 images · non-contrast
Comparison: CT chest 10/30/2010.
COMPARISON: CT chest 10/30/2010.

Addendum:
EXAM:
OVER-READ INTERPRETATION CT CHEST

The following report is an over-read performed by radiologist Dr.
over-read does not include interpretation of cardiac or coronary
anatomy or pathology. The coronary calcium score/coronary CTA
interpretation by the cardiologist is attached.
CLINICAL DATA: Risk stratification
Coronary Calcium Score
TECHNIQUE: The patient was scanned on a Siemens Force scanner. Axial
non-contrast 3 mm slices were carried out through the heart. The
data set was analyzed on a dedicated work station and scored using
the Agatson method.

[Series 2: casc 3.0 i36f 2 bestdiast 64 % · axial · 0.33mm/px · z∈[-223,-127]mm · 8 of 42 slices shown, 10 images]
[im 5/42  vessel]
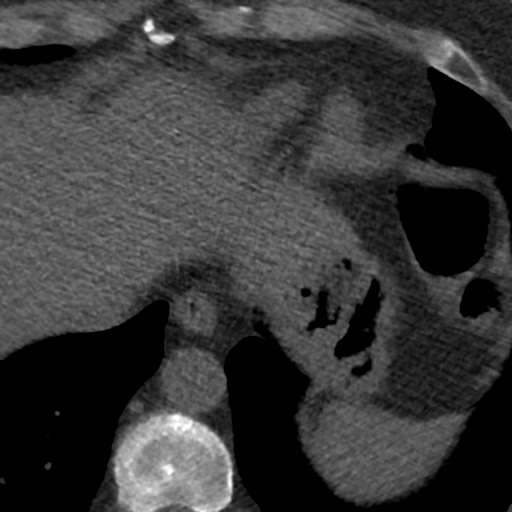
[im 5/42  lung]
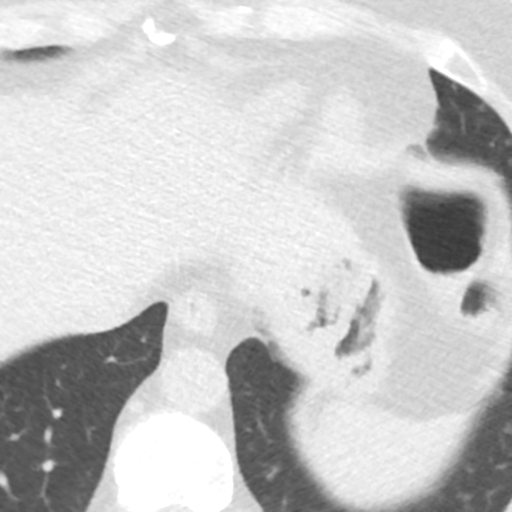
[im 10/42  vessel]
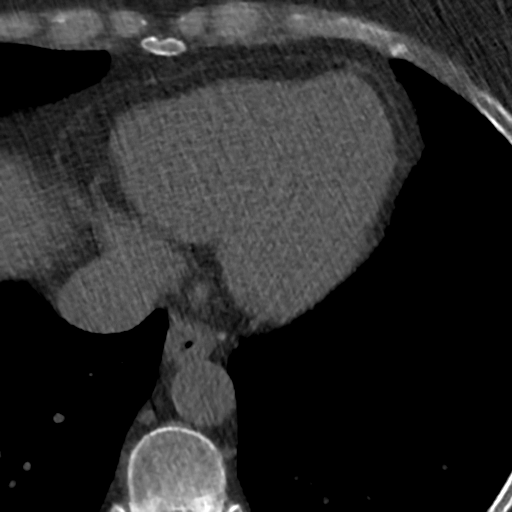
[im 14/42  vessel]
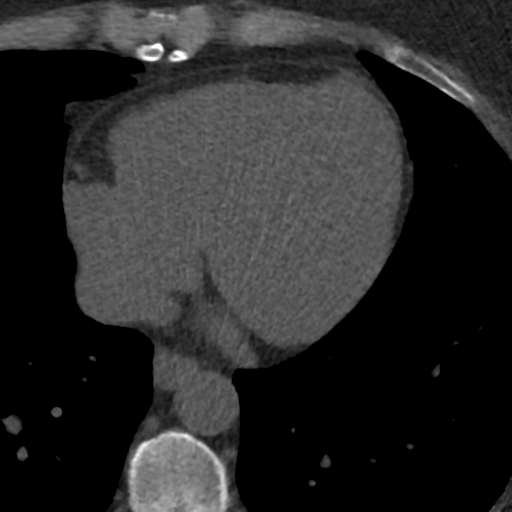
[im 19/42  vessel]
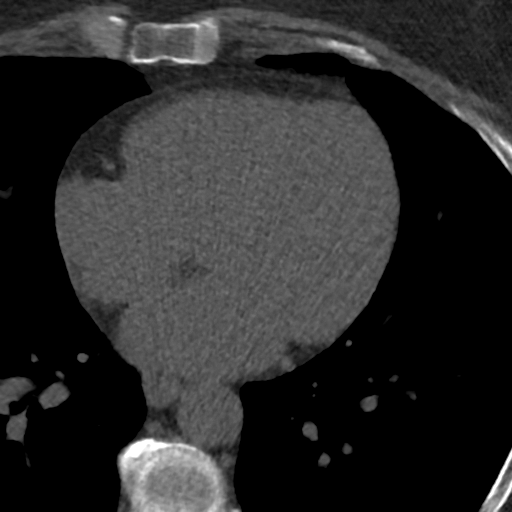
[im 23/42  vessel]
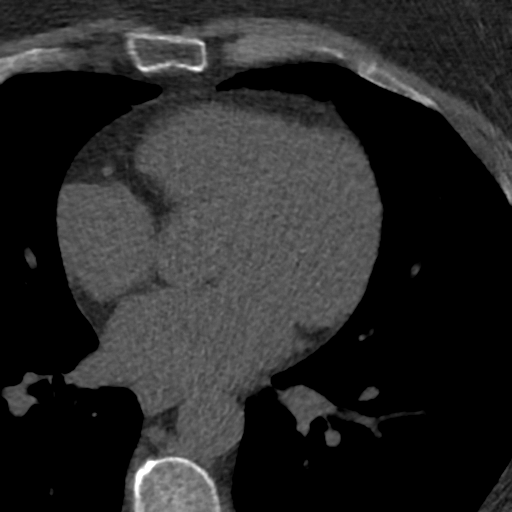
[im 23/42  lung]
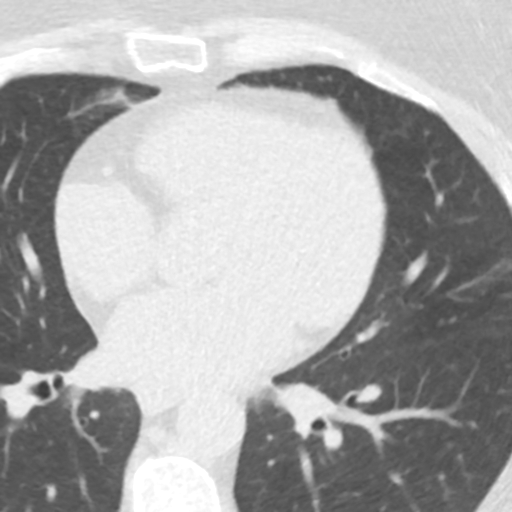
[im 28/42  vessel]
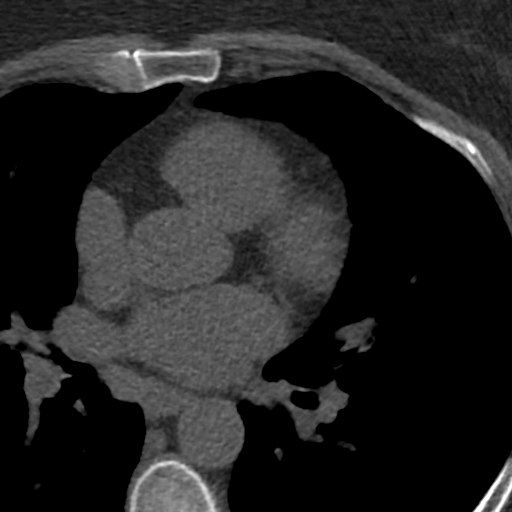
[im 32/42  vessel]
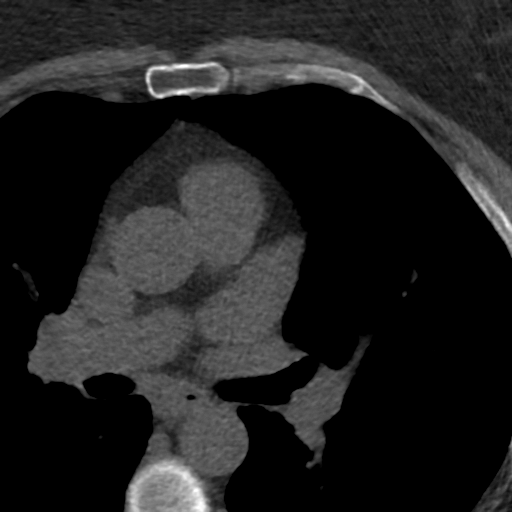
[im 37/42  vessel]
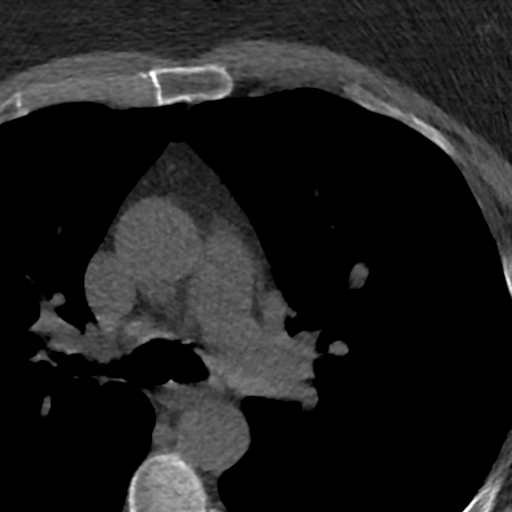

[Series 4: lung st 64 % · axial · 0.66mm/px · z∈[-222,-126]mm · 8 of 42 slices shown]
[im 5/42  lung]
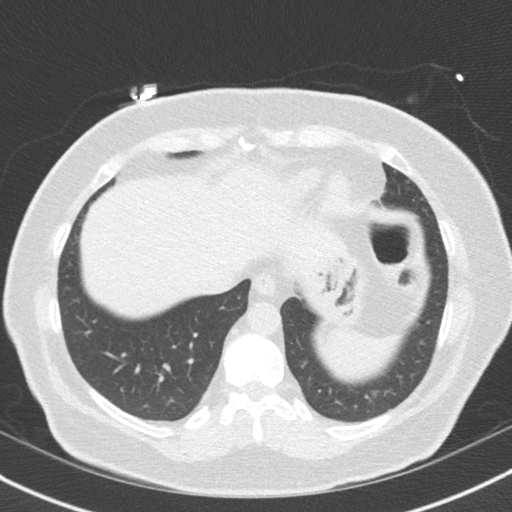
[im 10/42  lung]
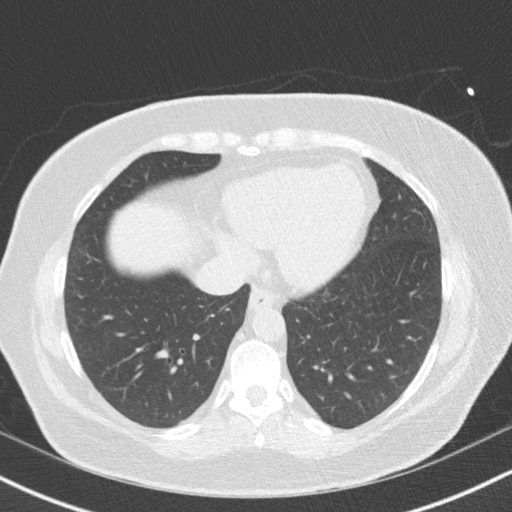
[im 14/42  lung]
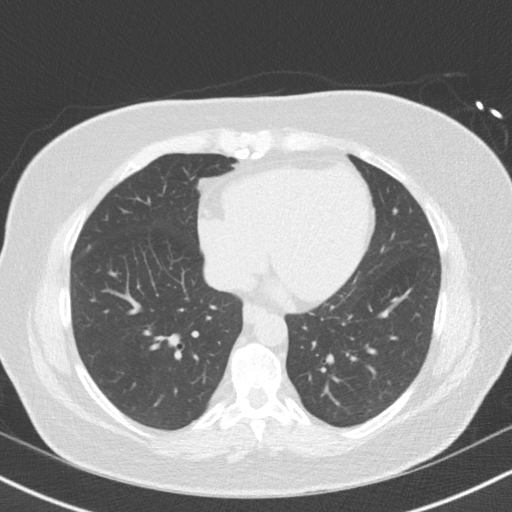
[im 19/42  lung]
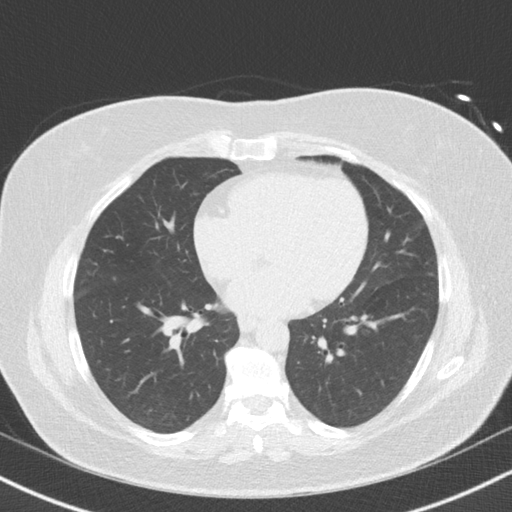
[im 23/42  lung]
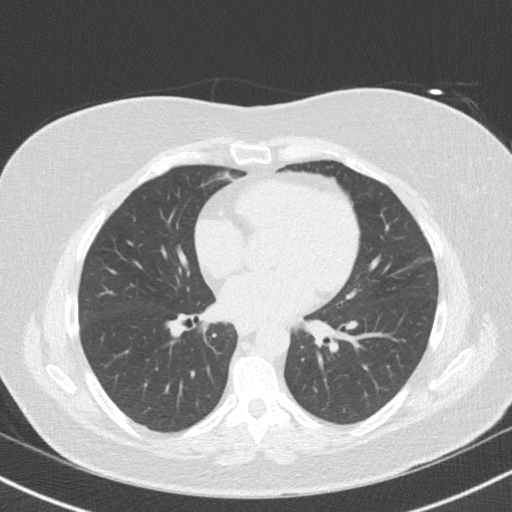
[im 28/42  lung]
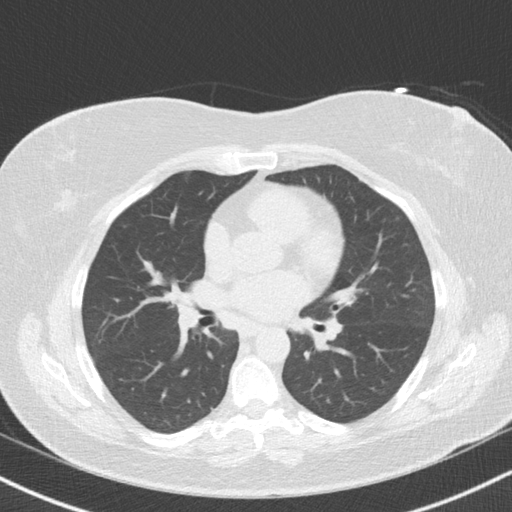
[im 32/42  lung]
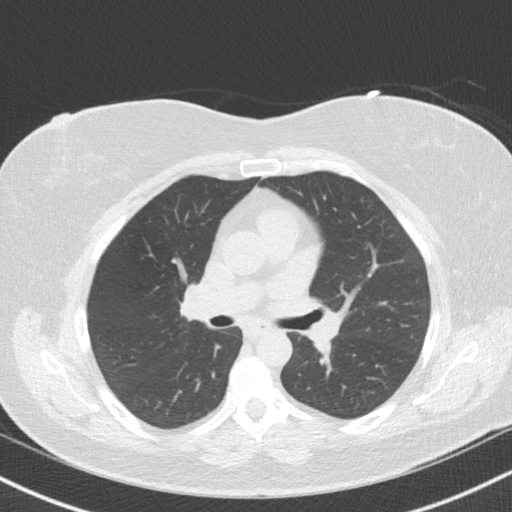
[im 37/42  lung]
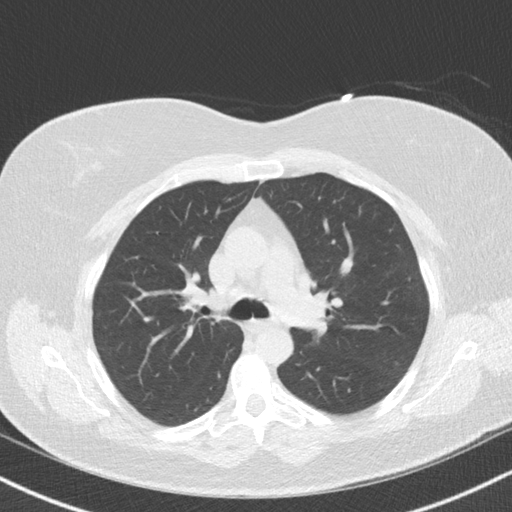

[16 of 20 positions shown; findings below may reference images not displayed]

FINDINGS: Vascular: No visible aortic atherosclerosis. No evidence of aortic
aneurysm.

Mediastinum/Nodes: No pathologic lymphadenopathy within the
visualized mediastinum. Visualized esophagus normal in appearance.

Lungs/Pleura: Visualized lung parenchyma clear. Central bronchi
patent without significant bronchial wall thickening. No pleural
effusions.

Upper Abdomen: Visualized upper abdomen unremarkable for the
unenhanced technique.

Musculoskeletal: Degenerative disc disease and spondylosis at what I
believe is T11-12.
IMPRESSION: No significant extracardiac findings.
FINDINGS: Non-cardiac: See separate report from [REDACTED].

Ascending Aorta: Normal caliber.

Pericardium: Normal.

Coronary arteries: Normal origins.
IMPRESSION: Coronary calcium score of 0.

*** End of Addendum ***
EXAM:
OVER-READ INTERPRETATION CT CHEST

The following report is an over-read performed by radiologist Dr.
over-read does not include interpretation of cardiac or coronary
anatomy or pathology. The coronary calcium score/coronary CTA
interpretation by the cardiologist is attached.
FINDINGS: Vascular: No visible aortic atherosclerosis. No evidence of aortic
aneurysm.

Mediastinum/Nodes: No pathologic lymphadenopathy within the
visualized mediastinum. Visualized esophagus normal in appearance.

Lungs/Pleura: Visualized lung parenchyma clear. Central bronchi
patent without significant bronchial wall thickening. No pleural
effusions.

Upper Abdomen: Visualized upper abdomen unremarkable for the
unenhanced technique.

Musculoskeletal: Degenerative disc disease and spondylosis at what I
believe is T11-12.
IMPRESSION: No significant extracardiac findings.

## 2020-05-07 ENCOUNTER — Other Ambulatory Visit: Payer: Self-pay | Admitting: Internal Medicine

## 2020-05-07 DIAGNOSIS — Z6832 Body mass index (BMI) 32.0-32.9, adult: Secondary | ICD-10-CM

## 2020-05-07 DIAGNOSIS — R739 Hyperglycemia, unspecified: Secondary | ICD-10-CM

## 2020-05-23 ENCOUNTER — Encounter: Payer: Self-pay | Admitting: Internal Medicine

## 2020-06-18 ENCOUNTER — Other Ambulatory Visit: Payer: Self-pay | Admitting: Internal Medicine

## 2020-06-25 ENCOUNTER — Other Ambulatory Visit: Payer: Self-pay

## 2020-06-25 ENCOUNTER — Ambulatory Visit (INDEPENDENT_AMBULATORY_CARE_PROVIDER_SITE_OTHER): Payer: Managed Care, Other (non HMO) | Admitting: Internal Medicine

## 2020-06-25 ENCOUNTER — Ambulatory Visit
Admission: RE | Admit: 2020-06-25 | Discharge: 2020-06-25 | Disposition: A | Discharge status: Home / Self Care | Payer: Managed Care, Other (non HMO) | Source: Ambulatory Visit | Attending: Internal Medicine | Admitting: Internal Medicine

## 2020-06-25 ENCOUNTER — Ambulatory Visit
Admission: RE | Admit: 2020-06-25 | Discharge: 2020-06-25 | Disposition: A | Discharge status: Home / Self Care | Payer: Managed Care, Other (non HMO) | Attending: Internal Medicine | Admitting: Internal Medicine

## 2020-06-25 ENCOUNTER — Encounter: Payer: Self-pay | Admitting: Internal Medicine

## 2020-06-25 VITALS — BP 100/62 | HR 78 | Temp 96.5°F | Ht 64.02 in | Wt 147.0 lb

## 2020-06-25 DIAGNOSIS — K219 Gastro-esophageal reflux disease without esophagitis: Secondary | ICD-10-CM | POA: Diagnosis not present

## 2020-06-25 DIAGNOSIS — M5441 Lumbago with sciatica, right side: Secondary | ICD-10-CM

## 2020-06-25 DIAGNOSIS — H02846 Edema of left eye, unspecified eyelid: Secondary | ICD-10-CM

## 2020-06-25 DIAGNOSIS — R634 Abnormal weight loss: Secondary | ICD-10-CM

## 2020-06-25 MED ORDER — TIZANIDINE HCL 2 MG PO TABS: 2.0000 mg | ORAL_TABLET | Freq: Every evening | ORAL | 0 refills | Status: DC | PRN
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
## 2020-06-29 ENCOUNTER — Telehealth: Payer: Self-pay

## 2020-06-29 NOTE — Telephone Encounter (Signed)
LMTCB to see if patient would like ortho referral.

## 2020-06-30 ENCOUNTER — Other Ambulatory Visit: Payer: Self-pay | Admitting: Internal Medicine

## 2020-06-30 DIAGNOSIS — M5441 Lumbago with sciatica, right side: Secondary | ICD-10-CM

## 2020-06-30 NOTE — Progress Notes (Signed)
Order placed for ortho referral.   

## 2020-07-01 ENCOUNTER — Encounter: Payer: Self-pay | Admitting: Internal Medicine

## 2020-07-01 DIAGNOSIS — H02849 Edema of unspecified eye, unspecified eyelid: Secondary | ICD-10-CM | POA: Insufficient documentation

## 2020-07-01 NOTE — Assessment & Plan Note (Signed)
Persistent.  Prescription for tizanidine.  Tylenol.  Check x-ray.  Further work-up pending results.

## 2020-07-01 NOTE — Assessment & Plan Note (Signed)
No upper symptoms reported.  On omeprazole.  

## 2020-07-01 NOTE — Assessment & Plan Note (Signed)
No other swelling noticed.  Is better.  Warm compresses.  Follow.

## 2020-07-01 NOTE — Assessment & Plan Note (Signed)
Doing well on Ozempic.  Continue diet and exercise.  Follow weight.

## 2020-07-27 ENCOUNTER — Other Ambulatory Visit: Payer: Self-pay | Admitting: Internal Medicine

## 2020-07-27 DIAGNOSIS — R739 Hyperglycemia, unspecified: Secondary | ICD-10-CM

## 2020-07-27 DIAGNOSIS — Z6832 Body mass index (BMI) 32.0-32.9, adult: Secondary | ICD-10-CM

## 2020-07-31 ENCOUNTER — Other Ambulatory Visit: Payer: Managed Care, Other (non HMO)

## 2020-07-31 ENCOUNTER — Other Ambulatory Visit: Payer: Self-pay

## 2020-07-31 DIAGNOSIS — R739 Hyperglycemia, unspecified: Secondary | ICD-10-CM

## 2020-07-31 DIAGNOSIS — E785 Hyperlipidemia, unspecified: Secondary | ICD-10-CM

## 2020-08-01 LAB — HEPATIC FUNCTION PANEL
ALT: 13 IU/L (ref 0–32)
AST: 14 IU/L (ref 0–40)
Albumin: 4.5 g/dL (ref 3.8–4.8)
Alkaline Phosphatase: 43 IU/L — ABNORMAL LOW (ref 44–121)
Bilirubin Total: 0.5 mg/dL (ref 0.0–1.2)
Bilirubin, Direct: 0.13 mg/dL (ref 0.00–0.40)
Total Protein: 7.1 g/dL (ref 6.0–8.5)

## 2020-08-01 LAB — HGB A1C W/O EAG: Hgb A1c MFr Bld: 5.5 % (ref 4.8–5.6)

## 2020-08-01 LAB — LIPID PANEL
Chol/HDL Ratio: 2.9 ratio (ref 0.0–4.4)
Cholesterol, Total: 165 mg/dL (ref 100–199)
HDL: 57 mg/dL (ref >39)
LDL Chol Calc (NIH): 85 mg/dL (ref 0–99)
Triglycerides: 130 mg/dL (ref 0–149)
VLDL Cholesterol Cal: 23 mg/dL (ref 5–40)

## 2020-08-01 LAB — BASIC METABOLIC PANEL
BUN/Creatinine Ratio: 27 (ref 12–28)
BUN: 24 mg/dL (ref 8–27)
CO2: 24 mmol/L (ref 20–29)
Calcium: 9.8 mg/dL (ref 8.7–10.3)
Chloride: 102 mmol/L (ref 96–106)
Creatinine, Ser: 0.9 mg/dL (ref 0.57–1.00)
Glucose: 82 mg/dL (ref 65–99)
Potassium: 4 mmol/L (ref 3.5–5.2)
Sodium: 144 mmol/L (ref 134–144)
eGFR: 69 mL/min/{1.73_m2} (ref >59)

## 2020-08-01 LAB — TSH: TSH: 5.74 u[IU]/mL — ABNORMAL HIGH (ref 0.450–4.500)

## 2020-08-08 ENCOUNTER — Ambulatory Visit: Payer: Managed Care, Other (non HMO) | Admitting: Internal Medicine

## 2020-08-09 ENCOUNTER — Ambulatory Visit: Payer: Managed Care, Other (non HMO) | Admitting: Internal Medicine

## 2020-08-10 ENCOUNTER — Other Ambulatory Visit: Payer: Self-pay

## 2020-08-10 ENCOUNTER — Encounter: Payer: Self-pay | Admitting: Internal Medicine

## 2020-08-10 ENCOUNTER — Ambulatory Visit (INDEPENDENT_AMBULATORY_CARE_PROVIDER_SITE_OTHER): Payer: Managed Care, Other (non HMO) | Admitting: Internal Medicine

## 2020-08-10 VITALS — BP 116/72 | HR 77 | Temp 96.7°F | Ht 64.02 in | Wt 142.6 lb

## 2020-08-10 DIAGNOSIS — R739 Hyperglycemia, unspecified: Secondary | ICD-10-CM

## 2020-08-10 DIAGNOSIS — D869 Sarcoidosis, unspecified: Secondary | ICD-10-CM

## 2020-08-10 DIAGNOSIS — R634 Abnormal weight loss: Secondary | ICD-10-CM

## 2020-08-10 DIAGNOSIS — R41840 Attention and concentration deficit: Secondary | ICD-10-CM

## 2020-08-10 DIAGNOSIS — K219 Gastro-esophageal reflux disease without esophagitis: Secondary | ICD-10-CM

## 2020-08-10 DIAGNOSIS — R7989 Other specified abnormal findings of blood chemistry: Secondary | ICD-10-CM | POA: Diagnosis not present

## 2020-08-10 DIAGNOSIS — F439 Reaction to severe stress, unspecified: Secondary | ICD-10-CM

## 2020-08-10 DIAGNOSIS — R9389 Abnormal findings on diagnostic imaging of other specified body structures: Secondary | ICD-10-CM

## 2020-08-10 DIAGNOSIS — Z86010 Personal history of colonic polyps: Secondary | ICD-10-CM

## 2020-08-10 DIAGNOSIS — E7800 Pure hypercholesterolemia, unspecified: Secondary | ICD-10-CM

## 2020-08-10 DIAGNOSIS — R945 Abnormal results of liver function studies: Secondary | ICD-10-CM

## 2020-08-10 MED ORDER — RABEPRAZOLE SODIUM 20 MG PO TBEC: 20.0000 mg | DELAYED_RELEASE_TABLET | Freq: Two times a day (BID) | ORAL | 2 refills | Status: DC
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
## 2020-08-10 NOTE — Patient Instructions (Signed)
Benefiber or citrucel - fiber supplement

## 2020-08-12 ENCOUNTER — Encounter: Payer: Self-pay | Admitting: Internal Medicine

## 2020-08-12 DIAGNOSIS — R41840 Attention and concentration deficit: Secondary | ICD-10-CM | POA: Insufficient documentation

## 2020-08-12 DIAGNOSIS — R7989 Other specified abnormal findings of blood chemistry: Secondary | ICD-10-CM | POA: Insufficient documentation

## 2020-08-12 DIAGNOSIS — R9389 Abnormal findings on diagnostic imaging of other specified body structures: Secondary | ICD-10-CM | POA: Insufficient documentation

## 2020-08-12 NOTE — Assessment & Plan Note (Signed)
Continue crestor.  Low cholesterol diet and exercise. Follow lipid panel and liver function tests.   

## 2020-08-12 NOTE — Assessment & Plan Note (Signed)
Doing well on ozempic.  Eating.  Follow.

## 2020-08-12 NOTE — Assessment & Plan Note (Signed)
Liver panel wnl on recheck check.

## 2020-08-12 NOTE — Assessment & Plan Note (Signed)
Followed by pulmonary.  Bronchoscopy as outlined.  Breathing stable.

## 2020-08-12 NOTE — Assessment & Plan Note (Signed)
TSH slightly elevated on recent check.  Recheck tsh in 6-8 weeks.

## 2020-08-12 NOTE — Assessment & Plan Note (Addendum)
Low carb diet and exercise.  Follow met b and a1c.  Has lost weight.   °

## 2020-08-12 NOTE — Assessment & Plan Note (Signed)
Reports trouble focusing and decreased concentration.  States has been an issue for years for her.  Request referral for ADHD testing.

## 2020-08-12 NOTE — Assessment & Plan Note (Signed)
Previously diagnosed and treated for pneumonia. Right lower lobe mucus plugging - in context of sarcoidosis - s/p bronchoscopy  -hx of sarcoidosis -no atypical findings on bronchoscopy, no cobblesstoning or lymphadenoppathy orexophytic lesions  Recommended continued f/u.  Breathing stable.

## 2020-08-12 NOTE — Assessment & Plan Note (Signed)
Just had colonoscopy - Dr Haig Prophet.  Small TA.  Recommended f/u in 7 years.

## 2020-08-12 NOTE — Assessment & Plan Note (Signed)
Acid reflux as outlined.  On omeprazole bid.  Change to aciphex.  Avoid foods that aggravate.  Avoid eating late.  Call with update.

## 2020-08-12 NOTE — Addendum Note (Signed)
Addended by: Alisa Graff on: 08/12/2020 08:06 AM   Modules accepted: Orders

## 2020-08-12 NOTE — Assessment & Plan Note (Signed)
Continues on zoloft.  Stable.  

## 2020-08-13 ENCOUNTER — Other Ambulatory Visit: Payer: Self-pay | Admitting: Internal Medicine

## 2020-08-13 DIAGNOSIS — R41840 Attention and concentration deficit: Secondary | ICD-10-CM

## 2020-08-13 NOTE — Progress Notes (Signed)
Order placed for neurology referral  per request from Dr Altabet's office

## 2020-08-18 ENCOUNTER — Other Ambulatory Visit: Payer: Self-pay | Admitting: Internal Medicine

## 2020-08-18 DIAGNOSIS — R41840 Attention and concentration deficit: Secondary | ICD-10-CM

## 2020-08-18 NOTE — Progress Notes (Signed)
Order placed for referral for ADHD testing.

## 2020-08-24 ENCOUNTER — Encounter: Payer: Self-pay | Admitting: Internal Medicine

## 2020-08-24 DIAGNOSIS — K219 Gastro-esophageal reflux disease without esophagitis: Secondary | ICD-10-CM

## 2020-08-28 NOTE — Telephone Encounter (Signed)
Order placed for [facility] referral.

## 2020-09-14 ENCOUNTER — Other Ambulatory Visit: Payer: Managed Care, Other (non HMO)

## 2020-09-15 ENCOUNTER — Other Ambulatory Visit: Payer: Self-pay | Admitting: Internal Medicine

## 2020-09-18 ENCOUNTER — Other Ambulatory Visit: Payer: Self-pay | Admitting: Internal Medicine

## 2020-09-21 ENCOUNTER — Other Ambulatory Visit: Payer: Self-pay

## 2020-09-21 ENCOUNTER — Other Ambulatory Visit: Payer: Managed Care, Other (non HMO)

## 2020-09-21 ENCOUNTER — Other Ambulatory Visit (INDEPENDENT_AMBULATORY_CARE_PROVIDER_SITE_OTHER): Payer: Managed Care, Other (non HMO)

## 2020-09-21 DIAGNOSIS — R7989 Other specified abnormal findings of blood chemistry: Secondary | ICD-10-CM | POA: Diagnosis not present

## 2020-09-21 LAB — TSH: TSH: 2.22 u[IU]/mL (ref 0.35–5.50)

## 2020-09-21 NOTE — Addendum Note (Signed)
Addended by: Neta Ehlers on: 09/21/2020 09:26 AM   Modules accepted: Orders

## 2020-10-04 ENCOUNTER — Encounter: Payer: Self-pay | Admitting: Internal Medicine

## 2020-10-11 ENCOUNTER — Other Ambulatory Visit: Payer: Self-pay | Admitting: Internal Medicine

## 2020-10-26 ENCOUNTER — Other Ambulatory Visit: Payer: Self-pay | Admitting: Internal Medicine

## 2020-10-26 DIAGNOSIS — R739 Hyperglycemia, unspecified: Secondary | ICD-10-CM

## 2020-10-26 DIAGNOSIS — Z6832 Body mass index (BMI) 32.0-32.9, adult: Secondary | ICD-10-CM

## 2020-11-02 ENCOUNTER — Other Ambulatory Visit: Payer: Self-pay | Admitting: Internal Medicine

## 2020-11-02 DIAGNOSIS — Z1231 Encounter for screening mammogram for malignant neoplasm of breast: Secondary | ICD-10-CM

## 2020-12-04 ENCOUNTER — Other Ambulatory Visit: Payer: Self-pay

## 2020-12-04 ENCOUNTER — Ambulatory Visit
Admission: RE | Admit: 2020-12-04 | Discharge: 2020-12-04 | Disposition: A | Discharge status: Home / Self Care | Payer: Managed Care, Other (non HMO) | Source: Ambulatory Visit | Attending: Internal Medicine | Admitting: Internal Medicine

## 2020-12-04 DIAGNOSIS — Z1231 Encounter for screening mammogram for malignant neoplasm of breast: Secondary | ICD-10-CM | POA: Diagnosis present

## 2020-12-07 ENCOUNTER — Other Ambulatory Visit: Payer: Self-pay | Admitting: Internal Medicine

## 2020-12-07 DIAGNOSIS — E7800 Pure hypercholesterolemia, unspecified: Secondary | ICD-10-CM

## 2020-12-14 ENCOUNTER — Other Ambulatory Visit: Payer: Self-pay

## 2020-12-14 ENCOUNTER — Encounter: Payer: Self-pay | Admitting: Internal Medicine

## 2020-12-14 ENCOUNTER — Ambulatory Visit (INDEPENDENT_AMBULATORY_CARE_PROVIDER_SITE_OTHER): Payer: Managed Care, Other (non HMO) | Admitting: Internal Medicine

## 2020-12-14 VITALS — BP 102/60 | HR 88 | Temp 97.6°F | Resp 16 | Ht 64.0 in | Wt 137.4 lb

## 2020-12-14 DIAGNOSIS — K219 Gastro-esophageal reflux disease without esophagitis: Secondary | ICD-10-CM | POA: Diagnosis not present

## 2020-12-14 DIAGNOSIS — E7800 Pure hypercholesterolemia, unspecified: Secondary | ICD-10-CM

## 2020-12-14 DIAGNOSIS — F339 Major depressive disorder, recurrent, unspecified: Secondary | ICD-10-CM

## 2020-12-14 DIAGNOSIS — N83202 Unspecified ovarian cyst, left side: Secondary | ICD-10-CM

## 2020-12-14 DIAGNOSIS — R7989 Other specified abnormal findings of blood chemistry: Secondary | ICD-10-CM | POA: Diagnosis not present

## 2020-12-14 DIAGNOSIS — D869 Sarcoidosis, unspecified: Secondary | ICD-10-CM

## 2020-12-14 DIAGNOSIS — Z860100 Personal history of colon polyps, unspecified: Secondary | ICD-10-CM

## 2020-12-14 DIAGNOSIS — R739 Hyperglycemia, unspecified: Secondary | ICD-10-CM | POA: Diagnosis not present

## 2020-12-14 MED ORDER — ESCITALOPRAM OXALATE 10 MG PO TABS: 10.0000 mg | ORAL_TABLET | Freq: Every day | ORAL | 2 refills | Status: DC
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
## 2020-12-15 ENCOUNTER — Telehealth: Payer: Self-pay | Admitting: Internal Medicine

## 2020-12-15 ENCOUNTER — Encounter: Payer: Self-pay | Admitting: Internal Medicine

## 2020-12-15 NOTE — Assessment & Plan Note (Signed)
Has adjusted diet.  Lost weight.  Follow liver function tests.

## 2020-12-15 NOTE — Assessment & Plan Note (Signed)
Low carb diet and exercise.  Follow met b and a1c.  Has lost weight.   °

## 2020-12-15 NOTE — Assessment & Plan Note (Signed)
Continue crestor.  Low cholesterol diet and exercise. Follow lipid panel and liver function tests.   

## 2020-12-15 NOTE — Assessment & Plan Note (Signed)
On aciphex bid now.  Persistent symptoms as outlined.  Saw GI recently.  Did not feel EGD warranted.  She initially felt symptoms were better.  Reports persistent symptoms.  Discussed ozempic and possible side effects.  Discussed holding or at least decreasing dose.  She does not feel is related to ozempic.  Is eating.  No vomiting.  Will contact GI regarding persistent symptoms despite medication to see if any further w/up warranted.  Follow.

## 2020-12-15 NOTE — Assessment & Plan Note (Signed)
Recently evaluated by behavioral health - for evaluation of ADHD. Did not feel ADHD.  Concern over depression.  On zoloft 25mg  q day now.  Will taper off and start lexapro 10mg  q day.  Follow.

## 2020-12-15 NOTE — Assessment & Plan Note (Signed)
Colonoscopy 07/20/20 - polyp (hepatic flexure) - tubular adenoma.  Recommended f/u colonoscopy in 7 years.   

## 2020-12-15 NOTE — Assessment & Plan Note (Signed)
Reviewed gyn note.  - ultrasound - bilateral ovaries appeared normal (Dr Leafy Ro - 09/2018).

## 2020-12-15 NOTE — Telephone Encounter (Signed)
Labs have been ordered to be drawn in our lab.  She needs these drawn through her work.  Will need order for labs mailed to her to be drawn in 6 weeks.

## 2020-12-15 NOTE — Assessment & Plan Note (Signed)
Followed by pulmonary.  Bronchoscopy as outlined.  Breathing stable.

## 2020-12-21 ENCOUNTER — Telehealth: Payer: Self-pay | Admitting: Internal Medicine

## 2020-12-21 NOTE — Telephone Encounter (Signed)
Labs printed and mailed.

## 2020-12-21 NOTE — Telephone Encounter (Signed)
I had discussed with Christy Escobar at her last appt her continued GI issues.  We discussed scheduling a f/u GI appt.  Please call and notify her that GI has scheduled her to f/u on 01/08/21 at 8:30.  (With [place]).

## 2020-12-21 NOTE — Telephone Encounter (Signed)
Left detailed message patient

## 2020-12-25 ENCOUNTER — Telehealth: Payer: Self-pay | Admitting: Pharmacist

## 2020-12-25 DIAGNOSIS — E663 Overweight: Secondary | ICD-10-CM

## 2020-12-25 NOTE — Telephone Encounter (Signed)
Received PA for Ozempic 1 mg via Cover My Meds. Completed. Noted on the PA that Ozempic is not preferred on her plan, but that Trulicity, Bydureon, Byetta, and metformin are. Requested diagnosis. Unsure if PA will be approved given lack of diagnosis for T2DM. Will follow

## 2021-01-02 ENCOUNTER — Other Ambulatory Visit: Payer: Self-pay | Admitting: Internal Medicine

## 2021-01-02 DIAGNOSIS — Z6832 Body mass index (BMI) 32.0-32.9, adult: Secondary | ICD-10-CM

## 2021-01-02 DIAGNOSIS — R739 Hyperglycemia, unspecified: Secondary | ICD-10-CM

## 2021-01-02 MED ORDER — WEGOVY 2.4 MG/0.75ML ~~LOC~~ SOAJ: 2.4000 mg | SUBCUTANEOUS | 5 refills | Status: DC

## 2021-01-02 NOTE — Telephone Encounter (Addendum)
Received notice that PA for Ozempic was denied as patient does not have a diagnosis of T2DM.   Will attempt PA for [facility] to see if it is covered at this time.   Per Cover My Meds, PA not needed. Sent script for Wegovy 2.4 mg weekly, as that is the only available dose at this time. Per pharmacy, prescription is $1600 - clearly her insurance does not cover full cost of medication.   Called patient. Reviewed the above. We have no other options for access at this time. Advised patient to complete currently supply and discontinue Ozempic.

## 2021-01-02 NOTE — Addendum Note (Signed)
Addended by: De Hollingshead on: 01/02/2021 04:32 PM   Modules accepted: Orders

## 2021-01-30 ENCOUNTER — Other Ambulatory Visit: Payer: Self-pay | Admitting: Internal Medicine

## 2021-02-15 ENCOUNTER — Other Ambulatory Visit: Payer: Self-pay

## 2021-02-15 ENCOUNTER — Ambulatory Visit: Payer: Managed Care, Other (non HMO) | Admitting: Internal Medicine

## 2021-02-15 VITALS — BP 110/60 | HR 66 | Temp 97.6°F | Resp 16 | Ht 63.0 in | Wt 138.4 lb

## 2021-02-15 DIAGNOSIS — R739 Hyperglycemia, unspecified: Secondary | ICD-10-CM | POA: Diagnosis not present

## 2021-02-15 DIAGNOSIS — R0981 Nasal congestion: Secondary | ICD-10-CM

## 2021-02-15 DIAGNOSIS — R059 Cough, unspecified: Secondary | ICD-10-CM

## 2021-02-15 DIAGNOSIS — E7800 Pure hypercholesterolemia, unspecified: Secondary | ICD-10-CM

## 2021-02-15 DIAGNOSIS — K219 Gastro-esophageal reflux disease without esophagitis: Secondary | ICD-10-CM | POA: Diagnosis not present

## 2021-02-15 MED ORDER — CEFDINIR 300 MG PO CAPS: 300.0000 mg | ORAL_CAPSULE | Freq: Two times a day (BID) | ORAL | 0 refills | Status: DC
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
## 2021-02-15 NOTE — Patient Instructions (Signed)
Saline nasal spray - flush nose at least 2-3x/day  Continue flonase and mucinex.   Take a probiotic daily while you are on antibiotics and for two weeks after completing antibiotics.    Examples of probiotics:  culturelle, align or florastor.

## 2021-02-16 ENCOUNTER — Encounter: Payer: Self-pay | Admitting: Internal Medicine

## 2021-02-16 NOTE — Assessment & Plan Note (Signed)
Low carb diet and exercise.  Follow met b and a1c.  Has lost weight.

## 2021-02-16 NOTE — Assessment & Plan Note (Signed)
Symptoms as outlined - started 02/13/21 - appear to be c/w sinus infection/URI.  Will obtain nasal swab to rule out RSV, covid and flu.  Will treat with saline nasal spray, steroid nasal spray.  mucinex as directed.  Omnicef.  Probiotics as directed.  Follow.  Call with update.  Quarantine until results are back.

## 2021-02-16 NOTE — Assessment & Plan Note (Signed)
Continue crestor.  Low cholesterol diet and exercise. Follow lipid panel and liver function tests.   

## 2021-02-16 NOTE — Assessment & Plan Note (Signed)
On aciphex bid now.  Persistent symptoms.  Planing for EGD - 03/14/21.

## 2021-02-17 LAB — COVID-19, FLU A+B AND RSV
Influenza A, NAA: NOT DETECTED
Influenza B, NAA: NOT DETECTED
RSV, NAA: NOT DETECTED
SARS-CoV-2, NAA: NOT DETECTED

## 2021-03-15 ENCOUNTER — Ambulatory Visit: Admit: 2021-03-15 | Payer: Managed Care, Other (non HMO)

## 2021-03-15 SURGERY — ESOPHAGOGASTRODUODENOSCOPY (EGD) WITH PROPOFOL
Anesthesia: General

## 2021-03-18 ENCOUNTER — Ambulatory Visit: Payer: Managed Care, Other (non HMO) | Admitting: Internal Medicine

## 2021-03-19 ENCOUNTER — Other Ambulatory Visit: Payer: Self-pay | Admitting: Internal Medicine

## 2021-04-04 ENCOUNTER — Other Ambulatory Visit: Payer: Self-pay | Admitting: Internal Medicine

## 2021-04-04 ENCOUNTER — Encounter: Payer: Self-pay | Admitting: Internal Medicine

## 2021-04-09 NOTE — Telephone Encounter (Signed)
Pt scheduled  

## 2021-04-11 ENCOUNTER — Telehealth (INDEPENDENT_AMBULATORY_CARE_PROVIDER_SITE_OTHER): Payer: Managed Care, Other (non HMO) | Admitting: Internal Medicine

## 2021-04-11 ENCOUNTER — Encounter: Payer: Self-pay | Admitting: Internal Medicine

## 2021-04-11 DIAGNOSIS — K219 Gastro-esophageal reflux disease without esophagitis: Secondary | ICD-10-CM

## 2021-04-11 DIAGNOSIS — R739 Hyperglycemia, unspecified: Secondary | ICD-10-CM | POA: Diagnosis not present

## 2021-04-11 DIAGNOSIS — E7800 Pure hypercholesterolemia, unspecified: Secondary | ICD-10-CM | POA: Diagnosis not present

## 2021-04-11 DIAGNOSIS — R634 Abnormal weight loss: Secondary | ICD-10-CM | POA: Diagnosis not present

## 2021-04-11 MED ORDER — WEGOVY 0.25 MG/0.5ML ~~LOC~~ SOAJ: 0.2500 mg | SUBCUTANEOUS | 1 refills | Status: DC

## 2021-04-11 NOTE — Assessment & Plan Note (Signed)
No acid reflux.  On axiphex. EGD - 03/14/21 - ok per note.   
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
## 2021-04-11 NOTE — Assessment & Plan Note (Signed)
Did well on ozempic.  Insurance not covering.  Trial of wegovy as discussed.  Discussed diet and exercise.  ?

## 2021-04-11 NOTE — Assessment & Plan Note (Signed)
Continue crestor.  Low cholesterol diet and exercise. Follow lipid panel and liver function tests.   

## 2021-04-11 NOTE — Assessment & Plan Note (Signed)
Continue low carb diet and exercise. Discussed weight loss.  Trial of wegovy.  Previously tolerated ozempic.  ?

## 2021-04-17 ENCOUNTER — Telehealth: Payer: Self-pay | Admitting: Internal Medicine

## 2021-04-17 NOTE — Telephone Encounter (Signed)
Patient returned your call.

## 2021-04-18 ENCOUNTER — Telehealth: Payer: Managed Care, Other (non HMO) | Admitting: Internal Medicine

## 2021-05-10 ENCOUNTER — Other Ambulatory Visit: Payer: Self-pay | Admitting: Internal Medicine

## 2021-06-13 ENCOUNTER — Other Ambulatory Visit: Payer: Self-pay | Admitting: Internal Medicine

## 2021-06-18 ENCOUNTER — Ambulatory Visit (INDEPENDENT_AMBULATORY_CARE_PROVIDER_SITE_OTHER): Payer: Managed Care, Other (non HMO) | Admitting: Internal Medicine

## 2021-06-18 VITALS — BP 132/70 | HR 70 | Temp 98.2°F | Resp 17 | Ht 63.0 in | Wt 147.4 lb

## 2021-06-18 DIAGNOSIS — K219 Gastro-esophageal reflux disease without esophagitis: Secondary | ICD-10-CM

## 2021-06-18 DIAGNOSIS — E2839 Other primary ovarian failure: Secondary | ICD-10-CM | POA: Diagnosis not present

## 2021-06-18 DIAGNOSIS — R7989 Other specified abnormal findings of blood chemistry: Secondary | ICD-10-CM | POA: Diagnosis not present

## 2021-06-18 DIAGNOSIS — D869 Sarcoidosis, unspecified: Secondary | ICD-10-CM

## 2021-06-18 DIAGNOSIS — F339 Major depressive disorder, recurrent, unspecified: Secondary | ICD-10-CM

## 2021-06-18 DIAGNOSIS — Z0000 Encounter for general adult medical examination without abnormal findings: Secondary | ICD-10-CM

## 2021-06-18 DIAGNOSIS — R739 Hyperglycemia, unspecified: Secondary | ICD-10-CM

## 2021-06-18 DIAGNOSIS — E7800 Pure hypercholesterolemia, unspecified: Secondary | ICD-10-CM

## 2021-06-18 DIAGNOSIS — Z86010 Personal history of colonic polyps: Secondary | ICD-10-CM

## 2021-06-18 NOTE — Assessment & Plan Note (Signed)
Physical today 06/18/21.  Colonoscopy 12/2014 - recommended f/u in 5 years.  Mammogram 12/04/20 - birads I.
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
## 2021-06-26 ENCOUNTER — Encounter: Payer: Self-pay | Admitting: Internal Medicine

## 2021-06-26 NOTE — Assessment & Plan Note (Signed)
Continue low carb diet and exercise.  Had been on ozempic.  Had previously discussed trial of wegovy.  Insurance coverage.  Discussed diet and exercise as outlined.

## 2021-06-26 NOTE — Assessment & Plan Note (Signed)
Followed by pulmonary.  Breathing stable.   

## 2021-06-26 NOTE — Assessment & Plan Note (Signed)
Continue crestor.  Low cholesterol diet and exercise. Follow lipid panel and liver function tests.   

## 2021-06-26 NOTE — Assessment & Plan Note (Signed)
Evaluated by behavioral health - for evaluation of ADHD. Did not feel ADHD.  Concern over depression.  On lexapro 10mg q day.  Stable.  Follow.  

## 2021-06-26 NOTE — Assessment & Plan Note (Signed)
Colonoscopy 07/20/20 - polyp (hepatic flexure) - tubular adenoma.  Recommended f/u colonoscopy in 7 years.   

## 2021-06-26 NOTE — Assessment & Plan Note (Signed)
No acid reflux.  On axiphex. EGD - 03/14/21 - ok per note.   

## 2021-06-26 NOTE — Assessment & Plan Note (Signed)
Discussed diet and exercise. Follow liver function tests.

## 2021-07-16 ENCOUNTER — Other Ambulatory Visit: Payer: Self-pay | Admitting: Internal Medicine

## 2021-08-04 ENCOUNTER — Other Ambulatory Visit: Payer: Self-pay | Admitting: Internal Medicine

## 2021-08-09 ENCOUNTER — Other Ambulatory Visit: Payer: Self-pay | Admitting: Internal Medicine

## 2021-08-09 DIAGNOSIS — E7800 Pure hypercholesterolemia, unspecified: Secondary | ICD-10-CM

## 2021-08-12 ENCOUNTER — Ambulatory Visit
Admission: RE | Admit: 2021-08-12 | Discharge: 2021-08-12 | Disposition: A | Discharge status: Home / Self Care | Payer: Managed Care, Other (non HMO) | Source: Ambulatory Visit | Attending: Internal Medicine | Admitting: Internal Medicine

## 2021-08-12 DIAGNOSIS — E2839 Other primary ovarian failure: Secondary | ICD-10-CM | POA: Insufficient documentation

## 2021-08-27 ENCOUNTER — Other Ambulatory Visit: Payer: Self-pay | Admitting: Internal Medicine

## 2021-09-22 ENCOUNTER — Other Ambulatory Visit: Payer: Self-pay | Admitting: Internal Medicine

## 2021-10-18 ENCOUNTER — Ambulatory Visit: Payer: Managed Care, Other (non HMO) | Admitting: Internal Medicine

## 2021-10-18 ENCOUNTER — Encounter: Payer: Self-pay | Admitting: Internal Medicine

## 2021-10-18 DIAGNOSIS — M8580 Other specified disorders of bone density and structure, unspecified site: Secondary | ICD-10-CM

## 2021-10-18 DIAGNOSIS — E7800 Pure hypercholesterolemia, unspecified: Secondary | ICD-10-CM

## 2021-10-18 DIAGNOSIS — D869 Sarcoidosis, unspecified: Secondary | ICD-10-CM

## 2021-10-18 DIAGNOSIS — R7989 Other specified abnormal findings of blood chemistry: Secondary | ICD-10-CM

## 2021-10-18 DIAGNOSIS — Z86010 Personal history of colonic polyps: Secondary | ICD-10-CM

## 2021-10-18 DIAGNOSIS — K219 Gastro-esophageal reflux disease without esophagitis: Secondary | ICD-10-CM | POA: Diagnosis not present

## 2021-10-18 DIAGNOSIS — R739 Hyperglycemia, unspecified: Secondary | ICD-10-CM | POA: Diagnosis not present

## 2021-10-18 DIAGNOSIS — F339 Major depressive disorder, recurrent, unspecified: Secondary | ICD-10-CM
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
## 2021-10-20 ENCOUNTER — Encounter: Payer: Self-pay | Admitting: Internal Medicine

## 2021-10-20 NOTE — Assessment & Plan Note (Signed)
Has been followed by pulmonary. S/p bronchoscopy (Dr Laural Roes).  Due f/u with pulmonary.  Pt request f/u with Dr Laural Roes.

## 2021-10-20 NOTE — Assessment & Plan Note (Signed)
Continue calcium, vitamin D and weight bearing exercise.

## 2021-10-20 NOTE — Assessment & Plan Note (Signed)
Continue low carb diet and exercise.  Had been on ozempic.  Had previously discussed trial of wegovy.  Insurance coverage is an issue.  Request referral to check on pt assistance. Discussed diet and exercise as outlined.

## 2021-10-20 NOTE — Assessment & Plan Note (Signed)
Continue crestor.  Low cholesterol diet and exercise. Follow lipid panel and liver function tests.   

## 2021-10-20 NOTE — Assessment & Plan Note (Signed)
Have discussed diet and exercise. Have requested copy of her last labs.  Has a few weeks ago through her work and have not received.

## 2021-10-20 NOTE — Assessment & Plan Note (Signed)
Colonoscopy 07/20/20 - polyp (hepatic flexure) - tubular adenoma.  Recommended f/u colonoscopy in 7 years.   

## 2021-10-20 NOTE — Assessment & Plan Note (Signed)
Evaluated by behavioral health - for evaluation of ADHD. Did not feel ADHD.  Concern over depression.  On lexapro 10mg q day.  Stable.  Follow.  

## 2021-10-20 NOTE — Assessment & Plan Note (Signed)
No acid reflux.  On axiphex. EGD - 03/14/21 - ok per note.   

## 2021-10-23 ENCOUNTER — Telehealth: Payer: Self-pay | Admitting: Internal Medicine

## 2021-10-23 DIAGNOSIS — R739 Hyperglycemia, unspecified: Secondary | ICD-10-CM

## 2021-10-23 NOTE — Telephone Encounter (Signed)
Please notify Christy Escobar that she has an appt with Dr Laural Roes [facility]) 11/08/21 at 8:30.  She had requested f/u with him regarding her sarcoid.  Also, let her know that I have placed referral to see about assistance with wegovy.

## 2021-10-24 NOTE — Telephone Encounter (Signed)
Pt called and aware of appointment date/time. Also knows that referral for medication assistance has been placed.

## 2021-10-29 ENCOUNTER — Telehealth: Payer: Self-pay

## 2021-10-29 NOTE — Chronic Care Management (AMB) (Signed)
   Care Guide Note  10/29/2021 Name: [name] MRN: [id number] DOB: 1950-10-21  Referred by: Einar Pheasant, MD Reason for referral : Care Coordination (Outreach to schedule referral with Pharm D )   Christy Escobar is a 71 y.o. year old female who is a primary care patient of Einar Pheasant, MD. [name] was referred to the pharmacist for assistance related to DM.    Successful contact was made with the patient to discuss pharmacy services including being ready for the pharmacist to call at least 5 minutes before the scheduled appointment time, to have medication bottles and any blood sugar or blood pressure readings ready for review. The patient agreed to meet with the pharmacist via with the pharmacist via telephone visit on 11/08/2021.   Noreene Larsson, [place], [place] [address] Direct Dial: [phone] [email or web address]

## 2021-11-04 ENCOUNTER — Other Ambulatory Visit: Payer: Self-pay | Admitting: Internal Medicine

## 2021-11-04 DIAGNOSIS — Z1231 Encounter for screening mammogram for malignant neoplasm of breast: Secondary | ICD-10-CM

## 2021-11-07 ENCOUNTER — Other Ambulatory Visit: Payer: Self-pay | Admitting: Family

## 2021-11-07 DIAGNOSIS — E7800 Pure hypercholesterolemia, unspecified: Secondary | ICD-10-CM

## 2021-11-08 ENCOUNTER — Other Ambulatory Visit: Payer: Managed Care, Other (non HMO) | Admitting: Pharmacist

## 2021-11-08 NOTE — Progress Notes (Signed)
11/08/2021 Name: Christy Escobar MRN: [id number] DOB: 03-May-1950  Chief Complaint  Patient presents with   Medication Management    Christy Escobar is a 71 y.o. year old female who presented for a telephone visit.   They were referred to the pharmacist by their PCP for assistance in managing medication access.    Subjective:  Care Team: Primary Care Provider: Einar Pheasant, MD ; Next Scheduled Visit: 01/24/22  Medication Access/Adherence  Current Pharmacy:  [facility] [phone] [address], [place] [address] Phone: [phone] Fax: [phone]  [place], [place] - [place] [address] Phone: [phone] Fax: [place], [place] [address] [address] Phone: [phone] Fax: [place] [address] Phone: [phone] Fax: [phone]   Patient reports affordability concerns with their medications: Yes  Patient reports access/transportation concerns to their pharmacy: No  Patient reports adherence concerns with their medications:  No    Patient reports she was previously unable to access [facility] due to cost.    Overweight, Complicated by Hypercholesterolemia:  Current medications: none, previously on Ozempic but insurance now requires diagnosis of diabetes to cover  Previously, her insurance did not require a PA for this medication, but did not provide much coverage for the medication - cost was $1600.   $3614.43  Hyperlipidemia/ASCVD Risk Reduction  Current lipid lowering medications: rosuvastatin 10 mg daily   Health Maintenance  Health Maintenance Due  Topic Date Due   Medicare Annual Wellness (AWV)  Never done   COVID-19 Vaccine (3 - Pfizer series) 12/02/2019     Objective: Lab Results   Component Value Date   HGBA1C 5.5 07/31/2020    Lab Results  Component Value Date   CREATININE 0.90 07/31/2020   BUN 24 07/31/2020   NA 144 07/31/2020   K 4.0 07/31/2020   CL 102 07/31/2020   CO2 24 07/31/2020    Lab Results  Component Value Date   CHOL 165 07/31/2020   HDL 57 07/31/2020   LDLCALC 85 07/31/2020   TRIG 130 07/31/2020   CHOLHDL 2.9 07/31/2020    Medications Reviewed Today     Reviewed by Osker Mason, RPH-CPP (Pharmacist) on 11/08/21 at 1047  Med List Status: <None>   Medication Order Taking? Sig Documenting Provider Last Dose Status Informant  escitalopram (LEXAPRO) 10 MG tablet [id number] Yes Take 1 tablet by mouth once daily Einar Pheasant, MD Taking Active   estradiol (ESTRACE) 1 MG tablet [id number] Yes Take 1 tablet by mouth once daily Dutch Quint B, FNP Taking Active   finasteride (PROSCAR) 5 MG tablet [id number] Yes Take 2.5 mg by mouth every evening. [provider] Taking Active Self  fish oil-omega-3 fatty acids 1000 MG capsule [id number] Yes Take 1 g by mouth 3 (three) times daily. [provider] Taking Active Self  fluticasone (FLONASE) 50 MCG/ACT nasal spray [id number] Yes USE TWO SPRAY(S) IN EACH NOSTRIL ONCE DAILY  Patient taking differently: Place 1 spray into both nostrils daily as needed for allergies.   Einar Pheasant, MD Taking Active Self  minoxidil (LONITEN) 2.5 MG tablet [id number] Yes Take 1.25 mg by mouth every morning. TAKES FOR HAIR LOSS [provider] Taking Active Self  RABEprazole (ACIPHEX) 20 MG tablet [id number] Yes TAKE 1 TABLET BY MOUTH  TWICE DAILY BEFORE A MEAL Dutch Quint B, FNP Taking Active   rosuvastatin (CRESTOR) 10 MG tablet [id number] Yes TAKE 1 TABLET BY MOUTH ONCE DAILY IN THE Annice Pih, Abby Potash B, FNP Taking Active   Semaglutide-Weight Management [facility]) 0.25 MG/0.5ML SOAJ [id number] No Inject 0.25 mg into the skin once a week.  Patient not taking: Reported on 11/08/2021   Einar Pheasant, MD Not Taking Active   valACYclovir (VALTREX) 1000 MG tablet [id number] Yes Take 1,000 mg by mouth daily as needed (outbreaks). [provider] Taking Active Self  zolmitriptan (ZOMIG) 5 MG tablet [id number] Yes TAKE 1/2 (ONE-HALF) TABLET BY MOUTH ONCE DAILY AS NEEDED Einar Pheasant, MD Taking Active               Assessment/Plan:   Hyperlipidemia/ASCVD Risk Reduction: - Currently controlled.  - Recommend to continue current regimen at this time   Obesity/Overweight: - Currently unable to achieve goal weight loss of 5-10% through diet and lifestyle modifications alone - Extensive dietary counseling including education on focus on lean proteins, fruits and vegetables, whole grains and increased fiber consumption, adequate hydration - Extensive exercise counseling including eventual goal of 150 minutes of moderate intensity exercise weekly - Attempted PA for [facility]. Medication does not require a PA. [place] to ask to bill prescription from April. Copay is $1616, which is full cost of the medication. There is a savings card on the manufacturer website that will take $500 off per month for patients whose insurance does not provide coverage, however, this is still expensive.  - Discussed with patient. Discussed referral to [facility]. She declines at this time but notes she will think abou tit  Follow Up Plan: follow up with PCP as scheduled  Catie Hedwig Morton, PharmD, [place] [phone]

## 2021-11-14 ENCOUNTER — Other Ambulatory Visit: Payer: Self-pay | Admitting: Pulmonary Disease

## 2021-11-14 DIAGNOSIS — R918 Other nonspecific abnormal finding of lung field: Secondary | ICD-10-CM

## 2021-11-26 ENCOUNTER — Ambulatory Visit
Admission: RE | Admit: 2021-11-26 | Discharge: 2021-11-26 | Disposition: A | Discharge status: Home / Self Care | Payer: Managed Care, Other (non HMO) | Source: Ambulatory Visit | Attending: Pulmonary Disease | Admitting: Pulmonary Disease

## 2021-11-26 DIAGNOSIS — R918 Other nonspecific abnormal finding of lung field: Secondary | ICD-10-CM

## 2021-11-26 MED ORDER — IOPAMIDOL (ISOVUE-300) INJECTION 61%
75.0000 mL | Freq: Once | INTRAVENOUS | Status: AC | PRN
  Administered 2021-11-26: 75 mL via INTRAVENOUS

## 2021-12-03 ENCOUNTER — Other Ambulatory Visit: Payer: Self-pay | Admitting: Family

## 2021-12-09 ENCOUNTER — Other Ambulatory Visit: Payer: Self-pay

## 2021-12-09 ENCOUNTER — Telehealth: Payer: Self-pay | Admitting: Internal Medicine

## 2021-12-09 DIAGNOSIS — E7800 Pure hypercholesterolemia, unspecified: Secondary | ICD-10-CM

## 2021-12-09 MED ORDER — ESTRADIOL 1 MG PO TABS: 1.0000 mg | ORAL_TABLET | Freq: Every day | ORAL | 0 refills | Status: DC

## 2021-12-09 MED ORDER — ROSUVASTATIN CALCIUM 10 MG PO TABS: 10.0000 mg | ORAL_TABLET | Freq: Every evening | ORAL | 0 refills | Status: DC

## 2021-12-09 NOTE — Telephone Encounter (Signed)
Patient has been trying to get her refills refilled since last week, she needs the following refills; rosuvastatin (CRESTOR) 10 MG tablet and estradiol (ESTRACE) 1 MG tablet.

## 2021-12-09 NOTE — Telephone Encounter (Signed)
sent 

## 2021-12-19 ENCOUNTER — Ambulatory Visit: Payer: Managed Care, Other (non HMO) | Admitting: Internal Medicine

## 2021-12-19 ENCOUNTER — Ambulatory Visit: Payer: Managed Care, Other (non HMO)

## 2021-12-19 ENCOUNTER — Encounter: Payer: Self-pay | Admitting: Internal Medicine

## 2021-12-19 VITALS — BP 118/76 | HR 69 | Temp 97.8°F | Resp 15 | Ht 63.0 in | Wt 157.4 lb

## 2021-12-19 DIAGNOSIS — R739 Hyperglycemia, unspecified: Secondary | ICD-10-CM

## 2021-12-19 DIAGNOSIS — M25552 Pain in left hip: Secondary | ICD-10-CM | POA: Diagnosis not present

## 2021-12-19 MED ORDER — ESCITALOPRAM OXALATE 20 MG PO TABS: 20.0000 mg | ORAL_TABLET | Freq: Every day | ORAL | 2 refills | Status: DC
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
## 2021-12-20 ENCOUNTER — Ambulatory Visit
Admission: RE | Admit: 2021-12-20 | Discharge: 2021-12-20 | Disposition: A | Discharge status: Home / Self Care | Payer: Managed Care, Other (non HMO) | Source: Ambulatory Visit | Attending: Internal Medicine | Admitting: Internal Medicine

## 2021-12-20 DIAGNOSIS — Z1231 Encounter for screening mammogram for malignant neoplasm of breast: Secondary | ICD-10-CM | POA: Insufficient documentation

## 2022-01-02 ENCOUNTER — Encounter: Payer: Self-pay | Admitting: Internal Medicine

## 2022-01-02 NOTE — Assessment & Plan Note (Signed)
Continue low carb diet and exercise.  Had been on ozempic.  Insurance coverage is an issue.

## 2022-01-02 NOTE — Assessment & Plan Note (Signed)
Persistent left hip pain.  No known injury.  Given persistence, check xray.  Further w/up pending results.

## 2022-01-17 ENCOUNTER — Telehealth: Payer: Self-pay | Admitting: Internal Medicine

## 2022-01-17 MED ORDER — SCOPOLAMINE 1 MG/3DAYS TD PT72: 1.0000 | MEDICATED_PATCH | TRANSDERMAL | 0 refills | Status: AC

## 2022-01-17 NOTE — Telephone Encounter (Signed)
Rx sent in for scopolamine patches.

## 2022-01-17 NOTE — Telephone Encounter (Signed)
Pt called stating she is going on a cruise in a couple of weeks and want to get a nausea patch sent to [facility]

## 2022-01-24 ENCOUNTER — Encounter: Payer: Self-pay | Admitting: Internal Medicine

## 2022-01-24 ENCOUNTER — Ambulatory Visit: Payer: Managed Care, Other (non HMO) | Admitting: Internal Medicine

## 2022-01-24 VITALS — BP 128/70 | HR 76 | Temp 97.6°F | Resp 16 | Ht 63.0 in | Wt 157.0 lb

## 2022-01-24 DIAGNOSIS — R7989 Other specified abnormal findings of blood chemistry: Secondary | ICD-10-CM

## 2022-01-24 DIAGNOSIS — F339 Major depressive disorder, recurrent, unspecified: Secondary | ICD-10-CM

## 2022-01-24 DIAGNOSIS — K219 Gastro-esophageal reflux disease without esophagitis: Secondary | ICD-10-CM

## 2022-01-24 DIAGNOSIS — Z86010 Personal history of colonic polyps: Secondary | ICD-10-CM | POA: Diagnosis not present

## 2022-01-24 DIAGNOSIS — R739 Hyperglycemia, unspecified: Secondary | ICD-10-CM

## 2022-01-24 DIAGNOSIS — E7800 Pure hypercholesterolemia, unspecified: Secondary | ICD-10-CM

## 2022-01-24 DIAGNOSIS — D869 Sarcoidosis, unspecified: Secondary | ICD-10-CM

## 2022-01-24 MED ORDER — RABEPRAZOLE SODIUM 20 MG PO TBEC
20.0000 mg | DELAYED_RELEASE_TABLET | Freq: Two times a day (BID) | ORAL | 1 refills | Status: DC
Start: 2022-01-24 — End: 2023-02-02
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
## 2022-01-26 ENCOUNTER — Encounter: Payer: Self-pay | Admitting: Internal Medicine

## 2022-01-26 NOTE — Assessment & Plan Note (Signed)
No acid reflux.  On axiphex. EGD - 03/14/21 - ok per note.

## 2022-01-26 NOTE — Assessment & Plan Note (Signed)
Continue crestor.  Low cholesterol diet and exercise. Follow lipid panel and liver function tests.   

## 2022-01-26 NOTE — Assessment & Plan Note (Signed)
Evaluated by behavioral health - for evaluation of ADHD. Did not feel ADHD.  Concern over depression.  On lexapro '10mg'$  q day.  Stable.  Follow.

## 2022-01-26 NOTE — Assessment & Plan Note (Signed)
Has been followed by pulmonary. S/p bronchoscopy (Dr Laural Roes).  Breathing stable.  Continue f/u with pulmonary.

## 2022-01-26 NOTE — Assessment & Plan Note (Signed)
Colonoscopy 07/20/20 - polyp (hepatic flexure) - tubular adenoma.  Recommended f/u colonoscopy in 7 years.

## 2022-01-26 NOTE — Assessment & Plan Note (Signed)
Continue low carb diet and exercise.  Had been on ozempic.  Insurance coverage is an issue.   In reviewing, Catie did look into this and even with savings, the medication is still over 1000 dollars per month - out of pocket.  Diet and exercise.  Follow.  Weight stable from last check.

## 2022-01-26 NOTE — Assessment & Plan Note (Signed)
Have discussed diet and exercise. Follow liver function tests.

## 2022-01-30 ENCOUNTER — Ambulatory Visit: Payer: Managed Care, Other (non HMO) | Admitting: Internal Medicine

## 2022-02-13 ENCOUNTER — Other Ambulatory Visit (HOSPITAL_COMMUNITY): Payer: Self-pay

## 2022-02-14 ENCOUNTER — Other Ambulatory Visit (HOSPITAL_COMMUNITY): Payer: Self-pay

## 2022-02-14 ENCOUNTER — Telehealth: Payer: Self-pay

## 2022-02-14 NOTE — Telephone Encounter (Signed)
Pharmacy Patient Advocate Encounter   Received notification that prior authorization for Rabeprazoel 2m is required/requested.  Per Test Claim: Plan limitations exceeded - max of 1 tab/daily   PA submitted on 02/14/22 to (ins) [id number] Status is pending

## 2022-02-19 ENCOUNTER — Other Ambulatory Visit (HOSPITAL_COMMUNITY): Payer: Self-pay

## 2022-02-19 NOTE — Telephone Encounter (Signed)
Mychart sent to pt.

## 2022-02-19 NOTE — Telephone Encounter (Signed)
Patient Advocate Encounter  Prior Authorization for Rabeprazole 49m tabs has been approved.    PA# [id number] dates: 02/14/22 through 02/18/23   Placed a call to the pharmacy and left a message to notify of the approval.

## 2022-04-13 ENCOUNTER — Other Ambulatory Visit: Payer: Self-pay | Admitting: Internal Medicine

## 2022-04-13 DIAGNOSIS — E7800 Pure hypercholesterolemia, unspecified: Secondary | ICD-10-CM

## 2022-05-11 ENCOUNTER — Other Ambulatory Visit: Payer: Self-pay | Admitting: Internal Medicine

## 2022-05-19 ENCOUNTER — Telehealth: Payer: Self-pay | Admitting: Internal Medicine

## 2022-05-19 DIAGNOSIS — E7800 Pure hypercholesterolemia, unspecified: Secondary | ICD-10-CM

## 2022-05-19 DIAGNOSIS — R739 Hyperglycemia, unspecified: Secondary | ICD-10-CM

## 2022-05-19 NOTE — Telephone Encounter (Signed)
Pt want to come by and pick up an order for labs

## 2022-05-19 NOTE — Telephone Encounter (Signed)
Pt picked up lab orders today 05/19/22.

## 2022-05-19 NOTE — Telephone Encounter (Signed)
Labs ordered and printed. Placed up front for pick up. My chart sent to patient.

## 2022-05-20 LAB — LIPID PANEL
Cholesterol: 160 (ref 0–200)
HDL: 53 (ref 35–70)
LDL Cholesterol: 83
LDl/HDL Ratio: 3
Triglycerides: 138 (ref 40–160)

## 2022-05-20 LAB — BASIC METABOLIC PANEL
BUN: 20 (ref 4–21)
CO2: 24 — AB (ref 13–22)
Chloride: 104 (ref 99–108)
Creatinine: 0.9 (ref 0.5–1.1)
Glucose: 91
Potassium: 4.6 mEq/L (ref 3.5–5.1)
Sodium: 143 (ref 137–147)

## 2022-05-20 LAB — COMPREHENSIVE METABOLIC PANEL
Albumin: 4.2 (ref 3.5–5.0)
Calcium: 9.3 (ref 8.7–10.7)
eGFR: 70

## 2022-05-20 LAB — HEPATIC FUNCTION PANEL
ALT: 10 U/L (ref 7–35)
AST: 15 (ref 13–35)
Alkaline Phosphatase: 50 (ref 25–125)
Bilirubin, Direct: 0.1
Bilirubin, Total: 0.3

## 2022-05-20 LAB — TSH: TSH: 3.07 (ref 0.41–5.90)

## 2022-05-23 ENCOUNTER — Encounter: Payer: Self-pay | Admitting: Internal Medicine

## 2022-05-24 LAB — BASIC METABOLIC PANEL: EGFR: 70

## 2022-05-28 ENCOUNTER — Ambulatory Visit: Payer: Managed Care, Other (non HMO) | Admitting: Internal Medicine

## 2022-05-28 VITALS — BP 112/70 | HR 74 | Temp 98.0°F | Resp 16 | Ht 63.0 in | Wt 166.0 lb

## 2022-05-28 DIAGNOSIS — R7989 Other specified abnormal findings of blood chemistry: Secondary | ICD-10-CM

## 2022-05-28 DIAGNOSIS — M545 Low back pain, unspecified: Secondary | ICD-10-CM

## 2022-05-28 DIAGNOSIS — F339 Major depressive disorder, recurrent, unspecified: Secondary | ICD-10-CM | POA: Diagnosis not present

## 2022-05-28 DIAGNOSIS — D869 Sarcoidosis, unspecified: Secondary | ICD-10-CM

## 2022-05-28 DIAGNOSIS — R350 Frequency of micturition: Secondary | ICD-10-CM | POA: Diagnosis not present

## 2022-05-28 DIAGNOSIS — K219 Gastro-esophageal reflux disease without esophagitis: Secondary | ICD-10-CM

## 2022-05-28 DIAGNOSIS — Z86010 Personal history of colonic polyps: Secondary | ICD-10-CM

## 2022-05-28 DIAGNOSIS — W57XXXA Bitten or stung by nonvenomous insect and other nonvenomous arthropods, initial encounter: Secondary | ICD-10-CM

## 2022-05-28 DIAGNOSIS — R739 Hyperglycemia, unspecified: Secondary | ICD-10-CM

## 2022-05-28 DIAGNOSIS — N951 Menopausal and female climacteric states: Secondary | ICD-10-CM

## 2022-05-28 DIAGNOSIS — E7800 Pure hypercholesterolemia, unspecified: Secondary | ICD-10-CM

## 2022-05-28 LAB — URINALYSIS, ROUTINE W REFLEX MICROSCOPIC
Bilirubin Urine: NEGATIVE
Hgb urine dipstick: NEGATIVE
Ketones, ur: NEGATIVE
Leukocytes,Ua: NEGATIVE
Nitrite: NEGATIVE
Specific Gravity, Urine: >=1.03 — AB (ref 1.000–1.030)
Total Protein, Urine: NEGATIVE
Urine Glucose: NEGATIVE
Urobilinogen, UA: 0.2 (ref 0.0–1.0)
pH: 6 (ref 5.0–8.0)

## 2022-05-28 MED ORDER — METFORMIN HCL ER 500 MG PO TB24: 500.0000 mg | ORAL_TABLET | Freq: Every day | ORAL | 2 refills | Status: DC

## 2022-05-28 NOTE — Telephone Encounter (Signed)
Results not on [facility] or available via cross search. So I did it the old fashion way & simply called [facility] for results. They will be faxed, check e-fax or with front staff today.
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
## 2022-05-28 NOTE — Telephone Encounter (Signed)
Noted. Holding for results.

## 2022-05-29 LAB — URINE CULTURE
MICRO NUMBER:: 14990282
SPECIMEN QUALITY:: ADEQUATE

## 2022-05-30 ENCOUNTER — Telehealth: Payer: Self-pay

## 2022-05-30 NOTE — Telephone Encounter (Signed)
Patient returned call from Sandy Salaam, CMA.  I read Dr. Westley Hummer Scott's message to patient.

## 2022-05-30 NOTE — Telephone Encounter (Signed)
-----   Message from Dale [place], MD sent at 05/30/2022  4:10 AM EDT ----- Notify - urine culture negative for infection.  Bethann Berkshire (or [place]) were trying to get a copy of her lab results from [employer] [employer].  Apparently they were supposed to be faxed.  Have not seen results.  Need result of her labs drawn last week.

## 2022-05-30 NOTE — Telephone Encounter (Signed)
LMTCB in regards to lab results.  

## 2022-05-30 NOTE — Telephone Encounter (Signed)
noted 

## 2022-06-02 ENCOUNTER — Encounter: Payer: Self-pay | Admitting: Internal Medicine

## 2022-06-02 NOTE — Assessment & Plan Note (Signed)
No acid reflux.  On axiphex. EGD - 03/14/21 - ok per note.   ?

## 2022-06-02 NOTE — Assessment & Plan Note (Signed)
Evaluated by behavioral health - for evaluation of ADHD. Did not feel ADHD.  Concern over depression.  On lexapro 10mg q day.  Stable.  Follow.  

## 2022-06-02 NOTE — Assessment & Plan Note (Signed)
Have discussed diet and exercise.  Follow liver function tests.   

## 2022-06-02 NOTE — Assessment & Plan Note (Signed)
Continue crestor.  Low cholesterol diet and exercise.  Follow lipid panel and liver function tests.  

## 2022-06-02 NOTE — Assessment & Plan Note (Signed)
Check urine to confirm no infection.  

## 2022-06-02 NOTE — Assessment & Plan Note (Signed)
Continue low carb diet and exercise.  Had been on ozempic.  Insurance coverage is an issue.   In reviewing, Catie did look into this and even with savings, the medication is still over 1000 dollars per month - out of pocket.  Diet and exercise. Discussed trial of metformin.  Agreeable.  Start with metformin XR 500mg  q day.  Follow met b and A1c.

## 2022-06-02 NOTE — Assessment & Plan Note (Signed)
Colonoscopy 07/20/20 - polyp (hepatic flexure) - tubular adenoma.  Recommended f/u colonoscopy in 7 years.   

## 2022-06-02 NOTE — Assessment & Plan Note (Signed)
Has been followed by pulmonary. S/p bronchoscopy (Dr Aleskarov).  Breathing stable.  Continue f/u with pulmonary.    

## 2022-06-02 NOTE — Assessment & Plan Note (Signed)
S/p tick bite.  Removed.  No fever.  No rash.  No change in symptoms.  Follow.  Evaluation if symptoms change.

## 2022-06-02 NOTE — Assessment & Plan Note (Signed)
On estrogen.  Check FSH with next labs. F/u absorption of medication.

## 2022-06-02 NOTE — Assessment & Plan Note (Signed)
Stretches.  Follow.

## 2022-06-10 NOTE — Telephone Encounter (Signed)
Received labs. Placed in results folder for review.

## 2022-06-10 NOTE — Telephone Encounter (Signed)
Still have not received labs. Called [facility] and requested again. Updated patient to let her know we still have not received.

## 2022-06-10 NOTE — Telephone Encounter (Signed)
Pt aware of results 

## 2022-06-10 NOTE — Telephone Encounter (Signed)
Please call and notify Tyshell that we received her labs today. Cholesterol levels are ok.  Sodium, potassium, thyroid test, kidney function tests and liver function tests are wnl.

## 2022-07-13 ENCOUNTER — Other Ambulatory Visit: Payer: Self-pay | Admitting: Internal Medicine

## 2022-08-05 ENCOUNTER — Ambulatory Visit: Payer: Managed Care, Other (non HMO) | Admitting: Internal Medicine

## 2022-08-05 ENCOUNTER — Encounter: Payer: Self-pay | Admitting: Internal Medicine

## 2022-08-05 VITALS — BP 116/74 | HR 83 | Temp 97.9°F | Resp 16 | Ht 63.0 in | Wt 165.6 lb

## 2022-08-05 DIAGNOSIS — F339 Major depressive disorder, recurrent, unspecified: Secondary | ICD-10-CM | POA: Diagnosis not present

## 2022-08-05 DIAGNOSIS — M25571 Pain in right ankle and joints of right foot: Secondary | ICD-10-CM

## 2022-08-05 DIAGNOSIS — E7800 Pure hypercholesterolemia, unspecified: Secondary | ICD-10-CM
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
## 2022-08-06 ENCOUNTER — Encounter (INDEPENDENT_AMBULATORY_CARE_PROVIDER_SITE_OTHER): Payer: Self-pay

## 2022-08-10 ENCOUNTER — Encounter: Payer: Self-pay | Admitting: Internal Medicine

## 2022-08-10 NOTE — Assessment & Plan Note (Signed)
Persistent pain with previous injury.  Previous xray - avulsion fracture - right fibula.  Has tried laced ankle support.  Given persistent pain, refer to Dr Ether Griffins for further evaluation and treatment.

## 2022-08-10 NOTE — Assessment & Plan Note (Signed)
Continue crestor 

## 2022-08-10 NOTE — Assessment & Plan Note (Signed)
Continue lexapro  ?

## 2022-08-11 ENCOUNTER — Encounter: Payer: Self-pay | Admitting: Internal Medicine

## 2022-08-12 NOTE — Telephone Encounter (Signed)
Christy Escobar pt referral was not sent due to me waiting on ofc notes to be completed. Message was sent to Dr Lorin Picket on 08/08/22 and she responded completed on 08/10/22. I was waiting since 08/05/22.

## 2022-09-03 ENCOUNTER — Encounter: Payer: Self-pay | Admitting: Internal Medicine

## 2022-09-03 ENCOUNTER — Ambulatory Visit (INDEPENDENT_AMBULATORY_CARE_PROVIDER_SITE_OTHER): Payer: Managed Care, Other (non HMO) | Admitting: Internal Medicine

## 2022-09-03 VITALS — BP 118/72 | HR 71 | Temp 97.8°F | Ht 63.0 in | Wt 165.6 lb

## 2022-09-03 DIAGNOSIS — F339 Major depressive disorder, recurrent, unspecified: Secondary | ICD-10-CM

## 2022-09-03 DIAGNOSIS — K219 Gastro-esophageal reflux disease without esophagitis: Secondary | ICD-10-CM

## 2022-09-03 DIAGNOSIS — Z0000 Encounter for general adult medical examination without abnormal findings: Secondary | ICD-10-CM

## 2022-09-03 DIAGNOSIS — R7989 Other specified abnormal findings of blood chemistry: Secondary | ICD-10-CM

## 2022-09-03 DIAGNOSIS — R945 Abnormal results of liver function studies: Secondary | ICD-10-CM

## 2022-09-03 DIAGNOSIS — E7800 Pure hypercholesterolemia, unspecified: Secondary | ICD-10-CM

## 2022-09-03 DIAGNOSIS — M25571 Pain in right ankle and joints of right foot: Secondary | ICD-10-CM

## 2022-09-03 DIAGNOSIS — D869 Sarcoidosis, unspecified: Secondary | ICD-10-CM

## 2022-09-03 DIAGNOSIS — R739 Hyperglycemia, unspecified: Secondary | ICD-10-CM

## 2022-09-03 NOTE — Assessment & Plan Note (Signed)
Continue low carb diet and exercise.  Check met b and A1c.

## 2022-09-03 NOTE — Assessment & Plan Note (Signed)
Physical today 09/03/22.   Mammogram 12/20/21 - birads I. Colonoscopy 07/20/20 - polyp (hepatic flexure) - tubular adenoma. Recommended f/u colonoscopy in 7 years.

## 2022-09-03 NOTE — Assessment & Plan Note (Signed)
Continue crestor 

## 2022-09-03 NOTE — Assessment & Plan Note (Signed)
Continue lexapro  ?

## 2022-09-03 NOTE — Assessment & Plan Note (Signed)
Has been followed by pulmonary. S/p bronchoscopy (Dr Aleskarov).  Breathing stable.  Continue f/u with pulmonary.    

## 2022-09-03 NOTE — Assessment & Plan Note (Signed)
Have discussed diet and exercise. Follow liver function tests.   

## 2022-09-03 NOTE — Assessment & Plan Note (Signed)
Evaluated recently - right ankle pain. Saw podiatry 08/26/22 - s/p injection.  Also was given rx for celebrex.  Foot is better.  Has one more week of celebrex.  Tolerating.

## 2022-09-03 NOTE — Assessment & Plan Note (Signed)
No acid reflux.  On aciphex. EGD - 03/14/21 - ok per note.
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
## 2022-09-09 ENCOUNTER — Other Ambulatory Visit: Payer: Self-pay | Admitting: Internal Medicine

## 2022-10-06 ENCOUNTER — Other Ambulatory Visit: Payer: Self-pay | Admitting: Internal Medicine

## 2022-10-06 DIAGNOSIS — E7800 Pure hypercholesterolemia, unspecified: Secondary | ICD-10-CM

## 2022-11-06 ENCOUNTER — Other Ambulatory Visit: Payer: Self-pay | Admitting: Internal Medicine

## 2022-11-06 DIAGNOSIS — Z1231 Encounter for screening mammogram for malignant neoplasm of breast: Secondary | ICD-10-CM

## 2022-12-09 ENCOUNTER — Other Ambulatory Visit: Payer: Self-pay | Admitting: Internal Medicine

## 2022-12-17 ENCOUNTER — Telehealth: Payer: Self-pay | Admitting: Internal Medicine

## 2022-12-17 DIAGNOSIS — R739 Hyperglycemia, unspecified: Secondary | ICD-10-CM

## 2022-12-17 DIAGNOSIS — E7800 Pure hypercholesterolemia, unspecified: Secondary | ICD-10-CM

## 2022-12-17 NOTE — Telephone Encounter (Signed)
Labs ordered.

## 2022-12-17 NOTE — Telephone Encounter (Signed)
Patient need lab orders.

## 2022-12-23 ENCOUNTER — Other Ambulatory Visit (INDEPENDENT_AMBULATORY_CARE_PROVIDER_SITE_OTHER): Payer: Managed Care, Other (non HMO)

## 2022-12-23 DIAGNOSIS — R739 Hyperglycemia, unspecified: Secondary | ICD-10-CM | POA: Diagnosis not present

## 2022-12-23 DIAGNOSIS — E7800 Pure hypercholesterolemia, unspecified: Secondary | ICD-10-CM | POA: Diagnosis not present

## 2022-12-23 LAB — CBC WITH DIFFERENTIAL/PLATELET
Basophils Absolute: 0 10*3/uL (ref 0.0–0.1)
Basophils Relative: 0.8 % (ref 0.0–3.0)
Eosinophils Absolute: 0.1 10*3/uL (ref 0.0–0.7)
Eosinophils Relative: 1.4 % (ref 0.0–5.0)
HCT: 39.3 % (ref 36.0–46.0)
Hemoglobin: 13.2 g/dL (ref 12.0–15.0)
Lymphocytes Relative: 32.2 % (ref 12.0–46.0)
Lymphs Abs: 1.7 10*3/uL (ref 0.7–4.0)
MCHC: 33.6 g/dL (ref 30.0–36.0)
MCV: 93.1 fL (ref 78.0–100.0)
Monocytes Absolute: 0.5 10*3/uL (ref 0.1–1.0)
Monocytes Relative: 8.5 % (ref 3.0–12.0)
Neutro Abs: 3.1 10*3/uL (ref 1.4–7.7)
Neutrophils Relative %: 57.1 % (ref 43.0–77.0)
Platelets: 211 10*3/uL (ref 150.0–400.0)
RBC: 4.22 Mil/uL (ref 3.87–5.11)
RDW: 12.3 % (ref 11.5–15.5)
WBC: 5.3 10*3/uL (ref 4.0–10.5)

## 2022-12-23 LAB — BASIC METABOLIC PANEL
BUN: 20 mg/dL (ref 6–23)
CO2: 28 meq/L (ref 19–32)
Calcium: 8.8 mg/dL (ref 8.4–10.5)
Chloride: 104 meq/L (ref 96–112)
Creatinine, Ser: 0.76 mg/dL (ref 0.40–1.20)
GFR: 78.14 mL/min (ref >60.00)
Glucose, Bld: 101 mg/dL — ABNORMAL HIGH (ref 70–99)
Potassium: 3.8 meq/L (ref 3.5–5.1)
Sodium: 140 meq/L (ref 135–145)

## 2022-12-23 LAB — HEPATIC FUNCTION PANEL
ALT: 11 U/L (ref 0–35)
AST: 16 U/L (ref 0–37)
Albumin: 4.1 g/dL (ref 3.5–5.2)
Alkaline Phosphatase: 45 U/L (ref 39–117)
Bilirubin, Direct: 0.1 mg/dL (ref 0.0–0.3)
Total Bilirubin: 0.7 mg/dL (ref 0.2–1.2)
Total Protein: 6.7 g/dL (ref 6.0–8.3)

## 2022-12-23 LAB — HEMOGLOBIN A1C: Hgb A1c MFr Bld: 5.6 % (ref 4.6–6.5)

## 2022-12-23 LAB — LIPID PANEL
Cholesterol: 148 mg/dL (ref 0–200)
HDL: 47.4 mg/dL (ref >39.00)
LDL Cholesterol: 73 mg/dL (ref 0–99)
NonHDL: 100.92
Total CHOL/HDL Ratio: 3
Triglycerides: 142 mg/dL (ref 0.0–149.0)
VLDL: 28.4 mg/dL (ref 0.0–40.0)

## 2022-12-24 ENCOUNTER — Ambulatory Visit
Admission: RE | Admit: 2022-12-24 | Discharge: 2022-12-24 | Disposition: A | Discharge status: Home / Self Care | Payer: Managed Care, Other (non HMO) | Source: Ambulatory Visit | Attending: Internal Medicine | Admitting: Internal Medicine

## 2022-12-24 DIAGNOSIS — Z1231 Encounter for screening mammogram for malignant neoplasm of breast: Secondary | ICD-10-CM | POA: Diagnosis present
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
## 2022-12-25 ENCOUNTER — Ambulatory Visit: Payer: Managed Care, Other (non HMO) | Admitting: Internal Medicine

## 2022-12-25 ENCOUNTER — Encounter: Payer: Self-pay | Admitting: Internal Medicine

## 2022-12-25 VITALS — BP 118/70 | HR 72 | Temp 98.0°F | Resp 16 | Ht 63.0 in | Wt 174.0 lb

## 2022-12-25 DIAGNOSIS — R739 Hyperglycemia, unspecified: Secondary | ICD-10-CM | POA: Diagnosis not present

## 2022-12-25 DIAGNOSIS — E7800 Pure hypercholesterolemia, unspecified: Secondary | ICD-10-CM | POA: Diagnosis not present

## 2022-12-25 DIAGNOSIS — Z860100 Personal history of colon polyps, unspecified: Secondary | ICD-10-CM

## 2022-12-25 DIAGNOSIS — K219 Gastro-esophageal reflux disease without esophagitis: Secondary | ICD-10-CM

## 2022-12-25 DIAGNOSIS — D869 Sarcoidosis, unspecified: Secondary | ICD-10-CM | POA: Diagnosis not present

## 2022-12-25 DIAGNOSIS — Z713 Dietary counseling and surveillance: Secondary | ICD-10-CM

## 2022-12-25 DIAGNOSIS — F339 Major depressive disorder, recurrent, unspecified: Secondary | ICD-10-CM

## 2022-12-25 MED ORDER — TIRZEPATIDE-WEIGHT MANAGEMENT 2.5 MG/0.5ML ~~LOC~~ SOLN: 2.5000 mg | SUBCUTANEOUS | 2 refills | Status: DC

## 2022-12-26 ENCOUNTER — Encounter: Payer: Self-pay | Admitting: Internal Medicine

## 2022-12-26 NOTE — Telephone Encounter (Signed)
Ok to schedule.

## 2022-12-28 ENCOUNTER — Encounter: Payer: Self-pay | Admitting: Internal Medicine

## 2022-12-28 DIAGNOSIS — Z713 Dietary counseling and surveillance: Secondary | ICD-10-CM | POA: Insufficient documentation

## 2022-12-28 NOTE — Assessment & Plan Note (Signed)
Continue low carb diet and exercise.  Check met b and A1c.

## 2022-12-28 NOTE — Assessment & Plan Note (Signed)
Continue crestor 

## 2022-12-28 NOTE — Assessment & Plan Note (Signed)
Continue lexapro.  Stable.  

## 2022-12-28 NOTE — Assessment & Plan Note (Signed)
Colonoscopy 07/20/20 - polyp (hepatic flexure) - tubular adenoma.  Recommended f/u colonoscopy in 7 years.   

## 2022-12-28 NOTE — Assessment & Plan Note (Signed)
Discussed diet and exercise.  Discussed GLP 1 agonist. Discussed possible side effects and contraindications of medication.  Rx sent in for zepbound 2.5mg  q week.  Follow.

## 2022-12-28 NOTE — Assessment & Plan Note (Signed)
No acid reflux.  On aciphex. EGD - 03/14/21 - ok per note.

## 2022-12-28 NOTE — Assessment & Plan Note (Signed)
Has been followed by pulmonary. S/p bronchoscopy (Dr Aleskarov).  Breathing stable.  Continue f/u with pulmonary.    

## 2023-01-21 ENCOUNTER — Encounter: Payer: Self-pay | Admitting: Internal Medicine

## 2023-01-22 NOTE — Telephone Encounter (Signed)
Ok to send in to [employer] for Self Pay?

## 2023-01-22 NOTE — Telephone Encounter (Signed)
Ok. See if needs nurse visit to instruct on how to administer.

## 2023-01-28 ENCOUNTER — Other Ambulatory Visit: Payer: Self-pay | Admitting: Internal Medicine

## 2023-02-02 ENCOUNTER — Other Ambulatory Visit: Payer: Self-pay | Admitting: Internal Medicine

## 2023-02-05 ENCOUNTER — Other Ambulatory Visit: Payer: Self-pay

## 2023-02-05 DIAGNOSIS — Z6830 Body mass index (BMI) 30.0-30.9, adult: Secondary | ICD-10-CM

## 2023-02-05 MED ORDER — TIRZEPATIDE-WEIGHT MANAGEMENT 2.5 MG/0.5ML ~~LOC~~ SOLN: 2.5000 mg | SUBCUTANEOUS | 2 refills | Status: DC

## 2023-02-13 ENCOUNTER — Ambulatory Visit: Payer: Managed Care, Other (non HMO)

## 2023-02-13 NOTE — Progress Notes (Signed)
 Patient came in for a teaching on how to draw up the Zepbound   from the vial to administer at home I demonstrated how to draw up in the syringe and . Pt then gave herself the injection in her belly, I explained to pt that she should never give injection near bellybutton. And also to switch the area when she does give it. Always give on the same day each week.explained to her that she should never recap needles as well. Pt verbalized understanding

## 2023-02-19 NOTE — Telephone Encounter (Signed)
Patient says she was given metformin for weight loss but then chose to pay cash price through [facility] for zepbound. She took her first injection last week. Does patient need to continue metformin or ok to stop since she has Zepbound now?

## 2023-02-19 NOTE — Telephone Encounter (Signed)
No, she can stop metformin. Please remove from med list.  Thank you.

## 2023-02-20 ENCOUNTER — Other Ambulatory Visit: Payer: Self-pay

## 2023-03-03 ENCOUNTER — Other Ambulatory Visit: Payer: Self-pay | Admitting: Internal Medicine

## 2023-03-03 DIAGNOSIS — E7800 Pure hypercholesterolemia, unspecified: Secondary | ICD-10-CM

## 2023-04-16 ENCOUNTER — Other Ambulatory Visit: Payer: Self-pay | Admitting: Internal Medicine

## 2023-04-16 ENCOUNTER — Telehealth: Payer: Self-pay

## 2023-04-16 DIAGNOSIS — Z6830 Body mass index (BMI) 30.0-30.9, adult: Secondary | ICD-10-CM

## 2023-04-16 NOTE — Telephone Encounter (Signed)
 Copied from CRM [id number]. Topic: General - Other >> Apr 16, 2023 12:53 PM Rodman Pickle T wrote: Reason for CRM: patient has been feeling unwell and needing for dr scott to call her in something for nausea she has been having headaches as well

## 2023-04-16 NOTE — Telephone Encounter (Signed)
 Left voicemail recommending that patient keeps appt tomorrow morning with Bethanie Dicker @ 8:20am

## 2023-04-17 ENCOUNTER — Ambulatory Visit: Admitting: Nurse Practitioner

## 2023-04-17 ENCOUNTER — Encounter: Payer: Self-pay | Admitting: Nurse Practitioner

## 2023-04-17 VITALS — BP 120/74 | HR 69 | Temp 97.7°F | Ht 63.0 in | Wt 165.0 lb

## 2023-04-17 DIAGNOSIS — J3089 Other allergic rhinitis: Secondary | ICD-10-CM | POA: Diagnosis not present

## 2023-04-17 DIAGNOSIS — G43009 Migraine without aura, not intractable, without status migrainosus: Secondary | ICD-10-CM

## 2023-04-17 DIAGNOSIS — R110 Nausea: Secondary | ICD-10-CM

## 2023-04-17 DIAGNOSIS — F5104 Psychophysiologic insomnia: Secondary | ICD-10-CM

## 2023-04-17 MED ORDER — TRAZODONE HCL 50 MG PO TABS
25.0000 mg | ORAL_TABLET | Freq: Every evening | ORAL | 2 refills | Status: DC | PRN
Start: 2023-04-17 — End: 2023-07-27

## 2023-04-17 MED ORDER — ONDANSETRON 4 MG PO TBDP: 4.0000 mg | ORAL_TABLET | Freq: Three times a day (TID) | ORAL | 0 refills | Status: DC | PRN

## 2023-04-17 NOTE — Progress Notes (Signed)
 Bluford Burkitt, NP-C Phone: [phone]  Christy Escobar is a 73 y.o. female who presents today for headaches.   Discussed the use of AI scribe software for clinical note transcription with the patient, who gave verbal consent to proceed.  History of Present Illness   Christy Escobar is a 73 year old female with migraines who presents with persistent headaches and nausea.  For the past two weeks, she has been experiencing persistent headaches described as terrible and constant, often accompanied by nausea. She has a history of migraines and suspects a connection to her current symptoms. She denies congestion, drainage, sore throat, shortness of breath, or fever, but does feel fatigued. She has not experienced diarrhea, which was a concern with the antibiotics.  She was diagnosed with sinusitis and conjunctivitis at a walk-in clinic on April 12, 2023, and was prescribed eye drops, Augmentin, prednisone, and cough syrup. Despite treatment, she reports only slight improvement and continues to feel 'blah'. She has been taking Zomig as needed for migraines, which provides some relief when she is able to rest. Tylenol offers minimal relief, and she is cautious with ibuprofen due to concerns about stomach issues. She has completed her course of prednisone.  Her sleep has been poor for years, with difficulty falling asleep and staying asleep, often lying awake with racing thoughts. She does not drink alcohol or smoke but consumes sweet tea and soda, with minimal water intake.  She works at a park and attributes some of her symptoms to allergies, as she typically experiences eye infections around this time of year. She uses over-the-counter antihistamines daily and continues with her eye drops and nasal rinses.      Social History   Tobacco Use  Smoking Status Never  Smokeless Tobacco Never    Current Outpatient Medications on File Prior to Visit  Medication Sig Dispense Refill   escitalopram  (LEXAPRO) 20 MG tablet Take 1 tablet by mouth once daily 90 tablet 1   estradiol (ESTRACE) 1 MG tablet Take 1 tablet by mouth once daily 90 tablet 0   finasteride (PROSCAR) 5 MG tablet Take 2.5 mg by mouth every evening.     fish oil-omega-3 fatty acids 1000 MG capsule Take 1 g by mouth 3 (three) times daily.     fluticasone (FLONASE) 50 MCG/ACT nasal spray USE TWO SPRAY(S) IN EACH NOSTRIL ONCE DAILY (Patient taking differently: Place 1 spray into both nostrils daily as needed for allergies.) 16 g 2   minoxidil (LONITEN) 2.5 MG tablet Take 1.25 mg by mouth every morning. TAKES FOR HAIR LOSS     RABEprazole (ACIPHEX) 20 MG tablet TAKE 1 TABLET BY MOUTH TWICE DAILY BEFORE A MEAL 180 tablet 0   rosuvastatin (CRESTOR) 10 MG tablet TAKE 1 TABLET BY MOUTH ONCE DAILY IN THE EVENING 90 tablet 0   scopolamine (TRANSDERM-SCOP) 1 MG/3DAYS Place 1 patch (1.5 mg total) onto the skin every 3 (three) days. 10 patch 0   valACYclovir (VALTREX) 1000 MG tablet Take 1,000 mg by mouth daily as needed (outbreaks).     ZEPBOUND 2.5 MG/0.5ML injection vial INJECT 0.5 ML (2.5 MG) UNDER THE SKIN ONCE WEEKLY (0.5ML= 50 UNITS) 2 mL 2   zolmitriptan (ZOMIG) 5 MG tablet TAKE 1/2 (ONE-HALF) TABLET BY MOUTH ONCE DAILY AS NEEDED 6 tablet 0   No current facility-administered medications on file prior to visit.     ROS see history of present illness  Objective  Physical Exam Vitals:   04/17/23 0819  BP:  120/74  Pulse: 69  Temp: 97.7 F (36.5 C)  SpO2: 95%    BP Readings from Last 3 Encounters:  04/17/23 120/74  12/25/22 118/70  09/03/22 118/72   Wt Readings from Last 3 Encounters:  04/17/23 165 lb (74.8 kg)  12/25/22 174 lb (78.9 kg)  09/03/22 165 lb 9.6 oz (75.1 kg)    Physical Exam Constitutional:      General: She is not in acute distress.    Appearance: Normal appearance.  HENT:     Head: Normocephalic.     Right Ear: Tympanic membrane normal.     Left Ear: Tympanic membrane normal.     Nose: Nose  normal.     Mouth/Throat:     Mouth: Mucous membranes are moist.     Pharynx: Oropharynx is clear.  Eyes:     Conjunctiva/sclera: Conjunctivae normal.     Pupils: Pupils are equal, round, and reactive to light.  Cardiovascular:     Rate and Rhythm: Normal rate and regular rhythm.     Heart sounds: Normal heart sounds.  Pulmonary:     Effort: Pulmonary effort is normal.     Breath sounds: Normal breath sounds.  Lymphadenopathy:     Cervical: No cervical adenopathy.  Skin:    General: Skin is warm and dry.  Neurological:     General: No focal deficit present.     Mental Status: She is alert.  Psychiatric:        Mood and Affect: Mood normal.        Behavior: Behavior normal.     Assessment/Plan: Please see individual problem list.  Environmental and seasonal allergies Assessment & Plan: Sinusitis previously treated with Augmentin and prednisone, she shows no congestion or drainage, suggesting resolution or a non-bacterial cause. Complete the current course of Augmentin and continue nasal saline rinses with warm water. Encourage increased water intake and continue allergy medications, including over-the-counter antihistamines and Flonase.    Migraine without aura and without status migrainosus, not intractable Assessment & Plan: She experiences persistent headaches with nausea, likely due to migraine exacerbation. Sinusitis or allergy-related headaches are considered, with dehydration and lack of sleep as contributing factors. Administer ibuprofen 400-600 mg as needed for headache relief and prescribe Zofran for nausea. Encourage increased water intake and continue allergy medications, including over-the-counter antihistamines and Flonase.   Psychophysiological insomnia Assessment & Plan: Chronic insomnia likely contributes to headaches and fatigue. Prescribe trazodone 50 mg, taking half to one tablet at bedtime. Counseled on sleep hygiene.   Orders: -     traZODone HCl; Take  0.5-1 tablets (25-50 mg total) by mouth at bedtime as needed for sleep.  Dispense: 30 tablet; Refill: 2  Nausea -     Ondansetron; Take 1 tablet (4 mg total) by mouth every 8 (eight) hours as needed for nausea or vomiting.  Dispense: 20 tablet; Refill: 0     Return if symptoms worsen or fail to improve.   Bluford Burkitt, NP-C [facility]

## 2023-04-22 ENCOUNTER — Encounter: Payer: Self-pay | Admitting: Nurse Practitioner

## 2023-04-22 NOTE — Assessment & Plan Note (Addendum)
 Sinusitis previously treated with Augmentin and prednisone, she shows no congestion or drainage, suggesting resolution or a non-bacterial cause. Complete the current course of Augmentin and continue nasal saline rinses with warm water. Encourage increased water intake and continue allergy medications, including over-the-counter antihistamines and Flonase.

## 2023-04-22 NOTE — Assessment & Plan Note (Signed)
 She experiences persistent headaches with nausea, likely due to migraine exacerbation. Sinusitis or allergy-related headaches are considered, with dehydration and lack of sleep as contributing factors. Administer ibuprofen 400-600 mg as needed for headache relief and prescribe Zofran for nausea. Encourage increased water intake and continue allergy medications, including over-the-counter antihistamines and Flonase.

## 2023-04-22 NOTE — Assessment & Plan Note (Addendum)
 Chronic insomnia likely contributes to headaches and fatigue. Prescribe trazodone 50 mg, taking half to one tablet at bedtime. Counseled on sleep hygiene.

## 2023-04-27 ENCOUNTER — Ambulatory Visit: Payer: Self-pay | Admitting: *Deleted

## 2023-04-27 NOTE — Telephone Encounter (Signed)
 Copied from CRM [id number]. Topic: Clinical - Pink Word Triage >> Apr 27, 2023  8:45 AM Crispin Dolphin wrote: Reason for Triage: Patient called. States she has been on antibiotics for 3 weeks and is making her constipated. She has not been able to able to use the bathroom in 2 weeks. She has tried laxatives and fiber drinks and nothing is working. Called to leave a message for provider to see what she suggests. Thank You Reason for Disposition  Last bowel movement (BM) > 4 days ago  Answer Assessment - Initial Assessment Questions 1. STOOL PATTERN OR FREQUENCY: "How often do you have a bowel movement (BM)?"  (Normal range: 3 times a day to every 3 days)  "When was your last BM?"       I'm so constipated.     Sunday I tried an enema.  I had a little stool from that.  I'm using Miralax and laxatives.    I was on antibiotics for a sinus infection and an eye infection I feel bloated and I'm cramping and nothing happens.    I finished the antibiotics a few days ago. 2. STRAINING: "Do you have to strain to have a BM?"      Yes 3. RECTAL PAIN: "Does your rectum hurt when the stool comes out?" If Yes, ask: "Do you have hemorrhoids? How bad is the pain?"  (Scale 1-10; or mild, moderate, severe)     No 4. STOOL COMPOSITION: "Are the stools hard?"      I did an enema and the stool was hard. 5. BLOOD ON STOOLS: "Has there been any blood on the toilet tissue or on the surface of the BM?" If Yes, ask: "When was the last time?"     No 6. CHRONIC CONSTIPATION: "Is this a new problem for you?"  If No, ask: "How long have you had this problem?" (days, weeks, months)      I've always had issues with constipation but this time it's been 2 weeks.   7. CHANGES IN DIET OR HYDRATION: "Have there been any recent changes in your diet?" "How much fluids are you drinking on a daily basis?"  "How much have you had to drink today?"     I've been on antibiotics.  8. MEDICINES: "Have you been taking any new medicines?" "Are you  taking any narcotic pain medicines?" (e.g., Dilaudid, morphine, Percocet, Vicodin)     An/ANtibiotics.   9. LAXATIVES: "Have you been using any stool softeners, laxatives, or enemas?"  If Yes, ask "What, how often, and when was the last time?"     Yes Miralax, enema.   I've taken some laxatives.   10. ACTIVITY:  "How much walking do you do every day?"  "Has your activity level decreased in the past week?"        No change in my activity level except I've been sick for the last 3 weeks and I've mostly been in the house. 11. CAUSE: "What do you think is causing the constipation?"        Maybe the antibiotics, being sick with a sinus infection. 12. OTHER SYMPTOMS: "Do you have any other symptoms?" (e.g., abdomen pain, bloating, fever, vomiting)       Bloating and creamping. 13. MEDICAL HISTORY: "Do you have a history of hemorrhoids, rectal fissures, or rectal surgery or rectal abscess?"         Yes constipation has been an issue with me for a long time. 14. PREGNANCY: "Is there  any chance you are pregnant?" "When was your last menstrual period?"       N/A  Protocols used: Constipation-A-AH  Chief Complaint: constipation Symptoms: No BM for 2 weeks.   Bloated and cramping.   Been on antibiotics for a sinus and eye infection that she completed a few days ago.   Has used Miralax, enema, and laxatives.   Had a small BM after the enema Sunday but it was hard and a very small amount. Frequency: For the last 2 weeks Pertinent Negatives: Patient denies OTC medications/enema helping Disposition: [] ED /[] Urgent Care (no appt availability in office) / [x] Appointment(In office/virtual)/ []  [place] [facility] [place] [facility]/ [] Home Care/ [] Refused Recommended Disposition /[] [place] [facility]/ []  Follow-up with PCP Additional Notes: Appt made with Dr. Sueanne Emerald for 04/29/2023 at 9:40.    (She has fasting labs earlier that morning so wanted to come on the same day for labs and appt for constipation).

## 2023-04-29 ENCOUNTER — Other Ambulatory Visit: Payer: Self-pay

## 2023-04-29 ENCOUNTER — Ambulatory Visit: Admitting: Family Medicine

## 2023-04-29 ENCOUNTER — Other Ambulatory Visit (INDEPENDENT_AMBULATORY_CARE_PROVIDER_SITE_OTHER): Payer: Managed Care, Other (non HMO)

## 2023-04-29 DIAGNOSIS — E7800 Pure hypercholesterolemia, unspecified: Secondary | ICD-10-CM

## 2023-04-29 DIAGNOSIS — R739 Hyperglycemia, unspecified: Secondary | ICD-10-CM | POA: Diagnosis not present

## 2023-04-29 LAB — BASIC METABOLIC PANEL WITH GFR
BUN: 23 mg/dL (ref 6–23)
CO2: 27 meq/L (ref 19–32)
Calcium: 9.4 mg/dL (ref 8.4–10.5)
Chloride: 105 meq/L (ref 96–112)
Creatinine, Ser: 0.76 mg/dL (ref 0.40–1.20)
GFR: 77.95 mL/min (ref >60.00)
Glucose, Bld: 104 mg/dL — ABNORMAL HIGH (ref 70–99)
Potassium: 3.6 meq/L (ref 3.5–5.1)
Sodium: 138 meq/L (ref 135–145)

## 2023-04-29 LAB — LIPID PANEL
Cholesterol: 170 mg/dL (ref 0–200)
HDL: 47.2 mg/dL (ref >39.00)
LDL Cholesterol: 83 mg/dL (ref 0–99)
NonHDL: 122.66
Total CHOL/HDL Ratio: 4
Triglycerides: 200 mg/dL — ABNORMAL HIGH (ref 0.0–149.0)
VLDL: 40 mg/dL (ref 0.0–40.0)

## 2023-04-29 LAB — HEPATIC FUNCTION PANEL
ALT: 16 U/L (ref 0–35)
AST: 19 U/L (ref 0–37)
Albumin: 4.2 g/dL (ref 3.5–5.2)
Alkaline Phosphatase: 37 U/L — ABNORMAL LOW (ref 39–117)
Bilirubin, Direct: 0 mg/dL (ref 0.0–0.3)
Total Bilirubin: 0.6 mg/dL (ref 0.2–1.2)
Total Protein: 6.8 g/dL (ref 6.0–8.3)

## 2023-04-29 LAB — HEMOGLOBIN A1C: Hgb A1c MFr Bld: 5.7 % (ref 4.6–6.5)

## 2023-04-29 LAB — TSH: TSH: 2.5 u[IU]/mL (ref 0.35–5.50)

## 2023-05-04 ENCOUNTER — Other Ambulatory Visit: Payer: Managed Care, Other (non HMO)

## 2023-05-05 ENCOUNTER — Telehealth: Payer: Self-pay

## 2023-05-05 NOTE — Telephone Encounter (Signed)
 Copied from CRM [id number]. Topic: Clinical - Lab/Test Results >> May 01, 2023  4:41 PM Tiffany S wrote: Reason for CRM: Patient was returning your call please follow up

## 2023-05-07 ENCOUNTER — Ambulatory Visit: Payer: Managed Care, Other (non HMO) | Admitting: Internal Medicine

## 2023-05-12 ENCOUNTER — Other Ambulatory Visit: Payer: Self-pay | Admitting: Internal Medicine

## 2023-05-14 ENCOUNTER — Encounter: Payer: Self-pay | Admitting: Internal Medicine

## 2023-05-14 ENCOUNTER — Ambulatory Visit: Payer: Managed Care, Other (non HMO) | Admitting: Internal Medicine

## 2023-05-14 VITALS — BP 118/70 | HR 75 | Temp 98.0°F | Resp 16 | Ht 63.0 in | Wt 166.0 lb

## 2023-05-14 DIAGNOSIS — K219 Gastro-esophageal reflux disease without esophagitis: Secondary | ICD-10-CM

## 2023-05-14 DIAGNOSIS — E7800 Pure hypercholesterolemia, unspecified: Secondary | ICD-10-CM | POA: Diagnosis not present

## 2023-05-14 DIAGNOSIS — Z860100 Personal history of colon polyps, unspecified: Secondary | ICD-10-CM

## 2023-05-14 DIAGNOSIS — R739 Hyperglycemia, unspecified: Secondary | ICD-10-CM | POA: Diagnosis not present

## 2023-05-14 DIAGNOSIS — Z6830 Body mass index (BMI) 30.0-30.9, adult: Secondary | ICD-10-CM | POA: Diagnosis not present

## 2023-05-14 DIAGNOSIS — F339 Major depressive disorder, recurrent, unspecified: Secondary | ICD-10-CM

## 2023-05-14 DIAGNOSIS — Z713 Dietary counseling and surveillance: Secondary | ICD-10-CM

## 2023-05-14 MED ORDER — ZEPBOUND 2.5 MG/0.5ML ~~LOC~~ SOLN: 5.0000 mg | SUBCUTANEOUS | 2 refills | Status: DC

## 2023-05-14 MED ORDER — RABEPRAZOLE SODIUM 20 MG PO TBEC: 20.0000 mg | DELAYED_RELEASE_TABLET | Freq: Two times a day (BID) | ORAL | 0 refills | Status: DC

## 2023-05-14 MED ORDER — ESTRADIOL 1 MG PO TABS: 1.0000 mg | ORAL_TABLET | Freq: Every day | ORAL | 0 refills | Status: DC

## 2023-05-14 MED ORDER — ROSUVASTATIN CALCIUM 10 MG PO TABS: 10.0000 mg | ORAL_TABLET | Freq: Every evening | ORAL | 0 refills | Status: DC

## 2023-05-14 MED ORDER — ESCITALOPRAM OXALATE 20 MG PO TABS: 20.0000 mg | ORAL_TABLET | Freq: Every day | ORAL | 1 refills | Status: DC
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
## 2023-05-14 NOTE — Assessment & Plan Note (Signed)
 Continue lexapro . Taking trazodone . Helping with sleep. Follow. Overall stable.

## 2023-05-14 NOTE — Assessment & Plan Note (Signed)
Colonoscopy 07/20/20 - polyp (hepatic flexure) - tubular adenoma.  Recommended f/u colonoscopy in 7 years.   

## 2023-05-14 NOTE — Assessment & Plan Note (Signed)
 Low carb diet and exercise. Follow met b and A1c.  Lab Results  Component Value Date   HGBA1C 5.7 04/29/2023

## 2023-05-14 NOTE — Assessment & Plan Note (Signed)
 No acid reflux.  On aciphex . EGD - 03/14/21 - ok per note.  No upper symptoms reported.

## 2023-05-14 NOTE — Assessment & Plan Note (Signed)
 Continue crestor.  Low cholesterol diet and exercise.  Follow lipid panel and liver function tests.

## 2023-05-14 NOTE — Assessment & Plan Note (Signed)
 Discussed weight. Discussed zepbound. She wants to increase the dose. Will increase to 5mg  weekly. Discussed constipation. Will start miralax daily. Stay hydrated. Follow closely. Call with update.

## 2023-06-16 ENCOUNTER — Other Ambulatory Visit: Payer: Self-pay | Admitting: Internal Medicine

## 2023-06-16 DIAGNOSIS — Z6830 Body mass index (BMI) 30.0-30.9, adult: Secondary | ICD-10-CM

## 2023-07-26 ENCOUNTER — Other Ambulatory Visit: Payer: Self-pay | Admitting: Nurse Practitioner

## 2023-07-26 DIAGNOSIS — F5104 Psychophysiologic insomnia: Secondary | ICD-10-CM

## 2023-07-27 NOTE — Telephone Encounter (Signed)
 Rx ok'd for trazodone  #30 with 2 refills.

## 2023-08-14 ENCOUNTER — Telehealth: Payer: Self-pay

## 2023-08-14 NOTE — Telephone Encounter (Signed)
 Copied from CRM [id number]. Topic: General - Billing Inquiry >> Aug 14, 2023  2:32 PM [place] J wrote: Reason for CRM: Patient would like to go over a diagnosis code that was associated on her past visit. Patient was only in for a routine check up and Dr. Glendia put a mental health diagnosis. She would like that to be changed.  Patient is aware of the next business day turn around time.

## 2023-08-15 NOTE — Telephone Encounter (Signed)
 Christy Escobar, can you help with this.  This was a regular follow up for her. She is on lexapro  and was previously started on trazodone . The note states - to continue medications and was stable.  I am not sure what I need to do.  I did not have to change anything, but I usually do note - stable and to continue medications taking if stable.

## 2023-09-04 ENCOUNTER — Other Ambulatory Visit (INDEPENDENT_AMBULATORY_CARE_PROVIDER_SITE_OTHER)

## 2023-09-04 DIAGNOSIS — R739 Hyperglycemia, unspecified: Secondary | ICD-10-CM | POA: Diagnosis not present

## 2023-09-04 DIAGNOSIS — E7800 Pure hypercholesterolemia, unspecified: Secondary | ICD-10-CM | POA: Diagnosis not present

## 2023-09-04 LAB — BASIC METABOLIC PANEL WITH GFR
BUN: 20 mg/dL (ref 6–23)
CO2: 29 meq/L (ref 19–32)
Calcium: 8.9 mg/dL (ref 8.4–10.5)
Chloride: 104 meq/L (ref 96–112)
Creatinine, Ser: 0.86 mg/dL (ref 0.40–1.20)
GFR: 67.03 mL/min (ref >60.00)
Glucose, Bld: 96 mg/dL (ref 70–99)
Potassium: 4.1 meq/L (ref 3.5–5.1)
Sodium: 141 meq/L (ref 135–145)

## 2023-09-04 LAB — HEPATIC FUNCTION PANEL
ALT: 17 U/L (ref 0–35)
AST: 20 U/L (ref 0–37)
Albumin: 4.3 g/dL (ref 3.5–5.2)
Alkaline Phosphatase: 46 U/L (ref 39–117)
Bilirubin, Direct: 0.1 mg/dL (ref 0.0–0.3)
Total Bilirubin: 0.5 mg/dL (ref 0.2–1.2)
Total Protein: 7.1 g/dL (ref 6.0–8.3)

## 2023-09-04 LAB — LIPID PANEL
Cholesterol: 132 mg/dL (ref 0–200)
HDL: 42.2 mg/dL (ref >39.00)
LDL Cholesterol: 65 mg/dL (ref 0–99)
NonHDL: 89.6
Total CHOL/HDL Ratio: 3
Triglycerides: 125 mg/dL (ref 0.0–149.0)
VLDL: 25 mg/dL (ref 0.0–40.0)

## 2023-09-04 LAB — HEMOGLOBIN A1C: Hgb A1c MFr Bld: 5.6 % (ref 4.6–6.5)

## 2023-09-08 ENCOUNTER — Ambulatory Visit: Payer: Self-pay | Admitting: Internal Medicine

## 2023-09-09 ENCOUNTER — Encounter: Payer: Self-pay | Admitting: Internal Medicine

## 2023-09-09 ENCOUNTER — Ambulatory Visit (INDEPENDENT_AMBULATORY_CARE_PROVIDER_SITE_OTHER): Admitting: Internal Medicine

## 2023-09-09 VITALS — BP 128/72 | HR 74 | Resp 16 | Ht 63.0 in | Wt 159.2 lb

## 2023-09-09 DIAGNOSIS — Z0000 Encounter for general adult medical examination without abnormal findings: Secondary | ICD-10-CM

## 2023-09-09 DIAGNOSIS — R739 Hyperglycemia, unspecified: Secondary | ICD-10-CM

## 2023-09-09 DIAGNOSIS — E7800 Pure hypercholesterolemia, unspecified: Secondary | ICD-10-CM

## 2023-09-09 DIAGNOSIS — R32 Unspecified urinary incontinence: Secondary | ICD-10-CM

## 2023-09-09 DIAGNOSIS — D869 Sarcoidosis, unspecified: Secondary | ICD-10-CM

## 2023-09-09 DIAGNOSIS — Z713 Dietary counseling and surveillance: Secondary | ICD-10-CM

## 2023-09-09 DIAGNOSIS — Z860100 Personal history of colon polyps, unspecified: Secondary | ICD-10-CM

## 2023-09-09 MED ORDER — ESCITALOPRAM OXALATE 20 MG PO TABS: 20.0000 mg | ORAL_TABLET | Freq: Every day | ORAL | 1 refills | Status: DC

## 2023-09-09 MED ORDER — ROSUVASTATIN CALCIUM 10 MG PO TABS: 10.0000 mg | ORAL_TABLET | Freq: Every evening | ORAL | 1 refills | Status: DC

## 2023-09-09 MED ORDER — TIRZEPATIDE-WEIGHT MANAGEMENT 5 MG/0.5ML ~~LOC~~ SOLN: 5.0000 mg | SUBCUTANEOUS | 2 refills | Status: DC

## 2023-09-09 MED ORDER — RABEPRAZOLE SODIUM 20 MG PO TBEC: 20.0000 mg | DELAYED_RELEASE_TABLET | Freq: Two times a day (BID) | ORAL | 1 refills | Status: DC

## 2023-09-09 MED ORDER — ESTRADIOL 1 MG PO TABS: 1.0000 mg | ORAL_TABLET | Freq: Every day | ORAL | 1 refills | Status: AC
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
## 2023-09-09 NOTE — Assessment & Plan Note (Signed)
 Physical today 09/08/23.   Mammogram 12/24/22 - birads I. Colonoscopy 07/20/20 - polyp (hepatic flexure) - tubular adenoma. Recommended f/u colonoscopy in 7 years.

## 2023-09-09 NOTE — Telephone Encounter (Signed)
 Please notify - Makynli what [name]'s response. See attached note.

## 2023-09-09 NOTE — Telephone Encounter (Signed)
 [name], can you help with this.  Christy Escobar is on lexapro  and trazodone  - doing well. She reports she is getting an extra charge because it was in her 05/2023 office note (diagnosis). She is stable on the medication. I made no changes, but she is on the lexapro  and trazodone . What do I need to do?

## 2023-09-11 NOTE — Telephone Encounter (Signed)
 Noted

## 2023-09-11 NOTE — Telephone Encounter (Signed)
 I called the patient's insurance carrier [employer]) and spoke to a representative on their end. She stated that the patient's provider was listed as a specialist; therefore, the patient was charged a copay as if she had seen a specialist, leaving her with an unexpected bill. The representative stated she would send the claim back to be changed. This process may take from 14 to 30 days. The reference number is [id number]. I have notified the patient of the details written in this note.

## 2023-09-13 ENCOUNTER — Encounter: Payer: Self-pay | Admitting: Internal Medicine

## 2023-09-13 NOTE — Assessment & Plan Note (Signed)
Colonoscopy 07/20/20 - polyp (hepatic flexure) - tubular adenoma.  Recommended f/u colonoscopy in 7 years.   

## 2023-09-13 NOTE — Assessment & Plan Note (Signed)
 Some urinary incontinence. Also has times where she feels she is not emptying her bladder. Some increased frequency. Discussed further w/up and evaluation.

## 2023-09-13 NOTE — Assessment & Plan Note (Signed)
 Continues on zepbound . Tolerating. Wants to increase the dose. Follow. Miralax to keep bowels moving. Stay hydrated.

## 2023-09-13 NOTE — Assessment & Plan Note (Signed)
 Low carb diet and exercise. Follow met b and A1c.  Lab Results  Component Value Date   HGBA1C 5.6 09/04/2023

## 2023-09-13 NOTE — Assessment & Plan Note (Signed)
 Has been followed by pulmonary. S/p bronchoscopy (Dr Janelle).  Continue f/u with pulmoanry. Breathing stable.

## 2023-09-13 NOTE — Assessment & Plan Note (Signed)
Continue crestor.  Low cholesterol diet and exercise.  Follow lipid panel.  

## 2023-11-19 ENCOUNTER — Other Ambulatory Visit: Payer: Self-pay | Admitting: Internal Medicine

## 2023-11-19 DIAGNOSIS — F5104 Psychophysiologic insomnia: Secondary | ICD-10-CM

## 2023-12-02 ENCOUNTER — Other Ambulatory Visit: Payer: Self-pay | Admitting: Internal Medicine

## 2023-12-02 DIAGNOSIS — Z1231 Encounter for screening mammogram for malignant neoplasm of breast: Secondary | ICD-10-CM

## 2023-12-13 ENCOUNTER — Encounter: Payer: Self-pay | Admitting: Internal Medicine

## 2023-12-14 ENCOUNTER — Ambulatory Visit: Admitting: Urology

## 2023-12-14 NOTE — Telephone Encounter (Signed)
 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 Patient states she does not remember who prescribes this medication but she would like for you to prescribe it.

## 2023-12-14 NOTE — Telephone Encounter (Signed)
 It looks like this has not been prescribed since 2020. Please clarify what symptoms she is having. Acute problem?

## 2023-12-14 NOTE — Telephone Encounter (Signed)
 See previous messages. Please clarify how she has been taking valtrex.

## 2023-12-14 NOTE — Telephone Encounter (Signed)
 Previously filled by   Jaime Rosaline RAMAN, FNP   Last Visit: 09/09/2023 Next Visit: 01/14/2024

## 2024-01-04 ENCOUNTER — Other Ambulatory Visit: Payer: Self-pay | Admitting: Internal Medicine

## 2024-01-04 ENCOUNTER — Encounter: Payer: Self-pay | Admitting: Urology

## 2024-01-04 DIAGNOSIS — F5104 Psychophysiologic insomnia: Secondary | ICD-10-CM

## 2024-01-05 ENCOUNTER — Ambulatory Visit
Admission: RE | Admit: 2024-01-05 | Discharge: 2024-01-05 | Disposition: A | Discharge status: Home / Self Care | Source: Ambulatory Visit | Attending: Internal Medicine | Admitting: Internal Medicine

## 2024-01-05 DIAGNOSIS — Z1231 Encounter for screening mammogram for malignant neoplasm of breast: Secondary | ICD-10-CM | POA: Insufficient documentation

## 2024-01-08 ENCOUNTER — Other Ambulatory Visit

## 2024-01-11 ENCOUNTER — Other Ambulatory Visit (INDEPENDENT_AMBULATORY_CARE_PROVIDER_SITE_OTHER)

## 2024-01-11 ENCOUNTER — Ambulatory Visit: Payer: Self-pay | Admitting: Internal Medicine

## 2024-01-11 DIAGNOSIS — E7800 Pure hypercholesterolemia, unspecified: Secondary | ICD-10-CM

## 2024-01-11 DIAGNOSIS — R739 Hyperglycemia, unspecified: Secondary | ICD-10-CM

## 2024-01-11 LAB — HEPATIC FUNCTION PANEL
ALT: 13 U/L (ref 3–35)
AST: 19 U/L (ref 5–37)
Albumin: 4.3 g/dL (ref 3.5–5.2)
Alkaline Phosphatase: 45 U/L (ref 39–117)
Bilirubin, Direct: 0.1 mg/dL (ref 0.1–0.3)
Total Bilirubin: 0.5 mg/dL (ref 0.2–1.2)
Total Protein: 6.8 g/dL (ref 6.0–8.3)

## 2024-01-11 LAB — BASIC METABOLIC PANEL WITH GFR
BUN: 17 mg/dL (ref 6–23)
CO2: 31 meq/L (ref 19–32)
Calcium: 9.2 mg/dL (ref 8.4–10.5)
Chloride: 103 meq/L (ref 96–112)
Creatinine, Ser: 0.77 mg/dL (ref 0.40–1.20)
GFR: 76.35 mL/min
Glucose, Bld: 98 mg/dL (ref 70–99)
Potassium: 4 meq/L (ref 3.5–5.1)
Sodium: 140 meq/L (ref 135–145)

## 2024-01-11 LAB — LIPID PANEL
Cholesterol: 136 mg/dL (ref 28–200)
HDL: 44.5 mg/dL
LDL Cholesterol: 62 mg/dL (ref 10–99)
NonHDL: 91.14
Total CHOL/HDL Ratio: 3
Triglycerides: 146 mg/dL (ref 10.0–149.0)
VLDL: 29.2 mg/dL (ref 0.0–40.0)

## 2024-01-11 LAB — HEMOGLOBIN A1C: Hgb A1c MFr Bld: 5.6 % (ref 4.6–6.5)

## 2024-01-12 ENCOUNTER — Ambulatory Visit: Admitting: Internal Medicine

## 2024-01-14 ENCOUNTER — Encounter: Payer: Self-pay | Admitting: Internal Medicine

## 2024-01-14 ENCOUNTER — Ambulatory Visit: Admitting: Internal Medicine

## 2024-01-14 VITALS — BP 120/70 | HR 63 | Temp 97.6°F | Ht 63.0 in | Wt 169.4 lb

## 2024-01-14 DIAGNOSIS — F339 Major depressive disorder, recurrent, unspecified: Secondary | ICD-10-CM | POA: Diagnosis not present

## 2024-01-14 DIAGNOSIS — D869 Sarcoidosis, unspecified: Secondary | ICD-10-CM

## 2024-01-14 DIAGNOSIS — Z860100 Personal history of colon polyps, unspecified: Secondary | ICD-10-CM | POA: Diagnosis not present

## 2024-01-14 DIAGNOSIS — E7800 Pure hypercholesterolemia, unspecified: Secondary | ICD-10-CM

## 2024-01-14 DIAGNOSIS — R739 Hyperglycemia, unspecified: Secondary | ICD-10-CM

## 2024-01-14 DIAGNOSIS — Z713 Dietary counseling and surveillance: Secondary | ICD-10-CM

## 2024-01-14 DIAGNOSIS — F5104 Psychophysiologic insomnia: Secondary | ICD-10-CM

## 2024-01-14 DIAGNOSIS — K219 Gastro-esophageal reflux disease without esophagitis: Secondary | ICD-10-CM | POA: Diagnosis not present

## 2024-01-14 DIAGNOSIS — J3089 Other allergic rhinitis: Secondary | ICD-10-CM | POA: Diagnosis not present

## 2024-01-14 MED ORDER — TRAZODONE HCL 50 MG PO TABS: ORAL_TABLET | ORAL | 1 refills | Status: AC

## 2024-01-14 MED ORDER — VALACYCLOVIR HCL 1 G PO TABS: 1000.0000 mg | ORAL_TABLET | Freq: Every day | ORAL | 0 refills | Status: AC | PRN

## 2024-01-14 MED ORDER — ESCITALOPRAM OXALATE 20 MG PO TABS: 20.0000 mg | ORAL_TABLET | Freq: Every day | ORAL | 1 refills | Status: AC

## 2024-01-14 MED ORDER — PREDNISONE 10 MG PO TABS: ORAL_TABLET | ORAL | 0 refills | Status: AC

## 2024-01-14 MED ORDER — ROSUVASTATIN CALCIUM 10 MG PO TABS: 10.0000 mg | ORAL_TABLET | Freq: Every evening | ORAL | 1 refills | Status: AC

## 2024-01-14 MED ORDER — RABEPRAZOLE SODIUM 20 MG PO TBEC: 20.0000 mg | DELAYED_RELEASE_TABLET | Freq: Every day | ORAL | 1 refills | Status: AC
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
## 2024-01-18 ENCOUNTER — Encounter: Payer: Self-pay | Admitting: Internal Medicine

## 2024-01-18 ENCOUNTER — Ambulatory Visit: Admitting: Urology

## 2024-01-18 NOTE — Assessment & Plan Note (Signed)
 Continue lexapro . Taking trazodone . Helping with sleep. Appears to be doing well.

## 2024-01-18 NOTE — Assessment & Plan Note (Signed)
 No acid reflux.  On aciphex . EGD - 03/14/21 - ok per note.  No upper symptoms reported.

## 2024-01-18 NOTE — Assessment & Plan Note (Signed)
 Dr Aleskerov had recommended atroben nasal spray. Working better. Follow.

## 2024-01-18 NOTE — Assessment & Plan Note (Signed)
 Low carb diet and exercise. Follow met b and A1c. Off zepbound .  Lab Results  Component Value Date   HGBA1C 5.6 01/11/2024

## 2024-01-18 NOTE — Assessment & Plan Note (Signed)
"   Concerned regarding weight gain. Off zepbound . Costly. Discussed diet and exercise. Discussed healthy weight and wellness.  "

## 2024-01-18 NOTE — Assessment & Plan Note (Signed)
 Had f/u with Dr Parris 11/02/23 - f/u pulmonary sarcoidosis. Recommended f/u CT next year. Discussed taking a copy of her scan results to him for review.

## 2024-01-18 NOTE — Assessment & Plan Note (Signed)
Colonoscopy 07/20/20 - polyp (hepatic flexure) - tubular adenoma.  Recommended f/u colonoscopy in 7 years.   

## 2024-01-18 NOTE — Assessment & Plan Note (Signed)
 Continue crestor . Low cholesterol diet and exercise. Follow lipid panel.  Lab Results  Component Value Date   CHOL 136 01/11/2024   HDL 44.50 01/11/2024   LDLCALC 62 01/11/2024   TRIG 146.0 01/11/2024   CHOLHDL 3 01/11/2024

## 2024-03-14 ENCOUNTER — Ambulatory Visit: Admitting: Urology

## 2024-05-13 ENCOUNTER — Other Ambulatory Visit

## 2024-05-19 ENCOUNTER — Ambulatory Visit: Admitting: Internal Medicine
